# Patient Record
Sex: Male | Born: 1960 | Race: Black or African American | Hispanic: No
Health system: Midwestern US, Community
[De-identification: ages and names within clinical notes are randomized; demographics above are authoritative.]

## PROBLEM LIST (undated history)

## (undated) DIAGNOSIS — K52832 Lymphocytic colitis: Secondary | ICD-10-CM

## (undated) DIAGNOSIS — R197 Diarrhea, unspecified: Secondary | ICD-10-CM

## (undated) DIAGNOSIS — R7989 Other specified abnormal findings of blood chemistry: Secondary | ICD-10-CM

## (undated) DIAGNOSIS — F1010 Alcohol abuse, uncomplicated: Secondary | ICD-10-CM

## (undated) DIAGNOSIS — K389 Disease of appendix, unspecified: Secondary | ICD-10-CM

## (undated) DIAGNOSIS — M109 Gout, unspecified: Secondary | ICD-10-CM

## (undated) DIAGNOSIS — L730 Acne keloid: Secondary | ICD-10-CM

## (undated) DIAGNOSIS — R772 Abnormality of alphafetoprotein: Secondary | ICD-10-CM

## (undated) DIAGNOSIS — G4730 Sleep apnea, unspecified: Secondary | ICD-10-CM

## (undated) DIAGNOSIS — R739 Hyperglycemia, unspecified: Principal | ICD-10-CM

## (undated) DIAGNOSIS — C61 Malignant neoplasm of prostate: Principal | ICD-10-CM

## (undated) DIAGNOSIS — E559 Vitamin D deficiency, unspecified: Secondary | ICD-10-CM

## (undated) DIAGNOSIS — E782 Mixed hyperlipidemia: Secondary | ICD-10-CM

## (undated) DIAGNOSIS — K859 Acute pancreatitis without necrosis or infection, unspecified: Secondary | ICD-10-CM

## (undated) DIAGNOSIS — K449 Diaphragmatic hernia without obstruction or gangrene: Secondary | ICD-10-CM

## (undated) DIAGNOSIS — Z0000 Encounter for general adult medical examination without abnormal findings: Secondary | ICD-10-CM

## (undated) DIAGNOSIS — E785 Hyperlipidemia, unspecified: Secondary | ICD-10-CM

## (undated) DIAGNOSIS — K852 Alcohol induced acute pancreatitis without necrosis or infection: Secondary | ICD-10-CM

## (undated) DIAGNOSIS — I10 Essential (primary) hypertension: Secondary | ICD-10-CM

## (undated) DIAGNOSIS — R748 Abnormal levels of other serum enzymes: Secondary | ICD-10-CM

---

## 2008-12-29 LAB — CBC WITH MANUAL DIFFERENTIAL
Basophils %: 0.6 % (ref 0–3)
Basophils Absolute: 0 10*3/uL (ref 0.0–0.2)
Eosinophils %: 5.3 % (ref 0–7)
Eosinophils Absolute: 0.3 10*3/uL (ref 0.0–0.4)
Hematocrit: 42.1 % (ref 36–50)
Hemoglobin: 13.8 g/dL (ref 12.5–17.0)
Lymphocytes %: 38.1 % (ref 14–46)
Lymphocytes Absolute: 2.1 10*3/uL (ref 0.7–4.5)
MCH: 32 pg (ref 27–34)
MCHC: 33 g/dL (ref 32–36)
MCV: 98 fL (ref 80–98)
Monocytes %: 7.5 % (ref 4–13)
Monocytes Absolute: 0.4 10*3/uL (ref 0.1–1.0)
Neutrophils Absolute: 2.6 10*3/uL (ref 1.8–7.8)
Platelets: 250 10*3/uL (ref 140–415)
RBC: 4.32 x10E6/uL (ref 4.1–5.6)
RDW: 14.2 % (ref 11.7–15.0)
Segs Relative: 48.5 % (ref 40–74)
White Blood Cells: 5.4 10*3/uL (ref 4.0–10.5)

## 2008-12-29 LAB — COMPREHENSIVE METABOLIC PANEL
ALT: 46 U/L (ref 0–55)
AST: 52 U/L — ABNORMAL HIGH (ref 0–40)
Albumin/Globulin Ratio: 1.1 (ref 1.1–2.5)
Albumin: 4.3 g/dL (ref 3.5–5.5)
Alkaline Phosphatase: 59 U/L
BUN/Creatinine Ratio: 12 (ref 8–27)
BUN: 11 mg/dL (ref 5–26)
CO2: 19 mmol/L — ABNORMAL LOW (ref 20–32)
Calcium: 8.6 mg/dL
Chloride: 103 mmol/L (ref 97–108)
Creatinine: 0.91 mg/dL (ref 0.76–1.27)
GFR, Estimated: 108.55 mL/min (ref >59)
GFR, Estimated: 89.56 mL/min
Globulin: 4 g/dL (ref 1.5–4.5)
Glucose: 84 mg/dL (ref 65–99)
Potassium: 4.4 mmol/L (ref 3.5–5.2)
Sodium: 136 mmol/L (ref 135–145)
Total Bilirubin: 0.2 mg/dL (ref 0.1–1.2)
Total Protein: 8.3 g/dL (ref 6.0–8.5)

## 2008-12-29 LAB — LIPID PANEL
Cholesterol, Total: 149 mg/dL (ref 100–199)
HDL: 47 mg/dL — ABNORMAL LOW
LDL Calculated: 75 mg/dL (ref 0.0–99.0)
Triglycerides: 135 mg/dL (ref 0–149)
VLDL Cholesterol Calculated: 27 mg/dL (ref 5.0–40.0)

## 2008-12-29 LAB — URINALYSIS
Bilirubin, Urine: NEGATIVE
Blood, Urine: NEGATIVE
Glucose, UA: NEGATIVE mg/dL
Ketones, Urine: NEGATIVE mg/dL
Leukocyte Esterase, Urine: NEGATIVE
Nitrite, Urine: NEGATIVE
Protein, UA: NEGATIVE mg/dL
Specific Gravity, UA: 1.025 (ref 1.005–1.03)
Urobilinogen, Urine: 0.2 mg/dL (ref 0–1)
pH, UA: 5.5 (ref 5.0–7.5)

## 2008-12-29 LAB — TSH: Thyrotropin: 1.92 u[IU]/mL (ref 0.450–4.50)

## 2008-12-29 LAB — EPSTEIN BARR VIRUS PCR

## 2008-12-29 LAB — PSA: PSA: 3.1 ng/mL (ref 0.0–4.0)

## 2009-01-03 ENCOUNTER — Encounter

## 2009-01-05 NOTE — Telephone Encounter (Signed)
error 

## 2009-03-15 NOTE — Assessment & Plan Note (Signed)
Continue current meds and return in 3 mo.

## 2009-03-15 NOTE — Assessment & Plan Note (Signed)
Continue current meds no sx

## 2009-03-15 NOTE — Assessment & Plan Note (Addendum)
BP up a little;e as is pulse EKG OK add metoprolol

## 2009-03-15 NOTE — Progress Notes (Signed)
Subjective:      Patient ID: Phillip Harper is a 48 y.o. male.    HPI Comments: Here today for complete physical and review of listed chronic problem  No new c/o     Hypertension    Hyperlipidemia        Review of Systems   Constitutional: Negative.    HENT: Negative.    Eyes: Negative.         Glasses   Respiratory: Negative.    Cardiovascular: Negative.    Gastrointestinal: Negative.    Genitourinary: Positive for urgency and frequency.   Musculoskeletal: Negative.         Gout is stable since taking allopurinol    Skin: Negative.    Neurological: Negative.    Psychiatric/Behavioral: Negative.        Objective:   Physical Exam   Constitutional: He is oriented to person, place, and time. He appears well-developed and well-nourished.   HENT:   Head: Normocephalic and atraumatic.   Right Ear: External ear normal.   Left Ear: External ear normal.   Nose: Nose normal.   Mouth/Throat: Oropharynx is clear and moist.   Eyes: Conjunctivae and extraocular motions are normal. Pupils are equal, round, and reactive to light.   Neck: Normal range of motion. Neck supple. No tracheal deviation present. No thyromegaly present.   Cardiovascular: Normal rate, regular rhythm, normal heart sounds and intact distal pulses.    No murmur heard.  Pulmonary/Chest: Effort normal and breath sounds normal.   Abdominal: Soft. Bowel sounds are normal.        Obese    Genitourinary: Rectum normal, prostate normal and penis normal. Guaiac negative stool.   Musculoskeletal: Normal range of motion.   Neurological: He is alert and oriented to person, place, and time. He has normal reflexes.   Skin: Skin is warm and dry.   Psychiatric: He has a normal mood and affect. His behavior is normal.       Assessment:              Plan:        Gout - Deloria Lair, MD  03/15/09 01:55 PM  Signed  Continue current meds no sx     HTN (HYPERTENSION) - Deloria Lair, MD  03/15/09 02:05 PM  Addended  BP up a little;e as is pulse EKG OK add metoprolol          HYPERLIPIDEMIA - Deloria Lair, MD  03/15/09 01:55 PM  Signed  Continue current meds and return in 3 mo.        Well Adult Exam - Deloria Lair, MD  03/15/09 01:55 PM  Signed  within normal limits for age

## 2009-03-15 NOTE — Assessment & Plan Note (Signed)
within normal limits for age

## 2009-05-02 NOTE — Telephone Encounter (Signed)
Patient informed that we are working on the PA for the Benicar 40

## 2009-05-02 NOTE — Telephone Encounter (Signed)
Having problems getting his BP rx filled, inhsurance wants him to try  Another drug instead of benecar 40mg .[facility] [phone]

## 2009-06-16 NOTE — Assessment & Plan Note (Signed)
Continue current meds and return in 3 mo.

## 2009-06-16 NOTE — Assessment & Plan Note (Signed)
4587*no symptoms Continue current meds

## 2009-06-16 NOTE — Assessment & Plan Note (Signed)
Continue current meds and return in 3 mo.    has been off benicar due to ins will change as noted

## 2009-06-16 NOTE — Progress Notes (Signed)
Subjective:      Patient ID: Phillip Harper is a 48 y.o. male.    HPI Comments: Here today for follow up of chronic problems see problem list, no new c/o feels good     Hypertension    Hyperlipidemia        Review of Systems   Constitutional: Positive for unexpected weight change (up a few # ). Negative for fatigue.   HENT: Negative.    Eyes: Negative.         Glasses   Respiratory: Negative.    Cardiovascular: Negative.    Gastrointestinal: Negative.    Genitourinary: Positive for frequency.   Musculoskeletal: Negative.         Gout is stable since taking allopurinol    Skin: Negative.    Neurological: Negative.    Psychiatric/Behavioral: Negative.        Objective:   Physical Exam   Constitutional: He is oriented to person, place, and time. He appears well-developed and well-nourished.   HENT:   Head: Normocephalic and atraumatic.   Nose: Nose normal.   Mouth/Throat: Oropharynx is clear and moist.   Eyes: Conjunctivae and extraocular motions are normal. Pupils are equal, round, and reactive to light.   Neck: Normal range of motion. Neck supple. No tracheal deviation present. No thyromegaly present.   Cardiovascular: Normal rate, regular rhythm, normal heart sounds and intact distal pulses.    No murmur heard.  Pulmonary/Chest: Effort normal and breath sounds normal.   Abdominal: Soft. Bowel sounds are normal.        Obese    Genitourinary: Rectum normal, prostate normal and penis normal. Guaiac negative stool.   Musculoskeletal: Normal range of motion.   Neurological: He is alert and oriented to person, place, and time. He has normal reflexes.   Skin: Skin is warm and dry.   Psychiatric: He has a normal mood and affect. His behavior is normal.       Assessment:              Plan:        Gout - Deloria Lair, MD  06/16/09 01:16 PM  Signed  4587*no symptoms Continue current meds     HTN (HYPERTENSION) - Deloria Lair, MD  06/16/09 01:17 PM  Signed  Continue current meds and return in 3 mo.    has been off  benicar due to ins will change as noted     HYPERLIPIDEMIA Deloria Lair, MD  06/16/09 01:17 PM  Signed  Continue current meds and return in 3 mo.

## 2009-09-13 LAB — CBC WITH AUTO DIFFERENTIAL
Basophils %: 0.5 % (ref 0–3)
Basophils Absolute: 0 10*3/uL (ref 0.0–0.2)
Eosinophils %: 1.9 % (ref 0–7)
Eosinophils Absolute: 0.1 10*3/uL (ref 0.0–0.4)
Hematocrit: 45.3 % (ref 36–50)
Hemoglobin: 14.9 g/dL (ref 12.5–17.0)
Lymphocytes %: 23.9 % (ref 14–46)
Lymphocytes Absolute: 1.5 10*3/uL (ref 0.7–4.5)
MCH: 33.1 pg (ref 27–34)
MCHC: 32.9 g/dL (ref 32–36)
MCV: 101 fL — ABNORMAL HIGH (ref 80–98)
Monocytes %: 8.1 % (ref 4–13)
Monocytes Absolute: 0.5 10*3/uL (ref 0.1–1.0)
Neutrophils Absolute: 4.2 10*3/uL (ref 1.8–7.8)
Platelets: 265 10*3/uL (ref 140–415)
RBC: 4.5 x10E6/uL (ref 4.1–5.6)
RDW: 13.5 % (ref 11.7–15.0)
Segs Relative: 65.4 % (ref 40–74)
White Blood Cells: 6.4 10*3/uL (ref 4.0–10.5)

## 2009-09-13 LAB — COMPREHENSIVE METABOLIC PANEL
ALT: 48 U/L (ref 0–55)
AST: 44 U/L — ABNORMAL HIGH (ref 0–40)
Albumin/Globulin Ratio: 1.2 (ref 1.1–2.5)
Albumin: 4.7 g/dL (ref 3.5–5.5)
Alkaline Phosphatase: 63 U/L (ref 25–150)
BUN/Creatinine Ratio: 13 (ref 8–27)
BUN: 12 mg/dL (ref 5–26)
CO2: 25 mmol/L (ref 20–32)
Calcium: 8.9 mg/dL (ref 8.7–10.2)
Chloride: 105 mmol/L (ref 97–108)
Creatinine: 0.94 mg/dL (ref 0.76–1.27)
GFR, Estimated: 104.26 mL/min (ref >59)
GFR, Estimated: 86.02 mL/min (ref >59)
Globulin: 3.9 g/dL (ref 1.5–4.5)
Glucose: 89 mg/dL (ref 65–99)
Potassium: 4.6 mmol/L (ref 3.5–5.2)
Sodium: 140 mmol/L (ref 135–145)
Total Bilirubin: 0.2 mg/dL (ref 0.1–1.2)
Total Protein: 8.6 g/dL — ABNORMAL HIGH (ref 6.0–8.5)

## 2009-09-13 LAB — LIPID PANEL
Cholesterol, Total: 168 mg/dL (ref 100–199)
HDL: 44 mg/dL — ABNORMAL LOW
LDL Calculated: 91 mg/dL (ref 0.0–99.0)
Triglycerides: 166 mg/dL — ABNORMAL HIGH (ref 0–149)
VLDL Cholesterol Calculated: 33 mg/dL (ref 5.0–40.0)

## 2009-09-13 LAB — TSH: Thyrotropin: 2.41 u[IU]/mL (ref 0.450–4.50)

## 2009-09-14 LAB — VITAMIN D 25 HYDROXY: Vit D, 25-Hydroxy: 4 ng/mL — ABNORMAL LOW (ref 32.0–100.0)

## 2009-09-15 NOTE — Assessment & Plan Note (Signed)
Continue current meds and return in 3 mo.

## 2009-09-15 NOTE — Assessment & Plan Note (Signed)
No change no c/o

## 2009-09-15 NOTE — Progress Notes (Signed)
Subjective:      Patient ID: Phillip Harper is a 48 y.o. male.    HPI Comments: Here today for follow up of chronic problems see problem list, no new c/o feels good     Hypertension  Pertinent negatives include no fatigue.   Hyperlipidemia        Review of Systems   Constitutional: Positive for unexpected weight change (down 4#). Negative for fatigue.   HENT: Negative.    Eyes: Negative.         Glasses   Respiratory: Negative.    Cardiovascular: Negative.    Gastrointestinal: Negative.    Genitourinary: Positive for frequency.   Musculoskeletal: Negative.         Gout is stable since taking allopurinol    Skin: Negative.    Neurological: Negative.    Psychiatric/Behavioral: Negative.        Objective:   Physical Exam   Constitutional: He is oriented to person, place, and time. He appears well-developed and well-nourished.   HENT:   Head: Normocephalic and atraumatic.   Right Ear: External ear normal.   Left Ear: External ear normal.   Nose: Nose normal.   Mouth/Throat: Oropharynx is clear and moist.   Eyes: Conjunctivae and extraocular motions are normal. Pupils are equal, round, and reactive to light.   Neck: Normal range of motion. Neck supple. No tracheal deviation present. No thyromegaly present.   Cardiovascular: Normal rate, regular rhythm, normal heart sounds and intact distal pulses.    No murmur heard.  Pulmonary/Chest: Effort normal and breath sounds normal.   Abdominal: Soft. Bowel sounds are normal.        Obese    Musculoskeletal: Normal range of motion.   Neurological: He is alert and oriented to person, place, and time. He has normal reflexes.   Skin: Skin is warm and dry.       Assessment:              Plan:        Gout - Deloria Lair, MD  09/15/09 01:13 PM  Signed  No change no c/o     HTN (HYPERTENSION) - Deloria Lair, MD  09/15/09 01:13 PM  Signed  Continue current meds and return in 3 mo.        HYPERLIPIDEMIA Deloria Lair, MD  09/15/09 01:13 PM  Signed  Continue current meds and return  in 3 mo.        Vitamin D deficiency - Deloria Lair, MD  09/15/09 01:13 PM  Signed   vitamin D is low Tx with Vit. D 50,000  Units weekly x12 then repeat labs

## 2009-09-15 NOTE — Assessment & Plan Note (Signed)
vitamin D is low Tx with Vit. D 50,000  Units weekly x12 then repeat labs

## 2009-12-11 NOTE — Assessment & Plan Note (Signed)
Finished 50,00 check level Tx as needed

## 2009-12-11 NOTE — Assessment & Plan Note (Signed)
Continue current meds and return in 3 mo.

## 2009-12-11 NOTE — Progress Notes (Signed)
Subjective:      Patient ID: Phillip Harper is a 49 y.o. male.    HPI Comments: Here today for follow up of chronic problems see problem list, no new c/o feels good     Hypertension    Hyperlipidemia        Review of Systems   Constitutional: Negative for fatigue and unexpected weight change.   HENT: Negative.    Eyes: Negative.         [glasses  Respiratory: Negative.    Cardiovascular: Negative.    Gastrointestinal: Negative.    Genitourinary: Positive for frequency.   Musculoskeletal: Positive for back pain.        [Gout is stable since taking allopurinol occ symptoms   Skin: Negative.    Neurological: Negative.    Psychiatric/Behavioral: Negative.        Objective:   Physical Exam   Constitutional: He is oriented to person, place, and time. He appears well-developed and well-nourished.   HENT:   Head: Normocephalic and atraumatic.   Right Ear: External ear normal.   Left Ear: External ear normal.   Nose: Nose normal.   Mouth/Throat: Oropharynx is clear and moist.   Eyes: Conjunctivae and EOM are normal. Pupils are equal, round, and reactive to light.   Neck: Normal range of motion. Neck supple. No tracheal deviation present. No thyromegaly present.   Cardiovascular: Normal rate, regular rhythm, normal heart sounds and intact distal pulses.    No murmur heard.  Pulmonary/Chest: Effort normal and breath sounds normal.   Abdominal: Soft. Bowel sounds are normal.        Obese    Musculoskeletal: Normal range of motion.   Neurological: He is alert and oriented to person, place, and time. He has normal reflexes.   Skin: Skin is warm and dry.       Assessment:              Plan:        Gout  occ symptoms prn meds as noted     HYPERLIPIDEMIA  Continue current meds and return in 3 mo.        HTN (HYPERTENSION)  Continue current meds and return in 3 mo.        Vitamin D deficiency  Finished 50,00 check level Tx as needed

## 2009-12-11 NOTE — Assessment & Plan Note (Signed)
occ symptoms prn meds as noted

## 2009-12-12 LAB — CBC WITH DIFFERENTIAL
Basophils %: 0.8 %
Basophils Absolute: 38 cells/uL (ref 0–200)
Eosinophils %: 4.3 %
Eosinophils Absolute: 206 cells/uL (ref 15–500)
Hematocrit: 41.6 % (ref 38.5–50.0)
Hemoglobin: 14.4 g/dL (ref 13.2–17.1)
Lymphocytes %: 31 %
Lymphocytes Absolute: 1488 cells/uL (ref 850–3900)
MCH: 34.4 pg — ABNORMAL HIGH (ref 27.0–33.0)
MCHC: 34.6 g/dL (ref 32.0–36.0)
MCV: 99.6 fL (ref 80.0–100.0)
Monocytes %: 7.8 %
Monocytes Absolute: 374 cells/uL (ref 200–950)
Neutrophils Absolute: 2693 cells/uL (ref 1500–7800)
Platelets: 194 10*3/uL (ref 140–400)
RBC: 4.18 10*6/uL — ABNORMAL LOW (ref 4.20–5.80)
RDW: 14.1 % (ref 11.0–15.0)
Segs Relative: 56.1 %
White Blood Cells: 4.8 10*3/uL (ref 3.8–10.8)

## 2009-12-12 LAB — COMPREHENSIVE METABOLIC PANEL
ALT: 46 U/L (ref 9–60)
AST: 72 U/L — ABNORMAL HIGH (ref 10–40)
Albumin/Globulin Ratio: 1.2 (calc) (ref 1.0–2.1)
Albumin: 4.3 g/dL (ref 3.6–5.1)
Alkaline Phosphatase: 58 U/L (ref 40–115)
BUN: 10 mg/dL (ref 7–25)
CO2: 24 mmol/L (ref 21–33)
Calcium: 8.9 mg/dL (ref 8.6–10.2)
Chloride: 108 mmol/L (ref 98–110)
Creatinine: 0.95 mg/dL (ref 0.78–1.34)
Est, Glom Filt Rate: 95 mL/min/{1.73_m2} (ref >=60)
Globulin: 3.7 g/dL (calc) (ref 2.1–3.7)
Glucose: 89 mg/dL (ref 65–99)
Potassium: 4.4 mmol/L (ref 3.5–5.3)
Sodium: 140 mmol/L (ref 135–146)
Total Bilirubin: 0.5 mg/dL (ref 0.2–1.2)
Total Protein: 8 g/dL (ref 6.2–8.3)
eGFR African American: 110 mL/min/{1.73_m2} (ref >=60)

## 2009-12-12 LAB — VITAMIN D 25 HYDROXY
25-Hydroxy Vitamin D-2: 39 ng/mL
25-Hydroxy Vitamin D-3: <4 ng/mL
Vit D, 25-Hydroxy: 39 ng/mL (ref 30–100)

## 2009-12-12 LAB — LIPID PANEL
Chol/HDL Ratio: 2.8 (calc) (ref <=5.0)
Cholesterol, Total: 121 mg/dL — ABNORMAL LOW (ref 125–200)
HDL: 44 mg/dL (ref >=40)
LDL Calculated: 45 mg/dL (calc) (ref <130)
Triglycerides: 161 mg/dL — ABNORMAL HIGH (ref <150)

## 2010-03-12 NOTE — Assessment & Plan Note (Signed)
Will check with cpe

## 2010-03-12 NOTE — Assessment & Plan Note (Signed)
Continue current meds and return in 3 mo.    cpe

## 2010-03-12 NOTE — Progress Notes (Signed)
Subjective:      Patient ID: Phillip Harper is a 49 y.o. male.    HPI Comments: Here today for follow up of chronic problems see problem list, no new c/o feels good     Hypertension    Hyperlipidemia        Review of Systems   Constitutional: Negative for fatigue and unexpected weight change (up a few # ).   HENT: Negative.    Eyes: Negative.         [glasses  Respiratory: Negative.    Cardiovascular: Negative.    Gastrointestinal: Negative.    Genitourinary: Positive for frequency.   Musculoskeletal: Positive for back pain.        [Gout is stable since taking allopurinol occ symptoms   Skin: Negative.    Neurological: Negative.    Psychiatric/Behavioral: Negative.        Objective:   Physical Exam   Constitutional: He is oriented to person, place, and time. He appears well-developed and well-nourished.   HENT:   Head: Normocephalic and atraumatic.   Right Ear: External ear normal.   Left Ear: External ear normal.   Nose: Nose normal.   Mouth/Throat: Oropharynx is clear and moist.   Eyes: Conjunctivae and EOM are normal. Pupils are equal, round, and reactive to light.   Neck: Normal range of motion. Neck supple. No tracheal deviation present. No thyromegaly present.   Cardiovascular: Normal rate, regular rhythm, normal heart sounds and intact distal pulses.    No murmur heard.  Pulmonary/Chest: Effort normal and breath sounds normal.   Abdominal:             Musculoskeletal: Normal range of motion.   Neurological: He is alert and oriented to person, place, and time. He has normal reflexes.   Skin: Skin is warm and dry.       Assessment:        Gout  No recent attacks Continue current meds     HTN (HYPERTENSION)  Continue current meds and return in 3 mo.    cpe     HYPERLIPIDEMIA  Continue current meds and return in 3 mo.    cpe     Vitamin D deficiency  Will check with cpe              Plan:

## 2010-03-12 NOTE — Assessment & Plan Note (Signed)
No recent attacks Continue current meds

## 2010-06-12 LAB — COMPREHENSIVE METABOLIC PANEL
ALT: 62 U/L — ABNORMAL HIGH (ref 10–40)
AST: 79 U/L — ABNORMAL HIGH (ref 15–37)
Albumin/Globulin Ratio: 1.2 (ref 1.1–2.2)
Albumin: 4.5 gm/dl (ref 3.4–5.0)
Alkaline Phosphatase: 70 U/L (ref 45–129)
BUN: 11 mg/dl (ref 7–18)
CO2: 24 mEq/L (ref 21–32)
Calcium: 9.1 mg/dl (ref 8.3–10.6)
Chloride: 106 mEq/L (ref 99–110)
Creatinine: 0.9 mg/dl (ref 0.9–1.3)
GFR Est, African/Amer: >60
GFR, Estimated: >60 (ref >60)
Glucose: 89 mg/dl (ref 70–99)
Potassium: 4.3 mEq/L (ref 3.5–5.1)
Sodium: 140 mEq/L (ref 136–145)
Total Bilirubin: 0.3 mg/dl (ref 0.0–1.0)
Total Protein: 8.3 gm/dl — ABNORMAL HIGH (ref 6.4–8.2)

## 2010-06-12 LAB — URINALYSIS
Bilirubin, Urine: NEGATIVE
Blood, Urine: NEGATIVE
Glucose, UA: NEGATIVE mg/dl
Ketones, Urine: NEGATIVE mg/dl
Nitrite, Urine: NEGATIVE
Protein, UA: NEGATIVE mg/dl
Specific Gravity, UA: 1.025 (ref 1.005–1.030)
Urobilinogen, Urine: 0.2 EU/dl (ref <2.0)
pH, UA: 5.5 (ref 4.5–8.0)

## 2010-06-12 LAB — LIPID PANEL
Cholesterol, Total: 118 mg/dl (ref <200)
HDL: 43 mg/dl (ref 40–60)
LDL Calculated: 48 mg/dl (ref <100)
Triglycerides: 133 mg/dl (ref <150)
VLDL Cholesterol Calculated: 27 mg/dl

## 2010-06-12 LAB — CBC
Hematocrit: 38.8 % — ABNORMAL LOW (ref 40.5–52.5)
Hemoglobin: 13.4 gm/dl — ABNORMAL LOW (ref 13.5–17.5)
MCH: 34.8 pg — ABNORMAL HIGH (ref 26–34)
MCHC: 34.5 gm/dl (ref 31–36)
MCV: 100.8 fl — ABNORMAL HIGH (ref 80–100)
MPV: 8.2 fl (ref 5.0–10.5)
Platelets: 213 10*3 (ref 135–450)
RBC: 3.85 10*6 — ABNORMAL LOW (ref 4.2–5.9)
RDW: 13.8 % (ref 11.5–14.5)
WBC: 6.4 10*3 (ref 4.0–11.0)

## 2010-06-12 LAB — TSH: TSH: 2.77 u[IU]/mL (ref 0.35–5.5)

## 2010-06-12 LAB — PSA PROSTATIC SPECIFIC ANTIGEN: PSA: 2.84 ng/ml

## 2010-06-12 NOTE — Progress Notes (Signed)
Subjective:      Patient ID: Phillip Harper is a 49 y.o. male.    HPI Comments: Here today for complete physical and review of listed chronic problem       Review of Systems   Constitutional: Positive for unexpected weight change (up9#).   HENT: Negative.    Eyes: Negative.         Glasses   Respiratory: Positive for shortness of breath (mild DOE).    Cardiovascular: Negative.    Gastrointestinal: Negative.    Genitourinary: Positive for urgency and frequency.   Musculoskeletal: Negative.         Gout is stable since taking allopurinol    Skin: Negative.    Neurological: Negative.    Hematological: Negative.    Psychiatric/Behavioral: Negative.        Objective:   Physical Exam   Constitutional: He is oriented to person, place, and time. He appears well-developed and well-nourished.   HENT:   Head: Normocephalic and atraumatic.   Right Ear: External ear normal.   Left Ear: External ear normal.   Nose: Nose normal.   Mouth/Throat: Oropharynx is clear and moist.   Eyes: Conjunctivae and EOM are normal. Pupils are equal, round, and reactive to light.   Neck: Normal range of motion. Neck supple. No tracheal deviation present. No thyromegaly present.   Cardiovascular: Normal rate, regular rhythm, normal heart sounds and intact distal pulses.    No murmur heard.  Pulmonary/Chest: Effort normal and breath sounds normal.   Abdominal: Soft. Bowel sounds are normal.        Obese    Genitourinary: Rectum normal, prostate normal and penis normal. Guaiac negative stool.   Musculoskeletal: Normal range of motion.   Neurological: He is alert and oriented to person, place, and time. He has normal reflexes.   Skin: Skin is warm and dry.   Psychiatric: He has a normal mood and affect. His behavior is normal.       Assessment:              Plan:        Vitamin D deficiency  Stable otc med    HYPERLIPIDEMIA  Continue current meds and return in 3 mo.        HTN (HYPERTENSION)  Continue current meds and return in 3 mo.         Gout  Continue current meds no recent attacks

## 2010-06-12 NOTE — Assessment & Plan Note (Signed)
Continue current meds and return in 3 mo.

## 2010-06-12 NOTE — Assessment & Plan Note (Signed)
Stable otc med

## 2010-06-12 NOTE — Assessment & Plan Note (Signed)
Continue current meds no recent attacks

## 2010-06-13 LAB — VITAMIN D 25 HYDROXY: Vit D, 25-Hydroxy: 12 ng/ml — ABNORMAL LOW (ref 30–80)

## 2010-06-18 MED ORDER — VITAMIN D (ERGOCALCIFEROL) 1.25 MG (50000 UT) PO CAPS
1.25 MG (50000 UT) | ORAL_CAPSULE | ORAL | Status: DC
Start: 2010-06-18 — End: 2010-11-07

## 2010-07-20 MED ORDER — LISINOPRIL 20 MG PO TABS
20 MG | ORAL_TABLET | ORAL | Status: DC
Start: 2010-07-20 — End: 2010-08-02

## 2010-07-26 ENCOUNTER — Inpatient Hospital Stay
Admission: EM | Admit: 2010-07-26 | Disposition: A | Discharge status: Home / Self Care | Source: Home / Self Care | Attending: Emergency Medicine | Admitting: Emergency Medicine

## 2010-07-26 DIAGNOSIS — K852 Alcohol induced acute pancreatitis without necrosis or infection: Secondary | ICD-10-CM

## 2010-07-26 LAB — COMPREHENSIVE METABOLIC PANEL
1/Creatinine: 1.18 ratio
ALT: 232 U/L — ABNORMAL HIGH (ref 9–60)
AST: 309 U/L — ABNORMAL HIGH (ref 17–59)
Albumin: 4.9 g/dL (ref 3.6–5.1)
Alkaline Phosphatase: 143 U/L — ABNORMAL HIGH (ref 40–115)
Anion Gap: 19 mmol/L — ABNORMAL HIGH (ref 3–16)
BUN: 14 mg/dL (ref 7–25)
CO2: 22 mmol/L (ref 21–33)
Calcium: 9.6 mg/dL (ref 8.6–10.2)
Chloride: 104 mmol/L (ref 98–110)
Creatinine: 0.85 mg/dL (ref 0.50–1.30)
GFR Est, African/Amer: 116 See note.
GFR Est: 96 See note.
Glucose: 165 mg/dL — ABNORMAL HIGH (ref 65–99)
Potassium: 4.4 mmol/L (ref 3.5–5.3)
Sodium: 145 mmol/L (ref 135–146)
Total Bilirubin: 0.8 mg/dL (ref 0.2–1.2)
Total Protein: 9.1 g/dL — ABNORMAL HIGH (ref 6.2–8.3)

## 2010-07-26 LAB — CBC WITH AUTO DIFFERENTIAL
Basophils %: 0 % (ref 0.0–1.0)
Basophils Absolute: 5 /uL (ref 0–200)
Eosinophils %: 0 % (ref 0.0–8.0)
Eosinophils Absolute: 2 /uL — ABNORMAL LOW (ref 15–500)
Hematocrit: 44.1 % (ref 38.5–50.0)
Hemoglobin: 15.4 g/dL (ref 13.2–17.1)
Lymphocytes %: 3.8 % — ABNORMAL LOW (ref 15.0–45.0)
Lymphocytes Absolute: 483 /uL — ABNORMAL LOW (ref 850–3900)
MCH: 33.8 pg — ABNORMAL HIGH (ref 27.0–33.0)
MCHC: 35 g/dL (ref 32.0–36.0)
MCV: 96.3 fL (ref 80.0–100.0)
MPV: 8.5 fL (ref 7.5–11.5)
Monocytes %: 2.1 % (ref 0.0–12.0)
Monocytes Absolute: 271 /uL (ref 200–950)
Neutrophils Absolute: 11904 /uL — ABNORMAL HIGH (ref 1500–7800)
Platelets: 209 10*3/uL (ref 140–400)
RBC: 4.58 10*6/uL (ref 4.20–5.80)
RDW: 14.1 % (ref 11.0–15.0)
Segs Relative: 94.1 % — ABNORMAL HIGH (ref 40.0–80.0)
WBC: 12.6 10*3/uL — ABNORMAL HIGH (ref 3.8–10.8)

## 2010-07-26 LAB — POCT CALCIUM IONIZED: POC Ionized Calcium: 4.5 mg/dL (ref 4.5–5.3)

## 2010-07-26 LAB — HEPATIC FUNCTION PANEL
ALT: 230 U/L — ABNORMAL HIGH (ref 9–60)
AST: 309 U/L — ABNORMAL HIGH (ref 17–59)
Albumin: 5 g/dL (ref 3.6–5.1)
Alkaline Phosphatase: 148 U/L — ABNORMAL HIGH (ref 40–115)
Bilirubin, Direct: 0 mg/dL (ref 0.0–0.2)
Total Bilirubin: 0.8 mg/dL (ref 0.2–1.2)
Total Protein: 9.3 g/dL — ABNORMAL HIGH (ref 6.2–8.3)

## 2010-07-26 LAB — POCT SODIUM: POC Sodium: 140 mmol/L (ref 135–146)

## 2010-07-26 LAB — POC ANION GAP: POC Anion Gap: 16 (ref 10–20)

## 2010-07-26 LAB — LIPASE: Lipase: 5024 U/L — ABNORMAL HIGH (ref 23–300)

## 2010-07-26 LAB — POCT POTASSIUM: POC Potassium: 3.9 mmol/L (ref 3.5–5.3)

## 2010-07-26 LAB — POC TOTAL CO2 MEASURED: POC TCO2 Measured: 23 mmol/L (ref 21–32)

## 2010-07-26 LAB — POCT GLUCOSE: POC Glucose: 169 mg/dL — ABNORMAL HIGH (ref 65–99)

## 2010-07-26 LAB — POCT CREATININE: POC Creatinine: 0.8 mg/dL (ref 0.6–1.3)

## 2010-07-26 LAB — POCT UREA (BUN): POC BUN: 12 mg/dL (ref 7–25)

## 2010-07-26 LAB — POCT CHLORIDE: POC Chloride: 106 mmol/L (ref 98–110)

## 2010-07-26 LAB — TRIGLYCERIDES: Triglycerides: 102 mg/dL (ref 30–150)

## 2010-07-26 MED ORDER — HYDROMORPHONE HCL 2 MG/ML IJ SOLN
2 MG/ML | Freq: Once | INTRAMUSCULAR | Status: AC
Start: 2010-07-26 — End: 2010-07-26
  Administered 2010-07-26: 19:00:00 via INTRAVENOUS

## 2010-07-26 MED ORDER — SODIUM CHLORIDE 0.9 % IV SOLN
0.9 % | INTRAVENOUS | Status: DC
Start: 2010-07-26 — End: 2010-07-26
  Administered 2010-07-26: 17:00:00 via INTRAVENOUS

## 2010-07-26 MED ORDER — ONDANSETRON HCL 4 MG/2ML IJ SOLN
4 MG/2ML | Freq: Once | INTRAMUSCULAR | Status: AC
Start: 2010-07-26 — End: 2010-07-26
  Administered 2010-07-26: 17:00:00 via INTRAVENOUS

## 2010-07-26 MED ORDER — FENTANYL CITRATE 0.05 MG/ML IJ SOLN
0.05 MG/ML | Freq: Once | INTRAMUSCULAR | Status: AC
Start: 2010-07-26 — End: 2010-07-26
  Administered 2010-07-26: 17:00:00 via INTRAVENOUS

## 2010-07-26 MED ADMIN — banana bag IV soln: INTRAVENOUS | @ 22:00:00 | NDC 63323018410

## 2010-07-26 MED ADMIN — 0.9%  NaCl infusion: INTRAVENOUS | NDC 00409798348

## 2010-07-26 MED ADMIN — allopurinol (ZYLOPRIM) tablet 300 mg: ORAL | @ 22:00:00 | NDC 51079020601

## 2010-07-26 MED ADMIN — metoprolol (TOPROL-XL) XL tablet 50 mg: ORAL | @ 22:00:00 | NDC 68084030411

## 2010-07-26 MED ADMIN — amlodipine (NORVASC) tablet 5 mg: ORAL | @ 22:00:00 | NDC 51079045101

## 2010-07-26 MED ADMIN — hydrALAZINE (APRESOLINE) tablet 25 mg: ORAL | NDC 63739012610

## 2010-07-26 MED ADMIN — morphine injection 2 mg: 2 mg | INTRAVENOUS | @ 22:00:00 | NDC 00409176230

## 2010-07-26 MED FILL — ONDANSETRON HCL 4 MG/2ML IJ SOLN: 4 MG/2ML | INTRAMUSCULAR | Qty: 2

## 2010-07-26 MED FILL — INDOMETHACIN 25 MG PO CAPS: 25 MG | ORAL | Qty: 1

## 2010-07-26 MED FILL — HYDRALAZINE HCL 25 MG PO TABS: 25 MG | ORAL | Qty: 1

## 2010-07-26 MED FILL — ALLOPURINOL 300 MG PO TABS: 300 MG | ORAL | Qty: 1

## 2010-07-26 MED FILL — FOLIC ACID 5 MG/ML IJ SOLN: 5 MG/ML | INTRAMUSCULAR | Qty: 0.2

## 2010-07-26 MED FILL — FENTANYL CITRATE 0.05 MG/ML IJ SOLN: 0.05 MG/ML | INTRAMUSCULAR | Qty: 2

## 2010-07-26 MED FILL — HYDROMORPHONE HCL 1 MG/ML IJ SOLN: 1 MG/ML | INTRAMUSCULAR | Qty: 2

## 2010-07-26 MED FILL — COLCHICINE 0.6 MG PO TABS: 0.6 MG | ORAL | Qty: 1

## 2010-07-26 MED FILL — MORPHINE SULFATE 2 MG/ML IJ SOLN: 2 mg/mL | INTRAMUSCULAR | Qty: 1

## 2010-07-26 MED FILL — AMLODIPINE BESYLATE 5 MG PO TABS: 5 MG | ORAL | Qty: 1

## 2010-07-26 NOTE — H&P (Addendum)
Department of Internal Medicine  General Internal Medicine  Resident History and Physical      CHIEF COMPLAINT:  Abdominal pain    Reason for Admission:  Pancreatitis    History Obtained From:  patient    HISTORY OF PRESENT ILLNESS:      The patient is a 49 y.o. male with significant past medical history of HTN, HLD and gout who presents with upper abdominal pain. Patient states that about 3 months ago he started having sensations of fullness and bloating but no pain. But last night after dinner he developed sharp aching pain in his epigastrium and in left upper quadrant. Pain was constant, 10/10, radiating to back without any aggravating or relieving factors. He also felt nauseated and vomited 5 times overnight. Vomitus contained gastric contents and straw colored fluid without blood. Didn't take anything since last night. Never had pancreatitis before.    Also complains of chills and sweats since the pain started. Does not have palpitations, tremors, chest pain or diarrhea. Denies cough, dizziness, headache or swelling of his legs. CAGE questionnaire is positive for 2. Drinks 6 packs of beer every other day. Last drink was 2 days ago. Never had alcohol withdrawal.    Past Medical History:        Diagnosis Date   ??? Gout    ??? Hyperlipidemia    ??? HTN (hypertension)    ??? Keloids 12/2003     multiple keloids on head   ??? Vitamin D deficiency      Past Surgical History:    History reviewed. No pertinent past surgical history.      Medications Prior to Admission:    Prescriptions prior to admission:lisinopril (PRINIVIL;ZESTRIL) 20 MG tablet, TAKE 1 TABLET BY MOUTH EVERY DAY vitamin D (ERGOCALCIFEROL) 50000 UNIT CAPS capsule, Take 1 capsule by mouth once a week. allopurinol (ZYLOPRIM) 300 MG tablet, TAKE 1 TABLET BY MOUTH EVERY DAY metoprolol (TOPROL-XL) 50 MG XL tablet, TAKE 1 TABLET BY MOUTH NIGHTLY amlodipine (NORVASC) 5 MG tablet, TAKE 1 TABLET BY MOUTH EVERY DAY  simvastatin (ZOCOR) 40 MG tablet, TAKE 1 TABLET BY  MOUTH AT BEDTIME colchicine 0.6 MG tablet, Take 0.6 mg by mouth daily. indomethacin (INDOCIN) 25 MG capsule, Take 25 mg by mouth 2 times daily (with meals).    Allergies:  Review of patient's allergies indicates no known allergies.    Social History:   Separated. Lives alone. 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 Drinks 6 packs of beer every 2 days. Does not smoke.    Family History:   Mother died, had pancreatitis 2/2 alcohol. Father had heart disease.    REVIEW OF SYSTEMS:  CONSTITUTIONAL:  positive for  chills and sweats  EYES:  negative  HEENT:  negative  RESPIRATORY:  negative for  cough with sputum and dyspnea  CARDIOVASCULAR:  negative for  palpitations  GASTROINTESTINAL:  positive for nausea, vomiting, abdominal pain and abdominal distention  GENITOURINARY:  negative  INTEGUMENT/BREAST:  negative  HEMATOLOGIC/LYMPHATIC:  negative  ENDOCRINE:  negative  MUSCULOSKELETAL:  negative for  joint swelling  NEUROLOGICAL:  negative  BEHAVIOR/PSYCH:  negative  PHYSICAL EXAM:    Vitals:  BP 144/88   Pulse 99   Temp(Src) 98.2 ??F (36.8 ??C) (Oral)   Resp 16   Ht 5\' 9"  (1.753 m)   Wt 270 lb (122.471 kg)   BMI 39.87 kg/m2   SpO2 93%    Physical Examination:   General appearance: Obese man wearing cap, lying in bed comfortably with  wife sitting next to him. Appears comfortabl without distress. IV infusing in right arm. Appears stated age.   Skin: Normal skin turgor. No rashes or leisions.   HEENT: Head: Normocephalic. Has large keloid at occipital region.   Throat: Mucous membranes look moist. Tongue normal with no coating.  Eye: PERLA, EOM intact. No pallor or scleral icterus.  Neck: no JVD.   Lungs: CTA bilaterally, Air entry equal, no wheezes or crackles.  Heart: RRR, S1, S2 normal. No added sounds.   Abdomen: Soft but distended. Some tenderness in epigastric region an in left upper quadrant. No guarding. BS +ve. No cullen's or gary turners sign.  Extremities: extremities normal, atraumatic, no clubbing or edema. Peripheral  pulses normal.   Neurologic: Oriented x 3. Cooperative, alert. Cranial nerves II-XII intact. Gross motor and sensory system   intact.      DATA:  CBC with Differential:    Lab Results   Component Value Date    WBC 12.6 07/26/2010    RBC 4.58 07/26/2010    HGB 15.4 07/26/2010    HCT 44.1 07/26/2010    PLT 209 07/26/2010    MCV 96.3 07/26/2010    MCH 33.8 07/26/2010    MCHC 35.0 07/26/2010    RDW 14.1 07/26/2010    NRBC CANCELED 12/06/2009    NRBC CANCELED 12/06/2009    SEGSPCT 94.1 07/26/2010    BANDSPCT CANCELED 12/06/2009    BLASTSPCT CANCELED 12/06/2009    METASPCT CANCELED 12/06/2009    LYMPHOPCT 3.8 07/26/2010    PROMYELOPCT CANCELED 12/06/2009    MONOPCT 2.1 07/26/2010    MYELOPCT CANCELED 12/06/2009    EOSPCT 0.0 07/26/2010    BASOPCT 0.0 07/26/2010    MONOSABS 271 07/26/2010    LYMPHSABS 483 07/26/2010    EOSABS 2 07/26/2010    BASOSABS 5 07/26/2010     BMP:    Lab Results   Component Value Date    NA 145 07/26/2010    K 4.4 07/26/2010    CL 104 07/26/2010    CO2 22 07/26/2010    BUN 14 07/26/2010    LABALBU 5.0 07/26/2010    LABALBU 4.9 07/26/2010    CREATININE 0.8 07/26/2010    CREATININE 0.85 07/26/2010    CALCIUM 9.6 07/26/2010    GFRAA 116 07/26/2010    LABGLOM >60 06/11/2010    LABGLOM 95 12/06/2009    GLUCOSE 165 07/26/2010    GLUCOSE 89 12/06/2009     Hepatic Function Panel:    Lab Results   Component Value Date    ALKPHOS 148 07/26/2010    ALKPHOS 143 07/26/2010    ALT 230 07/26/2010    ALT 232 07/26/2010    AST 309 07/26/2010    AST 309 07/26/2010    PROT 9.3 07/26/2010    PROT 9.1 07/26/2010    BILITOT 0.8 07/26/2010    BILITOT 0.8 07/26/2010    BILIDIR 0.0 07/26/2010    LABALBU 5.0 07/26/2010    LABALBU 4.9 07/26/2010     ASSESSMENT AND PLAN:      Pancreatitis likely 2/2 alcohol use:  This is his first episode of pancreatitis. Lipase is >5000. LFT's are consistent with alcohol transaminases. USG is normal ruling out gall stones as culprits. Triglyceride in November was normal. Calcium is also normal. Offending drug  which can cause pancreatitis is lisinopril. Low ranson's score.   - Will start fluids at 359ml/hr   - Analgesics with IV morphine   - NPO   -  Hold lisinopril   - Follow lipase tomorrow.    HTN:  Continue home medicines. Hold lisinopril because of the risk of flaring up pancreatitis.   - Would add hydralazine.    Gout:  Has history of gout. Would continue with allopurinol.    Alcohol abuse:  Patient was positive to 2 questions out of 4 for CAGE. Last drink was 2 days ago. Denies any signs of withdrawal.   - Would initiate CIWA protocol.   - Banana bag.    FEN: NS @ 36ml/hr  Diet: NPO    Santiago Glad MD.    Addendum to Resident H&P:   Pt seen,examined and evaluated with the medical resident team. I have reviewed the current history, physical findings, labs and assessment and plan and agree with note as documented    In addition, pt with elevated total protein levels in setting of acute pancreatitis.  Given this, will check IgG4 level to r/o autoimmune pancreatitis.    Patient Active Problem List   Diagnoses   ??? Gout   ??? Hyperlipidemia   ??? HTN (hypertension)   ??? Vitamin D deficiency   ??? Acute alcoholic pancreatitis   ??? Alcoholic hepatitis   ??? Alcohol abuse       Dr Grandville Silos

## 2010-07-26 NOTE — ED Notes (Signed)
To U/S    Phoebe PerchStephanie Puja Caffey, RN  07/26/10 1345

## 2010-07-26 NOTE — ED Notes (Signed)
Report verified by floor RN. Transported to room 5321 via stretcher by transporter . Patient in stable condition at time of transfer.     Phoebe PerchStephanie Cyrah Mclamb, RN  07/26/10 [phone]

## 2010-07-26 NOTE — ED Notes (Signed)
AOD at bedside    Phoebe PerchStephanie Clemence Stillings, RN  07/26/10 1441

## 2010-07-26 NOTE — ED Provider Notes (Signed)
HPI  Chief Complaint:    Chief Complaint   Patient presents with   ??? Abdominal Pain   ??? Emesis       Nursing Notes, Past Medical Hx, Past Surgical Hx, Social Hx, Allergies, and Family Hx were all reviewed and agreed with or any disagreements were addressed in the HPI.    HPI:    Phillip Harper is a 49 y.o. male Complains of upper abdominal pain. Patient states approximately a month to 3 months ago patient had early sensation with eating but no actual abdominal pain. He's had progressive symptoms to now although he has much discomfort in his epigastric abdominal region. He feels generally bloated with associated nausea and vomiting starting yesterday evening this morning. Pain is constant with a crescendo decrescendo component to it. He has had chills and sweats but is unaware of he's had fever associated with this. He denies any bloody or black stools of late. He's had no prior surgical intervention of his abdomen. He denies any chest pain pressure with same.    Review of Systems   Constitutional: Positive for chills and diaphoresis.   HENT: Negative.    Eyes: Negative.    Respiratory: Negative.    Cardiovascular: Negative.    Gastrointestinal: Positive for nausea, vomiting, abdominal pain and abdominal distention. Negative for diarrhea, constipation and blood in stool.   Genitourinary: Negative.    Musculoskeletal: Negative.    Skin: Negative.    Neurological: Negative.        Physical Exam   Nursing note and vitals reviewed.  Constitutional: He is oriented to person, place, and time. He appears well-developed and well-nourished. No distress.   HENT:   Head: Normocephalic and atraumatic.   Eyes: Conjunctivae and EOM are normal. Pupils are equal, round, and reactive to light.   Neck: Normal range of motion. Neck supple.   Cardiovascular: Normal rate, regular rhythm, normal heart sounds and intact distal pulses.    No murmur heard.  Pulmonary/Chest: Effort normal and breath sounds normal. He has no wheezes.    Abdominal: Soft. He exhibits distension. Tenderness is present. He has no rebound and no guarding.        Patient has diffuse upper abdominal discomfort with palpation. It is not localized to the right upper quadrant but seems to be more maximal epigastric right upper quadrant as opposed to the left abdomen. Because of his obesity I cannot discern any organomegaly. He has no tympani or tingling. Bowel sounds are hypoactive.   Musculoskeletal: Normal range of motion.   Lymphadenopathy:     He has no cervical adenopathy.   Neurological: He is alert and oriented to person, place, and time. No cranial nerve deficit.   Skin: Skin is warm and dry. No rash noted. He is not diaphoretic. No pallor.   Psychiatric: He has a normal mood and affect. His behavior is normal.       Procedures    MDM    MEDICAL DECISION MAKING    Vitals:  BP 185/91   Pulse 96   Temp(Src) 98.2 ??F (36.8 ??C) (Oral)   Resp 18   Ht 5\' 9"  (1.753 m)   Wt 270 lb (122.471 kg)   BMI 39.87 kg/m2   SpO2 97%    Labs:  Labs Reviewed   CBC WITH AUTO DIFFERENTIAL - Abnormal; Notable for the following:     WBC 12.6 (*)     MCH 33.8 (*)     Segs Relative 94.1 (*)  Lymphocytes Relative 3.8 (*)     Neutrophils Absolute 16109 (*)     Lymphocytes Absolute 483 (*)     Eosinophils Absolute 2 (*)     All other components within normal limits   HEPATIC FUNCTION PANEL - Abnormal; Notable for the following:     AST 309 (*)     ALT 230 (*)     Alkaline Phosphatase 148 (*)     Total Protein 9.3 (*)     All other components within normal limits   LIPASE - Abnormal; Notable for the following:     Lipase 5024 (*)     All other components within normal limits   POCT GLUCOSE - Abnormal; Notable for the following:     POC Glucose 169 (*)     All other components within normal limits   POCT CHEM BASIC W ICA   POCT CALCIUM IONIZED   POCT POTASSIUM   POCT UREA (BUN)   POC ANION GAP   POCT CHLORIDE   POC TOTAL CO2 MEASURED   POCT CREATININE   POCT SODIUM       Remainder of labs  reviewed and were negative at this time.    EKG:       X-RAYS:   XR ABD AP OBL CONE AND DECUB ERECT SUP W PA CHEST(ACUTE ABD SERIES)    Final Result:        US GALLBLADDER RUQ    (Results Pending)   US ABDOMINAL LIMITED    (Results Pending)       PROCEDURES: N/A    CRITICAL CARE:  Not applicable    CONSULTATIONS: hospitalist    MEDICAL DECISION MAKING / ED COURSE:    Patient presents with epigastric abdominal pain with associated nausea vomiting. He shows evidence of pancreatitis and later admits that he does drink alcohol. The amount or quantity he drinks on a daily basis is in dispute but he does not drink 12 ounces at a time it is a much larger can. Wife is "staying out of it" as to quantity. While waiting for a formal ultrasound I did a bedside ultrasound as a quick look which did not show any obvious gallstones.    The patient tolerated their visit well.  The patient and / or the family were informed of the results of any tests, a time was given to answer questions, a plan was proposed and they agreed with plan.      DISCHARGE DIAGNOSIS:  1. Acute pancreatitis        (Please note the MDM and HPI sections of this note were completed with a voice recognition program.  Efforts were made to edit the dictations but occasionally words are mis-transcribed.)        Wyn Forster, DO  07/26/10 1332

## 2010-07-26 NOTE — ED Notes (Signed)
Pt back from U/S    Phoebe PerchStephanie Aliece Honold, RN  07/26/10 1416

## 2010-07-26 NOTE — Progress Notes (Signed)
Nursing admission note  Data:  Patient admitted from ER with pancreatitis, abd pain.  On admit rated pain 5/10.  Up walking in room gait steady. Wife at bedside with patient.  Action:  Dr Tania Ade to see patient shortly after arrival.  Continue iv fluids, pain meds, and npo.  Patient and wife aware of treatment plan. Continue to monitor [employer]

## 2010-07-26 NOTE — ED Notes (Addendum)
Rounded on patient for pain, repositioning, and toileting needs. Call light and personal items within reach. Side rails up times 2.    Phoebe PerchStephanie Rafal Archuleta, RN  07/26/10 1255    Phoebe PerchStephanie Azriel Dancy, RN  07/26/10 1344

## 2010-07-27 LAB — HEPATIC FUNCTION PANEL
ALT: 121 U/L — ABNORMAL HIGH (ref 9–60)
AST: 96 U/L — ABNORMAL HIGH (ref 17–59)
Albumin: 3.7 g/dL (ref 3.6–5.1)
Alkaline Phosphatase: 91 U/L (ref 40–115)
Bilirubin, Direct: 0 mg/dL (ref 0.0–0.2)
Total Bilirubin: 1 mg/dL (ref 0.2–1.2)
Total Protein: 7.3 g/dL (ref 6.2–8.3)

## 2010-07-27 LAB — COMPREHENSIVE METABOLIC PANEL
1/Creatinine: 1.3 ratio
ALT: 121 U/L — ABNORMAL HIGH (ref 9–60)
AST: 96 U/L — ABNORMAL HIGH (ref 17–59)
Albumin: 3.7 g/dL (ref 3.6–5.1)
Alkaline Phosphatase: 91 U/L (ref 40–115)
Anion Gap: 14 mmol/L (ref 3–16)
BUN: 9 mg/dL (ref 7–25)
CO2: 22 mmol/L (ref 21–33)
Calcium: 8 mg/dL — ABNORMAL LOW (ref 8.6–10.2)
Chloride: 108 mmol/L (ref 98–110)
Creatinine: 0.77 mg/dL (ref 0.50–1.30)
GFR Est, African/Amer: 130 See note.
GFR Est: 108 See note.
Glucose: 120 mg/dL — ABNORMAL HIGH (ref 65–99)
Potassium: 3.9 mmol/L (ref 3.5–5.3)
Sodium: 144 mmol/L (ref 135–146)
Total Bilirubin: 1 mg/dL (ref 0.2–1.2)
Total Protein: 7.3 g/dL (ref 6.2–8.3)

## 2010-07-27 LAB — CBC
Hematocrit: 42.3 % (ref 38.5–50.0)
Hemoglobin: 14.4 g/dL (ref 13.2–17.1)
MCH: 33.4 pg — ABNORMAL HIGH (ref 27.0–33.0)
MCHC: 34.1 g/dL (ref 32.0–36.0)
MCV: 98 fL (ref 80.0–100.0)
RBC: 4.32 10*6/uL (ref 4.20–5.80)
RDW: 13.8 % (ref 11.0–15.0)
WBC: 15.7 10*3/uL — ABNORMAL HIGH (ref 3.8–10.8)

## 2010-07-27 LAB — URINALYSIS
Bilirubin, Urine: NEGATIVE
Blood, Urine: NEGATIVE
Glucose, UA: NEGATIVE mg/dL
Ketones, Urine: 15 mg/dL — AB
Leukocyte Esterase, Urine: NEGATIVE
Nitrite, Urine: NEGATIVE
Specific Gravity, UA: >=1.03 (ref 1.005–1.035)
Urobilinogen, Urine: 0.2 EU/dL (ref 0.2–1.0)
pH, UA: 5.5 (ref 5.0–8.0)

## 2010-07-27 LAB — BLOOD GAS, ARTERIAL
%HBO2: 93.2 % — ABNORMAL LOW (ref 95.0–98.0)
Base Excess, Arterial: -1 mmol/L (ref <=3.0)
CO2 Content, Arterial: 23 mmol/L (ref 23–27)
Carboxyhgb, Arterial: 1 %
HCO3, Arterial: 22 mmol/L (ref 22–26)
Methemoglobin, Arterial: 0.6 % (ref 0.0–1.5)
Reduced Hemoglobin: 5.2 % — ABNORMAL HIGH (ref 0.0–5.0)
pCO2, Arterial: 33 mm Hg — ABNORMAL LOW (ref 35–45)
pH, Arterial: 7.44 (ref 7.35–7.45)
pO2, Arterial: 67 mm Hg — ABNORMAL LOW (ref 80–100)

## 2010-07-27 LAB — AMYLASE: Amylase: 244 U/L — ABNORMAL HIGH (ref 30–101)

## 2010-07-27 LAB — PROTIME-INR
INR: 1.1 (ref 0.9–1.1)
Protime: 14.2 seconds (ref 11.6–14.4)

## 2010-07-27 LAB — LIPASE: Lipase: 5308 U/L — ABNORMAL HIGH (ref 23–300)

## 2010-07-27 LAB — LACTATE DEHYDROGENASE: LD: 666 U/L — ABNORMAL HIGH (ref 313–618)

## 2010-07-27 LAB — IGG: IgG, Serum: 2174 mg/dL — ABNORMAL HIGH (ref 700–1600)

## 2010-07-27 LAB — LACTIC ACID: Lactate: 1 mmol/L (ref 0.5–2.2)

## 2010-07-27 MED ORDER — IOHEXOL 240 MG/ML IV SOLN
240 MG/ML | Freq: Once | INTRAVENOUS | Status: AC | PRN
Start: 2010-07-27 — End: 2010-07-27
  Administered 2010-07-27: 17:00:00 via ORAL

## 2010-07-27 MED ORDER — IOPAMIDOL 76 % IV SOLN
76 % | Freq: Once | INTRAVENOUS | Status: AC
Start: 2010-07-27 — End: 2010-07-27
  Administered 2010-07-27: 17:00:00 via INTRAVENOUS

## 2010-07-27 MED ORDER — PANTOPRAZOLE SODIUM 40 MG IV SOLR
40 MG | Freq: Every day | INTRAVENOUS | Status: DC
Start: 2010-07-27 — End: 2010-08-02
  Administered 2010-07-27 – 2010-08-02 (×7): via INTRAVENOUS

## 2010-07-27 MED ADMIN — hydrALAZINE (APRESOLINE) tablet 25 mg: ORAL | @ 04:00:00 | NDC 63739012610

## 2010-07-27 MED ADMIN — morphine injection 2 mg: INTRAVENOUS | @ 03:00:00 | NDC 00409176230

## 2010-07-27 MED ADMIN — 0.9%  NaCl infusion: INTRAVENOUS | @ 07:00:00 | NDC 00409798348

## 2010-07-27 MED ADMIN — banana bag IV soln: INTRAVENOUS | @ 15:00:00 | NDC 63323018410

## 2010-07-27 MED ADMIN — allopurinol (ZYLOPRIM) tablet 300 mg: ORAL | @ 14:00:00 | NDC 51079020601

## 2010-07-27 MED ADMIN — 0.9%  NaCl infusion: INTRAVENOUS | @ 11:00:00 | NDC 00409798348

## 2010-07-27 MED ADMIN — metoprolol (TOPROL-XL) XL tablet 50 mg: ORAL | @ 14:00:00 | NDC 68084030411

## 2010-07-27 MED ADMIN — 0.9%  NaCl infusion: INTRAVENOUS | @ 15:00:00 | NDC 00409798348

## 2010-07-27 MED ADMIN — bisacodyl (DULCOLAX) suppository 10 mg: RECTAL | @ 20:00:00 | NDC 00713010906

## 2010-07-27 MED ADMIN — hydrALAZINE (APRESOLINE) tablet 25 mg: ORAL | @ 12:00:00 | NDC 63739012610

## 2010-07-27 MED ADMIN — ondansetron (ZOFRAN) injection 4 mg: INTRAVENOUS | @ 15:00:00 | NDC 00409475503

## 2010-07-27 MED ADMIN — amlodipine (NORVASC) tablet 5 mg: ORAL | @ 14:00:00 | NDC 51079045101

## 2010-07-27 MED ADMIN — lactated ringers infusion: INTRAVENOUS | @ 23:00:00 | NDC 00409795348

## 2010-07-27 MED ADMIN — 0.9%  NaCl infusion: INTRAVENOUS | @ 04:00:00 | NDC 00409798348

## 2010-07-27 MED ADMIN — hydrALAZINE (APRESOLINE) tablet 25 mg: ORAL | @ 20:00:00 | NDC 63739012610

## 2010-07-27 MED ADMIN — morphine injection 2 mg: 2 mg | INTRAVENOUS | @ 15:00:00 | NDC 00409176230

## 2010-07-27 MED ADMIN — morphine injection 2 mg: 2 mg | INTRAVENOUS | @ 01:00:00 | NDC 00409176230

## 2010-07-27 MED ADMIN — morphine injection 2 mg: 2 mg | INTRAVENOUS | @ 05:00:00 | NDC 00409176230

## 2010-07-27 MED ADMIN — morphine injection 2 mg: 2 mg | INTRAVENOUS | @ 11:00:00 | NDC 00409176230

## 2010-07-27 MED ADMIN — acetaminophen (TYLENOL) tablet 650 mg: 650 mg | ORAL | @ 20:00:00 | NDC 50580050130

## 2010-07-27 MED FILL — ACETAMINOPHEN 325 MG PO TABS: 325 MG | ORAL | Qty: 2

## 2010-07-27 MED FILL — MORPHINE SULFATE 2 MG/ML IJ SOLN: 2 mg/mL | INTRAMUSCULAR | Qty: 1

## 2010-07-27 MED FILL — ONDANSETRON HCL 4 MG/2ML IJ SOLN: 4 MG/2ML | INTRAMUSCULAR | Qty: 2

## 2010-07-27 MED FILL — PROTONIX 40 MG IV SOLR: 40 MG | INTRAVENOUS | Qty: 40

## 2010-07-27 MED FILL — HYDRALAZINE HCL 25 MG PO TABS: 25 MG | ORAL | Qty: 1

## 2010-07-27 MED FILL — ALLOPURINOL 300 MG PO TABS: 300 MG | ORAL | Qty: 1

## 2010-07-27 MED FILL — TOPROL XL 50 MG PO TB24: 50 MG | ORAL | Qty: 1

## 2010-07-27 MED FILL — FOLIC ACID 5 MG/ML IJ SOLN: 5 MG/ML | INTRAMUSCULAR | Qty: 0.2

## 2010-07-27 MED FILL — AMLODIPINE BESYLATE 5 MG PO TABS: 5 MG | ORAL | Qty: 1

## 2010-07-27 MED FILL — BISAC-EVAC 10 MG RE SUPP: 10 MG | RECTAL | Qty: 1

## 2010-07-27 NOTE — Progress Notes (Signed)
Patient n/a for stat ABG in the bathroom

## 2010-07-27 NOTE — Consults (Signed)
General Surgery   Resident Consult Note      Chief Complaint: abdominal pain    History obtained from: patient    History of Present Illness :    Phillip Harper is a 49 y.o. male who presented to [facility] ER on 12/22 secondary to abdominal pain. Dull constant pain with exacerbations which are sharp and radiate to the back. Pain associated with N/V, pt had 5 episodes of NB emesis on Wednesday. Reports 67mo of early satiety and fullness, denies fever/chills, diarrhea/constipation. Surgery was consulted for pancreatitis     Past Medical History:        Diagnosis Date   ??? Gout    ??? Hyperlipidemia    ??? HTN (hypertension)    ??? Keloids 12/2003     multiple keloids on head   ??? Vitamin D deficiency        Past Surgical History:     History reviewed. No pertinent past surgical history.    Allergies:  Review of patient's allergies indicates no known allergies.    Medications:   Home Meds  No current facility-administered medications on file prior to encounter.     Current Outpatient Prescriptions on File Prior to Encounter   Medication Sig Dispense Refill   ??? lisinopril (PRINIVIL;ZESTRIL) 20 MG tablet TAKE 1 TABLET BY MOUTH EVERY DAY  90 tablet  2   ??? vitamin D (ERGOCALCIFEROL) 50000 UNIT CAPS capsule Take 1 capsule by mouth once a week.  12 capsule  0   ??? allopurinol (ZYLOPRIM) 300 MG tablet TAKE 1 TABLET BY MOUTH EVERY DAY  90 tablet  0   ??? metoprolol (TOPROL-XL) 50 MG XL tablet TAKE 1 TABLET BY MOUTH NIGHTLY  90 tablet  0   ??? amlodipine (NORVASC) 5 MG tablet TAKE 1 TABLET BY MOUTH EVERY DAY  90 tablet  1   ??? simvastatin (ZOCOR) 40 MG tablet TAKE 1 TABLET BY MOUTH AT BEDTIME  90 tablet  1   ??? colchicine 0.6 MG tablet Take 0.6 mg by mouth daily.       ??? indomethacin (INDOCIN) 25 MG capsule Take 25 mg by mouth 2 times daily (with meals).         Current Meds   HYDROmorphone (DILAUDID) injection 2 mg Q4H PRN   iohexol (OMNIPAQUE) injection 50 mL ONCE PRN   iopamidol (ISOVUE-370) 76 % injection 100 mL Once   bisacodyl (DULCOLAX)  suppository 10 mg PRN   pantoprazole (PROTONIX) injection 40 mg Daily   ondansetron (ZOFRAN) injection 4 mg Once   fentanyl (SUBLIMAZE) injection 50 mcg Once   HYDROmorphone (DILAUDID) injection 2 mg Once   0.9%  NaCl infusion Continuous   allopurinol (ZYLOPRIM) tablet 300 mg Daily   amlodipine (NORVASC) tablet 5 mg Daily   metoprolol (TOPROL-XL) XL tablet 50 mg Daily   vitamin D (ERGOCALCIFEROL) capsule 50,000 Units Weekly   sodium chloride (PF) 0.9 % injection 10 mL Q12H SCH   sodium chloride (PF) 0.9 % injection 10 mL PRN   acetaminophen (TYLENOL) tablet 650 mg Q4H PRN   ondansetron (ZOFRAN) injection 4 mg Q6H PRN   0.9%  NaCl infusion Continuous   banana bag IV soln Once   morphine injection 2 mg Q4H PRN   lorazepam (ATIVAN) tablet 1 mg Q1H PRN   lorazepam (ATIVAN) injection 1 mg Q1H PRN   lorazepam (ATIVAN) tablet 2 mg Q1H PRN   lorazepam (ATIVAN) injection 2 mg Q1H PRN   lorazepam (ATIVAN) tablet 3 mg Q1H  PRN   lorazepam (ATIVAN) injection 3 mg Q1H PRN   lorazepam (ATIVAN) injection 4 mg Q1H PRN   hydrALAZINE (APRESOLINE) tablet 25 mg Q6H SCH   banana bag IV soln Daily   morphine injection 2 mg Once       Family History:   Family History   Problem Relation Age of Onset   ??? Cancer Mother      lung CA   ??? Heart Disease Father      CAD   ??? Cancer Sister      lung CA       Social History:   TOBACCO:   reports that he has never smoked. He does not have any smokeless tobacco history on file.  ETOH:   reports that he drinks about 7.2 ounces of alcohol per week. 6pack of beer every 3days  DRUGS:   reports that he does not use illicit drugs.    Physical exam:       Filed Vitals:    07/27/10 1442   BP: 159/97   Pulse: 121   Temp: 101.1 ??F (38.4 ??C)   Resp: 20     CONSTITUTIONAL:  awake, alert, cooperative, no apparent distress  LUNGS:  CTAB, no R/R/W  CARDIOVASCULAR: RRR, no m/r/g  ABDOMEN:  Soft, obese, tenderness worse in epigastric/LUQ area, distended, +BS, -rebound, -guarding, - ecchymosis     Labs:    CBC: Recent  Labs   Basename 07/27/10 0558 07/26/10 1147    WBC 15.7* 12.6*    HGB 14.4 15.4    HCT 42.3 44.1    MCV 98.0 96.3    PLT COM 209     BMP: Recent Labs   Basename 07/27/10 0825 07/26/10 1200 07/26/10 1147    NA 144 -- 145    K 3.9 -- 4.4    CL 108 -- 104    CO2 22 -- 22    PHOS -- -- --    BUN 9 -- 14    CREATININE 0.77 0.8 0.85     PT/INR:   Recent Labs   Basename 07/27/10 0825    PROTIME 14.2    INR 1.1     APTT: No results found for this basename: APTT:3 in the last 72 hours  Liver Profile:  Lab Results   Component Value Date    AST 96 07/27/2010    AST 96 07/27/2010    ALT 121 07/27/2010    ALT 121 07/27/2010    BILIDIR 0.0 07/27/2010    BILITOT 1.0 07/27/2010    BILITOT 1.0 07/27/2010    ALKPHOS 91 07/27/2010    ALKPHOS 91 07/27/2010     Lab Results   Component Value Date    HDL 43 06/11/2010    HDL 44 12/06/2009    TRIG 102 07/26/2010     UA: Lab Results   Component Value Date    COLORU Yellow 07/27/2010    PHUR 5.5 07/27/2010    WBCUA 0-3 07/27/2010    MUCUS 2+ 06/11/2010    CLARITYU Clear 07/27/2010    SPECGRAV >=1.030 07/27/2010    BLOODU Negative 07/27/2010    GLUCOSEU Negative 07/27/2010       Imaging  U/S  IMPRESSION-   Compromised study as discussed above   Fatty infiltration of the liver^ incomplete visualization of the   Pancreas    CT abd/pelvis   IMPRESSION-   1. Acute focal pancreatitis predominately involving the tail.   2. Diffuse mesenteric abdominal ascites,  mild.   3. Small left pleural effusion and basilar subsegmental   atelectasis.  4. Pelvic ascites    Assessment/Plan:  49 y.o. male with alcoholic pancreatitis. Ranson's 1 on admission, with increasing WBC and lipase. CT without IV contrast does not indicate pancreatic necrosis at this time. Will recommend switching NS to LR, aggressive fluid resuscitation, close monitoring will add tele and continuous pulse ox. Will send ABG. Follow up Ranson's @48hrs , follow LFTs, CBC, amylase/lipase. If condition acutely worsens would consider attaining CT  with IV contrast and pancreatic protocol. Surgery team will follow along.     Thank you for the consult.    Rayburn Go, M.D.

## 2010-07-27 NOTE — Plan of Care (Signed)
Problem: Pain - Acute  Goal: Communication of presence of pain  Outcome: Ongoing  No C/O discomfort , abd round distended hypoactive bowel sounds, No BM after dulcolax suppository but passing flatus. Resting quietly in bed. Remains NPO, IVF as per order

## 2010-07-27 NOTE — Plan of Care (Signed)
Problem: Pain - Acute  Goal: Communication of presence of pain  Outcome: Ongoing  Up ad lib in room premed prior to going to CT with Morphine 2mg  IV . Abd round distended  Hypoactive bowel sounds, no c/o nausea, stated no bm for several days. NPO IVF as per order

## 2010-07-27 NOTE — Progress Notes (Signed)
Daily Progress Note  PGY-1    Admit Date: 07/26/2010  Hospital Day:  2                                                                                                         PCP: Deloria Lair, MD                                  SUBJECTIVE:   Interval Hx:    - States that he is considerably better then yesterday. Pain has reduced in intensity but it is more towards the left side.   - Had temperature last night but denies, cough, dysuria, sore throat or diarrhea.    - Wishes to have better pain medicine then morphine because it keeps wearing off in couple of hours.   - Lipase trended up. Abdomen is distended and tender.    MEDICATIONS:   Scheduled Meds:     ??? ondansetron  4 mg Intravenous Once   ??? fentanyl  50 mcg Intravenous Once   ??? HYDROmorphone  2 mg Intravenous Once   ??? allopurinol  300 mg Oral Daily   ??? amlodipine  5 mg Oral Daily   ??? metoprolol  50 mg Oral Daily   ??? vitamin D  50,000 Units Oral Weekly   ??? sodium chloride (PF)  10 mL Intravenous Q12H [facility]   ??? banana bag IV soln   Intravenous Once   ??? hydrALAZINE  25 mg Oral Q6H SCH   ??? banana bag IV soln   Intravenous Daily   ??? morphine  2 mg Intravenous Once      Continuous Infusions:     ??? sodium chloride 1,000 mL (07/27/10 0542)   ??? sodium chloride 300 mL/hr at 07/26/10 1544     PHYSICAL EXAM:       Vitals: BP 146/85   Pulse 105   Temp(Src) 101.1 ??F (38.4 ??C) (Oral)   Resp 20   Ht 5\' 9"  (1.753 m)   Wt 270 lb (122.471 kg)   BMI 39.87 kg/m2   SpO2 94%    I/O:    Intake/Output Summary (Last 24 hours) at 07/27/10 0935  Last data filed at 07/27/10 1610   Gross per 24 hour   Intake   3505 ml   Output    425 ml   Net   3080 ml       Physical Examination:   General appearance: In mild distress due to pain.   Skin: Mucous membranes still look dry.  HEENT: Head: Normocephalic, Atraumatic.  Eye: EOM intact. No pallor or scleral icterus.  Neck: no JVD.   Lungs: CTA bilaterally, Air entry equal, no wheezes or crackles.  Heart: RRR, S1, S2 normal. No added sounds.    Abdomen: Distended. Tender in epigastric and left upper quadrant. BS +ve  Extremities: extremities normal, no edema. Peripheral pulses normal.  Neurologic: Oriented x 3. Cooperative, alert. Cranial nerves II-XII intact. Gross motor and sensory system intact.  DATA:       Labs:  CBC:   Recent Labs   Basename 07/26/10 1147    WBC 12.6*    HGB 15.4    HCT 44.1    PLT 209       BMP: Recent Labs   Basename 07/26/10 1200 07/26/10 1147    NA -- 145    K -- 4.4    CL -- 104    CO2 -- 22    BUN -- 14    CREATININE 0.8 0.85    GLUCOSE -- 165*     LFT's: Recent Labs   Basename 07/26/10 1147    AST 309* 309*    ALT 230* 232*    ALB --    BILITOT 0.8 0.8    ALKPHOS 148* 143*     Troponin: No results found for this basename: TROPONINI:3 in the last 72 hours  BNP: No results found for this basename: BNP:3 in the last 72 hours  ABGs: No results found for this basename: PHART:2,PCO2ART:2,PO2ART:2 in the last 72 hours  INR:   Recent Labs   [facility] 07/27/10 0825    INR 1.1     Lipids: No results found for this basename: CHOL,HDL,LDLCALCU in the last 72 hours    U/A:No results found for this basename: NITRITE,COLORU,PHUR,LABCAST,WBCUA,RBCUA,MUCUS,TRICHOMONAS,YEAST,BACTERIA,CLARITYU,SPECGRAV,LEUKOCYTESUR,UROBILINOGEN,BILIRUBINUR,BLOODU,GLUCOSEU,KETONESU,AMORPHOUS in the last 72 hours     Rad:   EKG:   Echo:  Micro:     ASSESSMENT AND PLAN:   Phillip Harper is a 49 y.o. male, who was admitted with Acute alcoholic pancreatitis.     Pancreatitis likely 2/2 alcohol use:   Lipase trended upwards. Now 5308 from 5024. However, patient subjectively feels less pain then yesterday but clinically his belly is more distended and he had fever (101.0). Will get CT scan of the abdomen to rule out pancreatic necrosis. Did not drop 10% hematocrit from admission. BUN has improved.  - Continue fluids at 374ml/hr   - Add dialudid for pain  - Keep NPO   - CT scan of the abdomen.   - Add suppository for constipation.    Fever:  He had fever of 101.1.  Denies dysuria, diarrhea, cough, sore throat or rash. He could have pancreatic necrosis and maybe that's why lipase is increasing.   - CT of the abdomen.   - Check UA   - Blood cultures.    HTN:   Continue home medicines except for lisinopril because of the risk of flaring up of pancreatitis.   - Increase hydralazine if blood pressure increases.    Gout:   Has history of gout. Would continue with allopurinol.    - Hold indomethacin and colchicine.    Alcohol abuse:   Patient was positive to 2 questions out of 4 for CAGE. Last drink was 3 days ago. Denies any signs of withdrawal. Did not require any ativan.  - Would initiate CIWA protocol.   - Banana bag.    Code Status:Full Code  FEN: NPO  PPX: SCD  Disp: Inpatient  -----------------------------  Santiago Glad, M.D.   Pager: [phone]   07/27/2010 9:35 AM

## 2010-07-27 NOTE — Progress Notes (Signed)
Addendum to Resident Progress note:  Pt seen,examined and evaluated. I have reviewed the current history, physical findings, labs and assessment and plan and agree with note as documented by resident MD.    Acute/ severe ETOH induced pancreatitis w/ CT abd showing pancreatitis involving the tail w/ diffuse mesenteric ascites.  On IVF/ IV analgesics/ IV antiemetics and bowel rest.  Will consult surgery.    Phillip Harper

## 2010-07-27 NOTE — Plan of Care (Signed)
Problem: Pain - Acute  Goal: Communication of presence of pain  Outcome: Ongoing  Pt c/o abdominal pain 8/10.  Pt calls out for pain meds before they are due to be given.  MD made aware that pt states " pain medications aren't helping that much."  No changes made to prn pain meds.  Pt medicated with 2 mg morphine every 4 hours prn.  Pt observed resting quietly after pain meds given.  Will continue to monitor comfort.

## 2010-07-28 LAB — AMYLASE: Amylase: 121 U/L — ABNORMAL HIGH (ref 30–101)

## 2010-07-28 LAB — COMPREHENSIVE METABOLIC PANEL
1/Creatinine: 1.16 ratio
ALT: 71 U/L — ABNORMAL HIGH (ref 9–60)
AST: 53 U/L (ref 17–59)
Albumin: 2.9 g/dL — ABNORMAL LOW (ref 3.6–5.1)
Alkaline Phosphatase: 77 U/L (ref 40–115)
Anion Gap: 12 mmol/L (ref 3–16)
BUN: 9 mg/dL (ref 7–25)
CO2: 23 mmol/L (ref 21–33)
Calcium: 8.4 mg/dL — ABNORMAL LOW (ref 8.6–10.2)
Chloride: 106 mmol/L (ref 98–110)
Creatinine: 0.86 mg/dL (ref 0.50–1.30)
GFR Est, African/Amer: 115 See note.
GFR Est: 95 See note.
Glucose: 100 mg/dL — ABNORMAL HIGH (ref 65–99)
Potassium: 3.9 mmol/L (ref 3.5–5.3)
Sodium: 141 mmol/L (ref 135–146)
Total Bilirubin: 1.1 mg/dL (ref 0.2–1.2)
Total Protein: 6.3 g/dL (ref 6.2–8.3)

## 2010-07-28 LAB — CBC
Hematocrit: 36 % — ABNORMAL LOW (ref 38.5–50.0)
Hemoglobin: 12.4 g/dL — ABNORMAL LOW (ref 13.2–17.1)
MCH: 34 pg — ABNORMAL HIGH (ref 27.0–33.0)
MCHC: 34.6 g/dL (ref 32.0–36.0)
MCV: 98.2 fL (ref 80.0–100.0)
MPV: 8.4 fL (ref 7.5–11.5)
Platelets: 165 10*3/uL (ref 140–400)
RBC: 3.66 10*6/uL — ABNORMAL LOW (ref 4.20–5.80)
RDW: 14.3 % (ref 11.0–15.0)
WBC: 16.8 10*3/uL — ABNORMAL HIGH (ref 3.8–10.8)

## 2010-07-28 LAB — HEPATIC FUNCTION PANEL
ALT: 71 U/L — ABNORMAL HIGH (ref 9–60)
AST: 53 U/L (ref 17–59)
Albumin: 2.9 g/dL — ABNORMAL LOW (ref 3.6–5.1)
Alkaline Phosphatase: 77 U/L (ref 40–115)
Bilirubin, Direct: 0 mg/dL (ref 0.0–0.2)
Total Bilirubin: 1.1 mg/dL (ref 0.2–1.2)
Total Protein: 6.3 g/dL (ref 6.2–8.3)

## 2010-07-28 LAB — LIPASE: Lipase: 1736 U/L — ABNORMAL HIGH (ref 23–300)

## 2010-07-28 MED ADMIN — docusate sodium (COLACE) capsule 100 mg: ORAL | @ 16:00:00 | NDC 63739008910

## 2010-07-28 MED ADMIN — sodium chloride (PF) 0.9 % injection 10 mL: INTRAVENOUS | @ 14:00:00 | NDC 00409488810

## 2010-07-28 MED ADMIN — lactated ringers infusion: INTRAVENOUS | @ 19:00:00 | NDC 00409795348

## 2010-07-28 MED ADMIN — allopurinol (ZYLOPRIM) tablet 300 mg: ORAL | @ 14:00:00 | NDC 51079020601

## 2010-07-28 MED ADMIN — banana bag IV soln: INTRAVENOUS | @ 14:00:00 | NDC 63323018410

## 2010-07-28 MED ADMIN — hydrALAZINE (APRESOLINE) tablet 25 mg: ORAL | @ 12:00:00 | NDC 62584073311

## 2010-07-28 MED ADMIN — metoprolol (TOPROL-XL) XL tablet 50 mg: ORAL | @ 14:00:00 | NDC 68084030411

## 2010-07-28 MED ADMIN — hydrALAZINE (APRESOLINE) tablet 25 mg: ORAL | @ 17:00:00 | NDC 63739012610

## 2010-07-28 MED ADMIN — lactated ringers infusion: INTRAVENOUS | @ 09:00:00 | NDC 00409795348

## 2010-07-28 MED ADMIN — hydrALAZINE (APRESOLINE) tablet 25 mg: ORAL | @ 02:00:00 | NDC 63739012610

## 2010-07-28 MED ADMIN — imipenem-cilastatin (PRIMAXIN) 500 mg in sodium chloride 0.9 % 100 mL IVPB: INTRAVENOUS | @ 19:00:00 | NDC 00409798437

## 2010-07-28 MED ADMIN — hydrALAZINE (APRESOLINE) tablet 25 mg: ORAL | @ 23:00:00 | NDC 63739012610

## 2010-07-28 MED ADMIN — bisacodyl (DULCOLAX) suppository 10 mg: RECTAL | @ 14:00:00 | NDC 00713010906

## 2010-07-28 MED ADMIN — amlodipine (NORVASC) tablet 5 mg: ORAL | @ 14:00:00 | NDC 51079045101

## 2010-07-28 MED ADMIN — lactated ringers infusion: INTRAVENOUS | @ 16:00:00 | NDC 00409795348

## 2010-07-28 MED ADMIN — imipenem-cilastatin (PRIMAXIN) 500 mg in sodium chloride 0.9 % 100 mL IVPB: INTRAVENOUS | @ 23:00:00 | NDC 00409798437

## 2010-07-28 MED ADMIN — HYDROmorphone (DILAUDID) injection 2 mg: 2 mg | INTRAVENOUS | @ 16:00:00 | NDC 00409131230

## 2010-07-28 MED ADMIN — HYDROmorphone (DILAUDID) injection 2 mg: 2 mg | INTRAVENOUS | @ 22:00:00 | NDC 00409131230

## 2010-07-28 MED ADMIN — HYDROmorphone (DILAUDID) injection 2 mg: 2 mg | INTRAVENOUS | @ 09:00:00 | NDC 00409131230

## 2010-07-28 MED ADMIN — morphine injection 2 mg: 2 mg | INTRAVENOUS | @ 05:00:00 | NDC 00409176230

## 2010-07-28 MED ADMIN — acetaminophen (TYLENOL) tablet 650 mg: 650 mg | ORAL | @ 16:00:00 | NDC 50580050130

## 2010-07-28 MED FILL — TOPROL XL 50 MG PO TB24: 50 MG | ORAL | Qty: 1

## 2010-07-28 MED FILL — MORPHINE SULFATE 2 MG/ML IJ SOLN: 2 mg/mL | INTRAMUSCULAR | Qty: 1

## 2010-07-28 MED FILL — HYDROMORPHONE HCL 2 MG/ML IJ SOLN: 2 MG/ML | INTRAMUSCULAR | Qty: 1

## 2010-07-28 MED FILL — PRIMAXIN IV 500-500 MG IV SOLR: 500-500 MG | INTRAVENOUS | Qty: 500

## 2010-07-28 MED FILL — PROTONIX 40 MG IV SOLR: 40 MG | INTRAVENOUS | Qty: 40

## 2010-07-28 MED FILL — FOLIC ACID 5 MG/ML IJ SOLN: 5 MG/ML | INTRAMUSCULAR | Qty: 0.2

## 2010-07-28 MED FILL — BISAC-EVAC 10 MG RE SUPP: 10 MG | RECTAL | Qty: 1

## 2010-07-28 MED FILL — HYDRALAZINE HCL 25 MG PO TABS: 25 MG | ORAL | Qty: 1

## 2010-07-28 MED FILL — ACETAMINOPHEN 325 MG PO TABS: 325 MG | ORAL | Qty: 2

## 2010-07-28 MED FILL — AMLODIPINE BESYLATE 5 MG PO TABS: 5 MG | ORAL | Qty: 1

## 2010-07-28 MED FILL — ALLOPURINOL 300 MG PO TABS: 300 MG | ORAL | Qty: 1

## 2010-07-28 MED FILL — DOCUSATE SODIUM 100 MG PO CAPS: 100 MG | ORAL | Qty: 1

## 2010-07-28 NOTE — Plan of Care (Signed)
Problem: Pain - Acute  Goal: Communication of presence of pain  Outcome: Ongoing  Patient able to verbalize level of pain and discomfort, patient notifying nursing staff of increased levels of pain and discomfort, patient medicated with pain medication as ordered, see e-Mar. Patient with relief from pain medication, patient encouraged to call with any changes in levels of comfort, patient verbalized understanding of instructions.

## 2010-07-28 NOTE — Progress Notes (Signed)
EtOH pancreatitis.  One Ranson's.  Better now.    Wait till lipase resolves before starting diet.

## 2010-07-28 NOTE — Progress Notes (Signed)
Pt A&OX3, VSS, afebrile. Up ad lib, IVF cont as ordered. Dilaudid for pain, working effectively. Call light in reach, will cont to monitor for safety and pain per POC.

## 2010-07-28 NOTE — Plan of Care (Signed)
Problem: Pain - Acute  Goal: Communication of presence of pain  Outcome: Ongoing  C/o back pain abd round distended no c/o  Nausea, Md aware of elevated WBC   IVF / Abx as per order, Dilaudid 2mg  IV with good relief

## 2010-07-28 NOTE — Progress Notes (Signed)
Surgery Progress Note            SUBJECTIVE:    Pt  Is resting in bed, no apparent distress, answers are appropriate.      OBJECTIVE:   VITALS:  BP 141/50   Pulse 109   Temp(Src) 99.9 ??F (37.7 ??C) (Oral)   Resp 20   Ht 5\' 9"  (1.753 m)   Wt 270 lb (122.471 kg)   BMI 39.87 kg/m2   SpO2 94%    INTAKE/OUTPUT:  In: 1370 [I.V.:1370]  Out: 1175 [Urine:1175]       ??? lactated ringers 300 mL/hr at 07/28/10 0346                                    CONSTITUTIONAL:  awake and alert  LUNGS:  clear to auscultation, no crackles or wheezing  HEART: Regular rate and rhythm or S1S2 present  ABDOMEN:   normal bowel sounds, soft, non-distended,  Mildly tender in epigastric    Data:  CBC:   Recent Labs   [facility] 07/28/10 0557    WBC 16.8*    HGB 12.4*    HCT 36.0*    PLT 165     BMP:    Recent Labs   [facility] 07/28/10 0557    NA 141    K 3.9    CL 106    CO2 23    BUN 9    CREATININE 0.86    GLUCOSE 100*     Hepatic:   Recent Labs   [facility] 07/28/10 0557    AST 53 53    ALT 71* 71*    ALB --    BILITOT 1.1 1.1    ALKPHOS 77 77     Mag:    No results found for this basename: MG in the last 72 hours   Phos:   No results found for this basename: PHOS in the last 72 hours   INR:   Recent Labs   [facility] 07/27/10 0825    INR 1.1       Radiology Review:  Ct reviewed    ASSESSMENT AND PLAN:  49 y.o. male s/p ETOH pancreatitis  1. NPO  2. IVF 150 ml / hr  3. Follow labs     Colburn Asper  07/28/2010  7:07 AM

## 2010-07-28 NOTE — Progress Notes (Signed)
Daily Progress Note  PGY-1    Admit Date: 07/26/2010  Hospital Day:  3                                                                                                         PCP: Deloria Lair, MD                                  SUBJECTIVE:   Interval Hx:    - Doing much better only required pain medicine for his back pain not abdominal pain.   - Understands the importance of abstaining alcohol from now on.   - Complains of not having a bowel movement yet.   - Still had low grade fever. White blood cell count increasing.     - No signs of alcohol withdrawal. Did not require any ativan.    MEDICATIONS:   Scheduled Meds:       ??? iopamidol  100 mL Intravenous Once   ??? pantoprazole  40 mg Intravenous Daily   ??? ondansetron  4 mg Intravenous Once   ??? fentanyl  50 mcg Intravenous Once   ??? HYDROmorphone  2 mg Intravenous Once   ??? allopurinol  300 mg Oral Daily   ??? amlodipine  5 mg Oral Daily   ??? metoprolol  50 mg Oral Daily   ??? vitamin D  50,000 Units Oral Weekly   ??? sodium chloride (PF)  10 mL Intravenous Q12H [facility]   ??? banana bag IV soln   Intravenous Once   ??? hydrALAZINE  25 mg Oral Q6H SCH   ??? banana bag IV soln   Intravenous Daily   ??? morphine  2 mg Intravenous Once      Continuous Infusions:       ??? lactated ringers 300 mL/hr at 07/28/10 0346     PHYSICAL EXAM:       Vitals: BP 141/50   Pulse 109   Temp(Src) 99.9 ??F (37.7 ??C) (Oral)   Resp 20   Ht 5\' 9"  (1.753 m)   Wt 270 lb (122.471 kg)   BMI 39.87 kg/m2   SpO2 94%    I/O:      Intake/Output Summary (Last 24 hours) at 07/28/10 0953  Last data filed at 07/28/10 0417   Gross per 24 hour   Intake   1370 ml   Output   1575 ml   Net   -205 ml       Physical Examination:   General appearance: Comfortable in no distress.   Skin: Mucous membranes still look dry.  HEENT: Head: Normocephalic, Atraumatic.  Eye: EOM intact. No pallor or scleral icterus.  Neck: no JVD.   Lungs: CTA bilaterally, Air entry equal, no wheezes or crackles.  Heart: RRR, S1, S2 normal. No added  sounds.   Abdomen: Distended but not tender now. BS sluggish.  Extremities: extremities normal, no edema. Peripheral pulses normal.  Neurologic: Oriented x 3. Cooperative, alert. Cranial nerves II-XII intact. Gross motor  and sensory system intact.     DATA:       Labs:  CBC:   Recent Labs   Basename 07/28/10 0557 07/27/10 0558 07/26/10 1147    WBC 16.8* 15.7* 12.6*    HGB 12.4* 14.4 15.4    HCT 36.0* 42.3 44.1    PLT 165 COM 209       BMP: Recent Labs   Basename 07/28/10 0557 07/27/10 0825 07/26/10 1200 07/26/10 1147    NA 141 144 -- 145    K 3.9 3.9 -- 4.4    CL 106 108 -- 104    CO2 23 22 -- 22    BUN 9 9 -- 14    CREATININE 0.86 0.77 0.8 --    GLUCOSE 100* 120* -- 165*     LFT's: Recent Labs   Basename 07/28/10 0557 07/27/10 0825 07/26/10 1147    AST 53 53 96* 96* 309* 309*    ALT 71* 71* 121* 121* 230* 232*    ALB -- -- --    BILITOT 1.1 1.1 1.0 1.0 0.8 0.8    ALKPHOS 77 77 91 91 148* 143*     Troponin: No results found for this basename: TROPONINI:3 in the last 72 hours  BNP: No results found for this basename: BNP:3 in the last 72 hours  ABGs:   Recent Labs   Basename 07/27/10 1620    PHART 7.44    PCO2ART 33*    PO2ART 67*     INR:   Recent Labs   Basename 07/27/10 0825    INR 1.1     Lipids: No results found for this basename: CHOL,HDL,LDLCALCU in the last 72 hours    U/A:  Recent Labs   Basename 07/27/10 1220    NITRITE --    COLORU Yellow    PHUR 5.5    LABCAST --    WBCUA 0-3    RBCUA --    MUCUS --    TRICHOMONAS --    YEAST --    BACTERIA --    CLARITYU Clear    SPECGRAV >=1.030    LEUKOCYTESUR --    UROBILINOGEN --    BILIRUBINUR --    BLOODU Negative    GLUCOSEU Negative    KETONESU --    AMORPHOUS --        Rad:   EKG:   Echo:  Micro:     ASSESSMENT AND PLAN:   Phillip Harper is a 49 y.o. male, who was admitted with Acute alcoholic pancreatitis.     Pancreatitis likely 2/2 alcohol use: - improving.  CT scan did not show necrosis of the pancrease. Lipase has come down from 5300 to 1700 today with  significant improvement in abdominal pain. Patient's requirement for analgesics also decreased.    - Continue with fluids.    - Will feed once lipase normalizes.   - Surgery following.   - Keep NPO.    Fever: - improving.  Fever 99.9 today. Denies dysuria, diarrhea, cough, sore throat or rash. No pancreatic necrosis of CT scan but has moderate ascites. ?infected. Urinalysis is clear for infection. Blood cultures did not show any growth.     - Blood cultures.     - Follow.     Leukocytosis:  Not sure about the cause of leukocytosis. No signs of infection. UA and CXR are normal. No necrosis of pancrease on CT scan. Could be due to stress response. Another possibility could be  SBP ?   - Continue to monitor.   - Get CBC with diff    HTN: - stable.  Continue home medicines except for lisinopril because of the risk of flaring up of pancreatitis.   - Increase hydralazine if blood pressure increases.    Gout: - stable.  Has history of gout. Would continue with allopurinol.    - Keep indomethacin and colchicine held    Alcohol abuse:   Did not require any ativan. Denies palpitations, tremors, agitation, headache, sweating or jitteriness. Patient was positive to 2 questions out of 4 for CAGE. Last drink was 4 days ago.  - Would initiate CIWA protocol.   - Banana bag.  - counseling.    Constipation:  This could be due to use of narcotics. Also patient is NPO. On physical exam he has very sluggish bowel sounds. CT of the abdomen did not show any signs of obstruction.   - Bisacodyl suppository   - Colace daily.    Code Status:Full Code  FEN: NPO until lipase becomes normal.  PPX: SCD  Disp: Inpatient  -----------------------------  Santiago Glad, M.D.   Pager: [phone]   07/28/2010 9:53 AM

## 2010-07-28 NOTE — Progress Notes (Signed)
Addendum to Resident Progress note:  Pt seen,examined and evaluated. I have reviewed the current history, physical findings, labs and assessment and plan and agree with note as documented by resident MD.    Fever overnight/ tachycardic and acute leukocytosis sirs- ? Secondary to severe pancreatitis vs other.  Check pa/ lat cxr.  Initiate empiric abx.  For acute pancreatitis- cont aggressive hydration/ bowel rest and analgesics.  Surgery following.    Blondell Reveal Delbert Phenix

## 2010-07-29 LAB — HEPATIC FUNCTION PANEL
ALT: 61 U/L — ABNORMAL HIGH (ref 9–60)
AST: 46 U/L (ref 17–59)
Albumin: 3.4 g/dL — ABNORMAL LOW (ref 3.6–5.1)
Alkaline Phosphatase: 77 U/L (ref 40–115)
Bilirubin, Direct: 0.1 mg/dL (ref 0.0–0.2)
Total Bilirubin: 1.1 mg/dL (ref 0.2–1.2)
Total Protein: 7.1 g/dL (ref 6.2–8.3)

## 2010-07-29 LAB — COMPREHENSIVE METABOLIC PANEL
1/Creatinine: 1.25 ratio
ALT: 61 U/L — ABNORMAL HIGH (ref 9–60)
AST: 46 U/L (ref 17–59)
Albumin: 3.4 g/dL — ABNORMAL LOW (ref 3.6–5.1)
Alkaline Phosphatase: 77 U/L (ref 40–115)
Anion Gap: 17 mmol/L — ABNORMAL HIGH (ref 3–16)
BUN: 11 mg/dL (ref 7–25)
CO2: 20 mmol/L — ABNORMAL LOW (ref 21–33)
Calcium: 8.4 mg/dL — ABNORMAL LOW (ref 8.6–10.2)
Chloride: 106 mmol/L (ref 98–110)
Creatinine: 0.8 mg/dL (ref 0.50–1.30)
GFR Est, African/Amer: 125 See note.
GFR Est: 103 See note.
Glucose: 78 mg/dL (ref 65–99)
Potassium: 3.9 mmol/L (ref 3.5–5.3)
Sodium: 143 mmol/L (ref 135–146)
Total Bilirubin: 1.1 mg/dL (ref 0.2–1.2)
Total Protein: 7.1 g/dL (ref 6.2–8.3)

## 2010-07-29 LAB — CBC
Hematocrit: 36.2 % — ABNORMAL LOW (ref 38.5–50.0)
Hemoglobin: 12.6 g/dL — ABNORMAL LOW (ref 13.2–17.1)
MCH: 34.4 pg — ABNORMAL HIGH (ref 27.0–33.0)
MCHC: 34.8 g/dL (ref 32.0–36.0)
MCV: 98.8 fL (ref 80.0–100.0)
MPV: 8.7 fL (ref 7.5–11.5)
Platelets: 159 10*3/uL (ref 140–400)
RBC: 3.66 10*6/uL — ABNORMAL LOW (ref 4.20–5.80)
RDW: 13.7 % (ref 11.0–15.0)
WBC: 14.6 10*3/uL — ABNORMAL HIGH (ref 3.8–10.8)

## 2010-07-29 LAB — AMYLASE: Amylase: 47 U/L (ref 30–101)

## 2010-07-29 LAB — LIPASE: Lipase: 493 U/L — ABNORMAL HIGH (ref 23–300)

## 2010-07-29 MED ADMIN — imipenem-cilastatin (PRIMAXIN) 500 mg in sodium chloride 0.9 % 100 mL IVPB: INTRAVENOUS | @ 11:00:00 | NDC 00409798437

## 2010-07-29 MED ADMIN — imipenem-cilastatin (PRIMAXIN) 500 mg in sodium chloride 0.9 % 100 mL IVPB: INTRAVENOUS | @ 23:00:00 | NDC 00409798437

## 2010-07-29 MED ADMIN — amlodipine (NORVASC) tablet 5 mg: ORAL | @ 14:00:00 | NDC 51079045101

## 2010-07-29 MED ADMIN — lactated ringers infusion: INTRAVENOUS | @ 04:00:00 | NDC 00409795348

## 2010-07-29 MED ADMIN — docusate sodium (COLACE) capsule 100 mg: ORAL | @ 14:00:00 | NDC 63739008910

## 2010-07-29 MED ADMIN — hydrALAZINE (APRESOLINE) tablet 25 mg: ORAL | @ 23:00:00 | NDC 63739012610

## 2010-07-29 MED ADMIN — sodium chloride (PF) 0.9 % injection 10 mL: INTRAVENOUS | @ 14:00:00 | NDC 00409488810

## 2010-07-29 MED ADMIN — lactated ringers infusion: INTRAVENOUS | @ 11:00:00 | NDC 00409795348

## 2010-07-29 MED ADMIN — allopurinol (ZYLOPRIM) tablet 300 mg: ORAL | @ 14:00:00 | NDC 51079020601

## 2010-07-29 MED ADMIN — hydrALAZINE (APRESOLINE) tablet 25 mg: ORAL | @ 12:00:00 | NDC 63739012610

## 2010-07-29 MED ADMIN — imipenem-cilastatin (PRIMAXIN) 500 mg in sodium chloride 0.9 % 100 mL IVPB: INTRAVENOUS | @ 18:00:00 | NDC 00409798437

## 2010-07-29 MED ADMIN — banana bag IV soln: INTRAVENOUS | @ 15:00:00 | NDC 63323018410

## 2010-07-29 MED ADMIN — imipenem-cilastatin (PRIMAXIN) 500 mg in sodium chloride 0.9 % 100 mL IVPB: INTRAVENOUS | @ 05:00:00 | NDC 00409798437

## 2010-07-29 MED ADMIN — hydrALAZINE (APRESOLINE) tablet 25 mg: ORAL | @ 18:00:00 | NDC 63739012610

## 2010-07-29 MED ADMIN — metoprolol (TOPROL-XL) XL tablet 50 mg: ORAL | @ 14:00:00 | NDC 68084030411

## 2010-07-29 MED ADMIN — hydrALAZINE (APRESOLINE) tablet 25 mg: ORAL | @ 05:00:00 | NDC 63739012610

## 2010-07-29 MED ADMIN — HYDROmorphone (DILAUDID) injection 2 mg: 2 mg | INTRAVENOUS | @ 04:00:00 | NDC 00409131230

## 2010-07-29 MED ADMIN — HYDROmorphone (DILAUDID) injection 2 mg: 2 mg | INTRAVENOUS | @ 11:00:00 | NDC 00409131230

## 2010-07-29 MED ADMIN — HYDROmorphone (DILAUDID) injection 2 mg: 2 mg | INTRAVENOUS | @ 19:00:00 | NDC 00409131230

## 2010-07-29 MED ADMIN — HYDROmorphone (DILAUDID) injection 2 mg: 2 mg | INTRAVENOUS | @ 08:00:00 | NDC 00409131230

## 2010-07-29 MED ADMIN — acetaminophen (TYLENOL) tablet 650 mg: 650 mg | ORAL | @ 05:00:00 | NDC 50580050130

## 2010-07-29 MED ADMIN — acetaminophen (TYLENOL) tablet 650 mg: 650 mg | ORAL | @ 22:00:00 | NDC 50580050130

## 2010-07-29 MED FILL — DOCUSATE SODIUM 100 MG PO CAPS: 100 MG | ORAL | Qty: 1

## 2010-07-29 MED FILL — ACETAMINOPHEN 325 MG PO TABS: 325 MG | ORAL | Qty: 2

## 2010-07-29 MED FILL — HYDRALAZINE HCL 25 MG PO TABS: 25 MG | ORAL | Qty: 1

## 2010-07-29 MED FILL — METOPROLOL TARTRATE 1 MG/ML IV SOLN: 1 MG/ML | INTRAVENOUS | Qty: 5

## 2010-07-29 MED FILL — HYDROMORPHONE HCL 2 MG/ML IJ SOLN: 2 MG/ML | INTRAMUSCULAR | Qty: 1

## 2010-07-29 MED FILL — ALLOPURINOL 300 MG PO TABS: 300 MG | ORAL | Qty: 1

## 2010-07-29 MED FILL — FOLIC ACID 5 MG/ML IJ SOLN: 5 MG/ML | INTRAMUSCULAR | Qty: 0.2

## 2010-07-29 MED FILL — PRIMAXIN IV 500-500 MG IV SOLR: 500-500 MG | INTRAVENOUS | Qty: 500

## 2010-07-29 MED FILL — PROTONIX 40 MG IV SOLR: 40 MG | INTRAVENOUS | Qty: 40

## 2010-07-29 MED FILL — AMLODIPINE BESYLATE 5 MG PO TABS: 5 MG | ORAL | Qty: 1

## 2010-07-29 MED FILL — TOPROL XL 50 MG PO TB24: 50 MG | ORAL | Qty: 1

## 2010-07-29 NOTE — Progress Notes (Signed)
PGY- 1 Medicine Note    Hospital Day: 4                                                                     Code:Full Code  Admit Date: 07/26/2010                                             PCP: Deloria Lair, MD                                Interval Hx: Pt seen and examined. Phillip Harper reports feeling better this morning. He denies CP, SOB, and his abdominal pain is much better. He also denies N/V and is drinking fluids without associated abdominal pain. Nursing with no acute issues overnight.      Diet: Clear Liquid                                                 Antibiotics:       Primaxin 500 IV QID D2                                               Recent Labs   Basename 07/27/10 1620    PHART 7.44    PCO2ART 33*    PO2ART 67*       Vitals: BP 127/70   Pulse 100   Temp(Src) 100.1 ??F (37.8 ??C) (Oral)   Resp 20   Ht 5\' 9"  (1.753 m)   Wt 270 lb (122.471 kg)   BMI 39.87 kg/m2   SpO2 92%   I/O:    Intake/Output Summary (Last 24 hours) at 07/29/10 0849  Last data filed at 07/29/10 0301   Gross per 24 hour   Intake      0 ml   Output   1300 ml   Net  -1300 ml        I/O last 3 completed shifts:  In: -   Out: 1500 [Urine:1500]    Data:   Scheduled Meds:     ??? docusate sodium  100 mg Oral Daily   ??? imipenem-cilastatin (PRIMAXIN) IVPB  500 mg Intravenous Q6H SCH   ??? iopamidol  100 mL Intravenous Once   ??? pantoprazole  40 mg Intravenous Daily   ??? ondansetron  4 mg Intravenous Once   ??? fentanyl  50 mcg Intravenous Once   ??? HYDROmorphone  2 mg Intravenous Once   ??? allopurinol  300 mg Oral Daily   ??? amlodipine  5 mg Oral Daily   ??? metoprolol  50 mg Oral Daily   ??? vitamin D  50,000 Units Oral Weekly   ??? sodium chloride (PF)  10 mL Intravenous Q12H [facility]   ??? banana bag IV soln   Intravenous Once   ??? hydrALAZINE  25 mg Oral Q6H SCH   ??? banana bag IV soln   Intravenous Daily   ??? morphine  2 mg Intravenous Once        Continuous Infusions:     ??? lactated ringers 150 mL/hr at 07/29/10 0535       PRN Meds:HYDROmorphone,  iohexol, bisacodyl, sodium chloride (PF), acetaminophen, ondansetron, morphine, lorazepam, lorazepam, lorazepam, lorazepam, lorazepam, lorazepam, lorazepam    LABS:    CBC:   Recent Labs   [facility] 07/29/10 0545 07/28/10 0557 07/27/10 0558    WBC 14.6* 16.8* 15.7*    HGB 12.6* 12.4* 14.4    HCT 36.2* 36.0* 42.3    PLT 159 165 COM                                                                  BMP:  Recent Labs   Basename 07/29/10 0725 07/28/10 0557 07/27/10 0825    NA 143 141 144    K 3.9 3.9 3.9    CL 106 106 108    CO2 20* 23 22    BUN 11 9 9     CREATININE 0.80 0.86 0.77    GLUCOSE 78 100* 120*       LFT's: Recent Labs   Basename 07/29/10 0725 07/28/10 0557 07/27/10 0825    AST 46 46 53 53 96* 96*    ALT 61* 61* 71* 71* 121* 121*    ALB -- -- --    BILITOT 1.1 1.1 1.1 1.1 1.0 1.0    ALKPHOS 77 77 77 77 91 91         ABGs: Lab Results   Component Value Date    PHART 7.44 07/27/2010    PCO2ART 33 07/27/2010    PO2ART 67 07/27/2010       INR:   Recent Labs   Basename 07/27/10 0825    INR 1.1       U/A:  Recent Labs   Basename 07/27/10 1220    NITRITE --    COLORU Yellow    PHUR 5.5    LABCAST --    WBCUA 0-3    RBCUA --    MUCUS --    TRICHOMONAS --    YEAST --    BACTERIA --    CLARITYU Clear    SPECGRAV &gt;=1.030    LEUKOCYTESUR --    UROBILINOGEN --    BILIRUBINUR --    BLOODU Negative    GLUCOSEU Negative    KETONESU --    AMORPHOUS --      CT abdomen: 12/23  IMPRESSION-   1. Acute focal pancreatitis predominately involving the tail.   2. Diffuse mesenteric abdominal ascites, mild.   3. Small left pleural effusion and basilar subsegmental atelectasis.   4. Pelvic ascites.       Objective:   Vitals: BP 127/70   Pulse 100   Temp(Src) 100.1 ??F (37.8 ??C) (Oral)   Resp 20   Ht 5\' 9"  (1.753 m)   Wt 270 lb (122.471 kg)   BMI 39.87 kg/m2   SpO2 92%    ?? General appearance: AAO x 3, appears stated age and cooperative  ?? Skin: Skin color, texture, turgor normal.   ?? HEENT: NCAT, no lesions,  without obvious abnormality,  MMM, PERRL, no LAD, neck supple and NT, no carotid bruit, no JVD, trachea midline and thyroid not enlarged  ?? Lungs: LCTAB, no w/r/r  ?? Heart: tachycardic, sinus rhythm, S1, S2 normal, no m/r/g  ?? Abdomen: obese, NT, distended and tight, + BS; no masses,  no organomegaly  ?? Extremities: extremities normal, atraumatic, no cyanosis or edema  ?? Lymphatic: No significant lymph node enlargement papable  ?? Neurologic: CN II-XII grossly intact.  Mental status: Alert, oriented, thought content appropriate              ASSESSMENT AND PLAN:    Pancreatitis likely 2/2 alcohol use: - improving.   CT scan did not show necrosis of the pancrease. Lipase has come down from 5300 to 493 today with significant improvement in abdominal pain. Pt remains tachycardic and febrile, Tmax 101.1. Cultures NGTD.  - will increase LR to 200 mls/hr, given pt meets SIRS criteria.  - Surgery following  - Advance diet as tolerated, pt taking clears with no problems    Fever: pt remains febrile overnight  Fever 101.1 today. Denies dysuria, diarrhea, cough, sore throat or rash. No pancreatic necrosis of CT scan but has moderate ascites. ?infected. Urinalysis is clear for infection. Blood cultures did not show any growth.   - follow blood cultures  -paracentesis?, abdomen tight, but pt reports it as better than previously  -continue primaxin  - Follow.     Leukocytosis:   Not sure about the cause of leukocytosis. No signs of infection. UA and CXR are normal. No necrosis of pancrease on CT scan. Could be due to stress response. Another possibility could be SBP ?  WBC 14.6 today, down from 16.8 yesterday. Could also be CT's, pt getting Ativan on CIWA  - Continue to monitor.   - continue primaxin   -continue CIWA    HTN: - stable.   Continue home medicines except for lisinopril because of the risk of flaring up of pancreatitis.   - Increase hydralazine if blood pressure increases.     Gout: - stable.   Has history of gout. Would continue with allopurinol.    - Keep indomethacin and colchicine held     Alcohol abuse:   Did not require any ativan. Denies palpitations, tremors, agitation, headache, sweating or jitteriness. Patient was positive to 2 questions out of 4 for CAGE. Last drink was 4 days ago.   - Would initiate CIWA protocol.   - Banana bag.   - counseling.     Constipation:   This could be due to use of narcotics. Also patient is NPO. On physical exam he has very sluggish bowel sounds. CT of the abdomen did not show any signs of obstruction.   - Bisacodyl suppository   - Colace daily.     Code Status:Full Code   FEN: NPO until lipase becomes normal.   PPX: SCD   Disp: Inpatient      Will discuss plan with Dr. Delbert Phenix.    Smith Mince MD  Pgr: [phone]  07/29/2010,   8:49 AM

## 2010-07-29 NOTE — Progress Notes (Signed)
Addendum to Resident Progress note:  Pt seen,examined and evaluated. I have reviewed the current history, physical findings, labs and assessment and plan and agree with note as documented by resident MD.    Cont to have fevers and now tach in 120-140s.  Likely secondary to severe inflammation from pancreatitis- increase ivf rate/ cont empiric iv abx/ surgery following.    Blondell Reveal Delbert Phenix

## 2010-07-29 NOTE — Progress Notes (Addendum)
Surgery Daily Progress Note  AKRAM KISSICK  Subjective doing better, less pain         Objective    TEMPERATURE RANGE OVER 24HRS:   Temp  Avg: 99.9 ??F (37.7 ??C)  Min: 98.8 ??F (37.1 ??C)  Max: 101.1 ??F (38.4 ??C)  24HR RESPIRATORY RATE RANGE:  Resp  Avg: 19.3   Min: 18   Max: 20   24HR PULSE RANGE: Pulse  Avg: 106.3   Min: 82   Max: 119   24HR BLOOD PRESSURE RANGE:  Systolic (24hrs), Avg:139 mmHg, Min:127 mmHg, Max:147 mmHg  ; Diastolic (24hrs), Avg:78 mmHg, Min:70 mmHg, Max:87 mmHg    CURRENT PULSE OXIMETRY:  SpO2: 92 %     Infusions:     ??? lactated ringers 150 mL/hr at 07/29/10 0535        I/O:  Intake/Output Summary (Last 24 hours) at 07/29/10 0758  Last data filed at 07/29/10 0301   Gross per 24 hour   Intake      0 ml   Output   1500 ml   Net  -1500 ml              Wt Readings from Last 3 Encounters:   07/26/10 270 lb (122.471 kg)   06/12/10 282 lb (127.914 kg)   03/12/10 273 lb 12.8 oz (124.195 kg)       LABS:  Recent Labs   [facility] 07/29/10 0545 07/28/10 0557    WBC 14.6* 16.8*    HGB 12.6* 12.4*    HCT 36.2* 36.0*    MCV 98.8 98.2    PLT 159 165      Recent Labs   Basename 07/29/10 0725 07/28/10 0557    NA 143 141    K 3.9 3.9    CL 106 106    CO2 20* 23    PHOS -- --    BUN 11 9    CREATININE 0.80 0.86      Recent Labs   [facility] 07/29/10 0725 07/28/10 0557    AST 46 46 53 53    ALT 61* 61* 71* 71*    ALB -- --    BILIDIR 0.1 0.0    BILITOT 1.1 1.1 1.1 1.1    ALKPHOS 77 77 77 77        Recent Labs   Basename 07/29/10 0725 07/28/10 0557    LIPASE 493* 1736*    AMYLASE 47 121*        Recent Labs   Basename 07/29/10 0725 07/28/10 0557 07/27/10 0825    PROT 7.1 7.1 6.3 6.3 --    INR -- -- 1.1    APTT -- -- --      No results found for this basename: CKTOTAL:2,CKMB:2,CKMBINDEX:2,TROPONINI:2 in the last 72 hours            Exam:General appearance: alert, appears stated age and cooperative  Head: Normocephalic, without obvious abnormality  Eyes: negative findings: lids and lashes normal and conjunctivae and  sclerae normal  Lungs: crackles at right base otherwise has good air movement  Heart: regular rate and rhythm  Abdomen: soft, less tender in epigastrium, obese    ASSESSMENT/PLAN: Pt. is a 49 y.o. male with ETOH pancreatitis  ?? Decrease IVF  ?? Check Labs today  ?? Continue NPO for now, if Lipase level normalizing then possible advancement of diet    Hiral Concha Se 07/29/2010 7:58 AM        General Surgery Staff  for Dr. Sheria Lang    Patient seen and examined with resident staff.   Decrease in pain  ABD - soft, NT, mild distension  Lipase improved  OK to start on clear liquid diet    Nancy Nordmann, MD

## 2010-07-29 NOTE — Plan of Care (Signed)
Problem: Pain - Acute  Goal: Communication of presence of pain  Outcome: Ongoing  No c/o abd pain or nausea after taking po clear liquid diet, c/o some back pain, up to bathroom for small loose yellow brown stool voiding QS clear orange amber colored urine.

## 2010-07-29 NOTE — Plan of Care (Signed)
Problem: Pain - Acute  Goal: Communication of presence of pain  Outcome: Ongoing  Pt had pain in back, 7/10.  Pt received Dilaudid, 2 mg iv for pain.  Pt resting, eyes closed, call light in reach.

## 2010-07-30 LAB — HEPATIC FUNCTION PANEL
ALT: 52 U/L (ref 9–60)
AST: 44 U/L (ref 17–59)
Albumin: 2.9 g/dL — ABNORMAL LOW (ref 3.6–5.1)
Alkaline Phosphatase: 90 U/L (ref 40–115)
Bilirubin, Direct: 0 mg/dL (ref 0.0–0.2)
Total Bilirubin: 0.7 mg/dL (ref 0.2–1.2)
Total Protein: 6.4 g/dL (ref 6.2–8.3)

## 2010-07-30 LAB — CBC
Hematocrit: 32.5 % — ABNORMAL LOW (ref 38.5–50.0)
Hemoglobin: 11.3 g/dL — ABNORMAL LOW (ref 13.2–17.1)
MCH: 34 pg — ABNORMAL HIGH (ref 27.0–33.0)
MCHC: 34.8 g/dL (ref 32.0–36.0)
MCV: 97.6 fL (ref 80.0–100.0)
MPV: 8.3 fL (ref 7.5–11.5)
Platelets: 201 10*3/uL (ref 140–400)
RBC: 3.33 10*6/uL — ABNORMAL LOW (ref 4.20–5.80)
RDW: 14.1 % (ref 11.0–15.0)
WBC: 12.9 10*3/uL — ABNORMAL HIGH (ref 3.8–10.8)

## 2010-07-30 LAB — COMPREHENSIVE METABOLIC PANEL
1/Creatinine: 1.2 ratio
ALT: 52 U/L (ref 9–60)
AST: 44 U/L (ref 17–59)
Albumin: 2.9 g/dL — ABNORMAL LOW (ref 3.6–5.1)
Alkaline Phosphatase: 90 U/L (ref 40–115)
Anion Gap: 10 mmol/L (ref 3–16)
BUN: 8 mg/dL (ref 7–25)
CO2: 26 mmol/L (ref 21–33)
Calcium: 8.4 mg/dL — ABNORMAL LOW (ref 8.6–10.2)
Chloride: 104 mmol/L (ref 98–110)
Creatinine: 0.83 mg/dL (ref 0.50–1.30)
GFR Est, African/Amer: 120 See note.
GFR Est: 99 See note.
Glucose: 110 mg/dL — ABNORMAL HIGH (ref 65–99)
Potassium: 3.4 mmol/L — ABNORMAL LOW (ref 3.5–5.3)
Sodium: 140 mmol/L (ref 135–146)
Total Bilirubin: 0.7 mg/dL (ref 0.2–1.2)
Total Protein: 6.4 g/dL (ref 6.2–8.3)

## 2010-07-30 LAB — LIPASE: Lipase: 342 U/L — ABNORMAL HIGH (ref 23–300)

## 2010-07-30 MED ORDER — IOPAMIDOL 76 % IV SOLN
76 % | Freq: Once | INTRAVENOUS | Status: AC
Start: 2010-07-30 — End: 2010-07-30
  Administered 2010-07-30: 21:00:00 via INTRAVENOUS

## 2010-07-30 MED ADMIN — hydrALAZINE (APRESOLINE) tablet 25 mg: ORAL | @ 19:00:00 | NDC 63739012610

## 2010-07-30 MED ADMIN — hydrALAZINE (APRESOLINE) tablet 25 mg: ORAL | @ 07:00:00 | NDC 63739012610

## 2010-07-30 MED ADMIN — imipenem-cilastatin (PRIMAXIN) 500 mg in sodium chloride 0.9 % 100 mL IVPB: INTRAVENOUS | @ 19:00:00 | NDC 00409798437

## 2010-07-30 MED ADMIN — amlodipine (NORVASC) tablet 5 mg: ORAL | @ 13:00:00 | NDC 51079045101

## 2010-07-30 MED ADMIN — imipenem-cilastatin (PRIMAXIN) 500 mg in sodium chloride 0.9 % 100 mL IVPB: INTRAVENOUS | @ 07:00:00 | NDC 00409798437

## 2010-07-30 MED ADMIN — potassium chloride 10 mEq/100 mL IVPB peripheral line: INTRAVENOUS | @ 14:00:00 | NDC 00409707426

## 2010-07-30 MED ADMIN — metoprolol (TOPROL-XL) XL tablet 50 mg: ORAL | @ 13:00:00 | NDC 68084030411

## 2010-07-30 MED ADMIN — hydrALAZINE (APRESOLINE) tablet 25 mg: ORAL | NDC 63739012610

## 2010-07-30 MED ADMIN — imipenem-cilastatin (PRIMAXIN) 500 mg in sodium chloride 0.9 % 100 mL IVPB: INTRAVENOUS | NDC 00409798437

## 2010-07-30 MED ADMIN — banana bag IV soln: INTRAVENOUS | @ 16:00:00 | NDC 63323018410

## 2010-07-30 MED ADMIN — allopurinol (ZYLOPRIM) tablet 300 mg: ORAL | @ 13:00:00 | NDC 51079020601

## 2010-07-30 MED ADMIN — metoprolol (LOPRESSOR) injection 5 mg: INTRAVENOUS | @ 19:00:00 | NDC 63323066005

## 2010-07-30 MED ADMIN — hydrALAZINE (APRESOLINE) tablet 25 mg: ORAL | @ 12:00:00 | NDC 63739012610

## 2010-07-30 MED ADMIN — lactated ringers infusion: INTRAVENOUS | @ 05:00:00 | NDC 00409795348

## 2010-07-30 MED ADMIN — imipenem-cilastatin (PRIMAXIN) 500 mg in sodium chloride 0.9 % 100 mL IVPB: INTRAVENOUS | @ 12:00:00 | NDC 00409798437

## 2010-07-30 MED ADMIN — potassium chloride 10 mEq/100 mL IVPB peripheral line: INTRAVENOUS | @ 15:00:00 | NDC 00409707426

## 2010-07-30 MED ADMIN — docusate sodium (COLACE) capsule 100 mg: ORAL | @ 13:00:00 | NDC 00904224461

## 2010-07-30 MED ADMIN — HYDROmorphone (DILAUDID) injection 2 mg: 2 mg | INTRAVENOUS | @ 18:00:00 | NDC 00409131230

## 2010-07-30 MED ADMIN — HYDROmorphone (DILAUDID) injection 2 mg: 2 mg | INTRAVENOUS | NDC 00409131230

## 2010-07-30 MED ADMIN — HYDROmorphone (DILAUDID) injection 2 mg: 2 mg | INTRAVENOUS | @ 01:00:00 | NDC 00409131230

## 2010-07-30 MED ADMIN — HYDROmorphone (DILAUDID) injection 2 mg: 2 mg | INTRAVENOUS | @ 07:00:00 | NDC 00409131230

## 2010-07-30 MED ADMIN — HYDROmorphone (DILAUDID) injection 2 mg: 2 mg | INTRAVENOUS | @ 12:00:00 | NDC 00409131230

## 2010-07-30 MED ADMIN — acetaminophen (TYLENOL) tablet 650 mg: 650 mg | ORAL | @ 12:00:00 | NDC 50580050130

## 2010-07-30 MED FILL — FOLIC ACID 5 MG/ML IJ SOLN: 5 MG/ML | INTRAMUSCULAR | Qty: 0.2

## 2010-07-30 MED FILL — PRIMAXIN IV 500-500 MG IV SOLR: 500-500 MG | INTRAVENOUS | Qty: 500

## 2010-07-30 MED FILL — DOCUSATE SODIUM 100 MG PO CAPS: 100 MG | ORAL | Qty: 1

## 2010-07-30 MED FILL — PROTONIX 40 MG IV SOLR: 40 MG | INTRAVENOUS | Qty: 40

## 2010-07-30 MED FILL — HYDRALAZINE HCL 25 MG PO TABS: 25 MG | ORAL | Qty: 1

## 2010-07-30 MED FILL — HYDROMORPHONE HCL 2 MG/ML IJ SOLN: 2 MG/ML | INTRAMUSCULAR | Qty: 1

## 2010-07-30 MED FILL — TOPROL XL 50 MG PO TB24: 50 MG | ORAL | Qty: 1

## 2010-07-30 MED FILL — POTASSIUM CHLORIDE 10 MEQ/100ML IV SOLN: 10 MEQ/0ML | INTRAVENOUS | Qty: 100

## 2010-07-30 MED FILL — ACETAMINOPHEN 325 MG PO TABS: 325 MG | ORAL | Qty: 2

## 2010-07-30 MED FILL — AMLODIPINE BESYLATE 5 MG PO TABS: 5 MG | ORAL | Qty: 1

## 2010-07-30 MED FILL — ALLOPURINOL 300 MG PO TABS: 300 MG | ORAL | Qty: 1

## 2010-07-30 NOTE — Progress Notes (Signed)
Surgery Daily Progress Note  Phillip Harper  Subjective Fever of 102.7 yesterday, 100.6 this am         Objective    TEMPERATURE RANGE OVER 24HRS:   Temp  Avg: 101.1 ??F (38.4 ??C)  Min: 100.1 ??F (37.8 ??C)  Max: 102.7 ??F (39.3 ??C)  24HR RESPIRATORY RATE RANGE:  Resp  Avg: 19.7   Min: 18   Max: 22   24HR PULSE RANGE: Pulse  Avg: 110.5   Min: 100   Max: 121   24HR BLOOD PRESSURE RANGE:  Systolic (24hrs), Avg:141 mmHg, Min:127 mmHg, Max:151 mmHg  ; Diastolic (24hrs), Avg:78 mmHg, Min:70 mmHg, Max:87 mmHg    CURRENT PULSE OXIMETRY:  SpO2: 94 %     Infusions:       ??? lactated ringers 200 mL/hr at 07/29/10 2340        I/O:    Intake/Output Summary (Last 24 hours) at 07/30/10 0634  Last data filed at 07/29/10 2225   Gross per 24 hour   Intake   2340 ml   Output   1050 ml   Net   1290 ml              Wt Readings from Last 3 Encounters:   07/26/10 270 lb (122.471 kg)   06/12/10 282 lb (127.914 kg)   03/12/10 273 lb 12.8 oz (124.195 kg)       LABS:  Recent Labs   [facility] 07/30/10 0525 07/29/10 0545    WBC 12.9* 14.6*    HGB 11.3* 12.6*    HCT 32.5* 36.2*    MCV 97.6 98.8    PLT 201 159      Recent Labs   Basename 07/30/10 0525 07/29/10 0725    NA 140 143    K 3.4* 3.9    CL 104 106    CO2 26 20*    PHOS -- --    BUN 8 11    CREATININE 0.83 0.80      Recent Labs   [facility] 07/30/10 0525 07/29/10 0725    AST 44 44 46 46    ALT 52 52 61* 61*    ALB -- --    BILIDIR 0.0 0.1    BILITOT 0.7 0.7 1.1 1.1    ALKPHOS 90 90 77 77        Recent Labs   Basename 07/30/10 0525 07/29/10 0725 07/28/10 0557    LIPASE 342* 493* --    AMYLASE -- 47 121*        Recent Labs   Basename 07/30/10 0525 07/29/10 0725 07/27/10 0825    PROT 6.4 6.4 7.1 7.1 --    INR -- -- 1.1    APTT -- -- --      No results found for this basename: CKTOTAL:2,CKMB:2,CKMBINDEX:2,TROPONINI:2 in the last 72 hours            Exam:General appearance: alert, appears stated age and cooperative  Head: Normocephalic, without obvious abnormality  Eyes: negative findings: lids and  lashes normal and conjunctivae and sclerae normal  Lungs: CTAB  Heart: Tachycardic  Abdomen: soft, less tender in epigastrium, obese    ASSESSMENT/PLAN: Pt. is a 49 y.o. male with ETOH pancreatitis  ?? Decrease IVF to 125  ?? Tolerating clears -- WBC and lipase trending down  ?? Pain improved -- will cont to monitor    Rayburn Go 07/30/2010 6:34 AM

## 2010-07-30 NOTE — Plan of Care (Signed)
Problem: Pain - Acute  Goal: Communication of presence of pain  Outcome: Ongoing  Pt complained of pain in back, 7/10.  Pt was given Dilaudid, 2 mg iv for pain.  Pain was relieved somewhat.  Pt resting in bed, light off, call light in reach.

## 2010-07-30 NOTE — Progress Notes (Addendum)
Progress Note  PGY-1    Hospital Day: 5                                                         Code:Full Code  Admit Date: 07/26/2010                                             PCP: Deloria Lair, MD                                SUBJECTIVE:   Interval Hx:   Patient says he is doing better overall, but also reports a dull sensation, "not pain", but sensation in the middle of the back which feels like strange discomfort, also feels bloated, has been tolerating food without pain, and denies abd. Pain overall. Denies nausea or vomit. Denies diarrhea.   Has been ambulating and tolerating clears.        MEDICATIONS:   Scheduled Meds:     ??? potassium chloride  10 mEq Intravenous Q1H   ??? docusate sodium  100 mg Oral Daily   ??? imipenem-cilastatin (PRIMAXIN) IVPB  500 mg Intravenous Q6H SCH   ??? iopamidol  100 mL Intravenous Once   ??? pantoprazole  40 mg Intravenous Daily   ??? ondansetron  4 mg Intravenous Once   ??? fentanyl  50 mcg Intravenous Once   ??? HYDROmorphone  2 mg Intravenous Once   ??? allopurinol  300 mg Oral Daily   ??? amlodipine  5 mg Oral Daily   ??? metoprolol  50 mg Oral Daily   ??? vitamin D  50,000 Units Oral Weekly   ??? sodium chloride (PF)  10 mL Intravenous Q12H [facility]   ??? banana bag IV soln   Intravenous Once   ??? hydrALAZINE  25 mg Oral Q6H SCH   ??? banana bag IV soln   Intravenous Daily   ??? morphine  2 mg Intravenous Once      Continuous Infusions:     ??? lactated ringers 125 mL/hr at 07/30/10 0707     PRN Meds:metoprolol, HYDROmorphone, iohexol, bisacodyl, sodium chloride (PF), acetaminophen, ondansetron, morphine, lorazepam, lorazepam, lorazepam, lorazepam, lorazepam, lorazepam, lorazepam    Allergies: No Known Allergies    PHYSICAL EXAM:       Vitals: BP 148/77   Pulse 117   Temp(Src) 101.9 ??F (38.8 ??C) (Oral)   Resp 18   Ht 5\' 9"  (1.753 m)   Wt 270 lb (122.471 kg)   BMI 39.87 kg/m2   SpO2 94%    I/O:    Intake/Output Summary (Last 24 hours) at 07/30/10 0955  Last data filed at 07/29/10 2225   Gross per 24 hour    Intake   1980 ml   Output    750 ml   Net   1230 ml        I/O last 3 completed shifts:  In: 2340 [P.O.:1140; I.V.:1000; IV Piggyback:200]  Out: 1050 [Urine:1050]    General appearance: AAO x 3, appears stated age and cooperative, obese, in bed NAD  Skin: Skin color, texture, turgor normal.   HEENT: no lesions, without  obvious abnormality, MMM, PERRL, no LAD, neck supple and NT, no carotid bruit, no JVD, trachea midline and thyroid not enlarged   Lungs: LCTAB, no wheezing.   Heart: tachycardic, sinus rhythm, S1, S2 normal, no m/r/g   Abdomen: obese, NT, distended and tight, + BS; no masses, no organomegaly   Extremities: extremities normal, atraumatic, no cyanosis or edema   Lymphatic: No significant lymph node enlargement papable   Neurologic: CN II-XII grossly intact. Mental status: Alert, oriented, thought content appropriate                                                Recent Labs   [facility] 07/27/10 1620    PHART 7.44    PCO2ART 33*    PO2ART 67*           DATA:       Labs:  CBC:   Recent Labs   Basename 07/30/10 0525 07/29/10 0545 07/28/10 0557    WBC 12.9* 14.6* 16.8*    HGB 11.3* 12.6* 12.4*    HCT 32.5* 36.2* 36.0*    PLT 201 159 165       BMP: Recent Labs   Basename 07/30/10 0525 07/29/10 0725 07/28/10 0557    NA 140 143 141    K 3.4* 3.9 3.9    CL 104 106 106    CO2 26 20* 23    BUN 8 11 9     CREATININE 0.83 0.80 0.86    GLUCOSE 110* 78 100*     LFT's: Recent Labs   Basename 07/30/10 0525 07/29/10 0725 07/28/10 0557    AST 44 44 46 46 53 53    ALT 52 52 61* 61* 71* 71*    ALB -- -- --    BILITOT 0.7 0.7 1.1 1.1 1.1 1.1    ALKPHOS 90 90 77 77 77 77     Troponin: No results found for this basename: TROPONINI:3 in the last 72 hours  BNP: No results found for this basename: BNP:3 in the last 72 hours  ABGs: Recent Labs   Basename 07/27/10 1620    PHART 7.44    PCO2ART 33*    PO2ART 67*     INR: No results found for this basename: INR:3 in the last 72 hours  Lipids: No results found for this basename:  CHOL,HDL,LDLCALCU in the last 72 hours    U/A:  Recent Labs   Basename 07/27/10 1220    NITRITE --    COLORU Yellow    PHUR 5.5    LABCAST --    WBCUA 0-3    RBCUA --    MUCUS --    TRICHOMONAS --    YEAST --    BACTERIA --    CLARITYU Clear    SPECGRAV &gt;=1.030    LEUKOCYTESUR --    UROBILINOGEN --    BILIRUBINUR --    BLOODU Negative    GLUCOSEU Negative    KETONESU --    AMORPHOUS --          ASSESSMENT AND PLAN:   49 yo AAM admitted with pancreatitis.      Pancreatitis  CT scan did not show necrosis of the pancreas. Lipase has come down from 5300 to 300s today with significant improvement in abdominal pain. Pt remains tachycardic and febrile, Tmax 101.9. Cultures NGTD.   -  will re-scan abdomen to see interval changes.  - Surgery following   - Advance diet as tolerated, pt taking clears with no problems     Fever: pt remains febrile overnight   Fever 101.9 today. Denies dysuria, diarrhea, cough, sore throat or rash. No pancreatic necrosis of CT scan but has moderate ascites. infected. Urinalysis is clear for infection. Blood cultures did not show any growth.   - follow blood cultures   -CT to reasses  -continue primaxin   - Follow.     Leukocytosis:   Improving , likely secondary to pancreatitis.  No other sites of infection.  No necrosis of pancreas on CT scan. Could be due to stress response. Another possibility could be SBP ? WBC 12.    - Continue to monitor.   - continue primaxin   -continue CIWA       HTN: - stable.   Continue home medicines except for lisinopril because of the risk of flaring up of pancreatitis.   - Increase hydralazine if blood pressure increases.     Gout: - stable.   Has history of gout. Would continue with allopurinol.   - Keep indomethacin and colchicine held       Alcohol abuse:   No apparent withdrawal   Did not require any ativan. Denies palpitations, tremors, agitation, headache, sweating or jitteriness. Patient was positive to 2 questions out of 4 for CAGE. Last drink was 5 days  ago.   -  CIWA protocol.   - Banana bag.   - counseling.       Code Status:Full Code   FEN: clear liquids,    PPX: SCD , protonix   Disp: Inpatient    WDWA    -----------------------------  Wilfred Curtis., M.D.   Pager: [phone]   07/30/2010 9:55 AM     Addendum to Resident Progress note:   Pt seen,examined and evaluated with the medical resident team. I have reviewed the current history, physical findings, labs and assessment and plan and agree with note as documented    In addition, pt feels ok today.  Tolerating clears but notes "tight" feeling of abd after meals.  Still febrile.  Sepsis cont in setting of known alcoholic pancreatitis.  Will repeat CT scan with IV contrast to r/o panc necrosis, abscess, etc.  Cont Primaxin although no cx data to suggest this is needed at present.    Patient Active Problem List   Diagnoses   ??? Gout   ??? Hyperlipidemia   ??? HTN (hypertension)   ??? Vitamin D deficiency   ??? Acute alcoholic pancreatitis   ??? Alcoholic hepatitis   ??? Alcohol abuse   ??? Sepsis       Dr Grandville Silos

## 2010-07-30 NOTE — Plan of Care (Signed)
Problem: Pain - Acute  Goal: Communication of presence of pain  Outcome: Ongoing  Data:  Pt complained of pain to mid back 8/10.  Action:  Pain medication given for relief.Response:  Pt at rest in bed.  Call light in reach.

## 2010-07-30 NOTE — Progress Notes (Signed)
Progress Note    Data:  Vitals stable.  Lungs clear. + hypo bowel sounds.  Pt complains of pain to mid back.    Action:  Pain medication given for relief.  Encouraged pt to call with any needs.  Response:  Pt at rest in bed currently.  Call light in reach.

## 2010-07-31 LAB — COMPREHENSIVE METABOLIC PANEL
1/Creatinine: 1.37 ratio
ALT: 50 U/L (ref 9–60)
AST: 48 U/L (ref 17–59)
Albumin: 3 g/dL — ABNORMAL LOW (ref 3.6–5.1)
Alkaline Phosphatase: 88 U/L (ref 40–115)
Anion Gap: 12 mmol/L (ref 3–16)
BUN: 7 mg/dL (ref 7–25)
CO2: 23 mmol/L (ref 21–33)
Calcium: 8.5 mg/dL — ABNORMAL LOW (ref 8.6–10.2)
Chloride: 103 mmol/L (ref 98–110)
Creatinine: 0.73 mg/dL (ref 0.50–1.30)
GFR Est, African/Amer: 139 See note.
GFR Est: 115 See note.
Glucose: 91 mg/dL (ref 65–99)
Potassium: 3.4 mmol/L — ABNORMAL LOW (ref 3.5–5.3)
Sodium: 138 mmol/L (ref 135–146)
Total Bilirubin: 0.8 mg/dL (ref 0.2–1.2)
Total Protein: 6.5 g/dL (ref 6.2–8.3)

## 2010-07-31 LAB — HEPATIC FUNCTION PANEL
ALT: 50 U/L (ref 9–60)
AST: 48 U/L (ref 17–59)
Albumin: 3 g/dL — ABNORMAL LOW (ref 3.6–5.1)
Alkaline Phosphatase: 88 U/L (ref 40–115)
Bilirubin, Direct: 0 mg/dL (ref 0.0–0.2)
Total Bilirubin: 0.8 mg/dL (ref 0.2–1.2)
Total Protein: 6.5 g/dL (ref 6.2–8.3)

## 2010-07-31 LAB — LIPASE: Lipase: 319 U/L — ABNORMAL HIGH (ref 23–300)

## 2010-07-31 LAB — CBC
Hematocrit: 33.8 % — ABNORMAL LOW (ref 38.5–50.0)
Hemoglobin: 11.9 g/dL — ABNORMAL LOW (ref 13.2–17.1)
MCH: 34 pg — ABNORMAL HIGH (ref 27.0–33.0)
MCHC: 35 g/dL (ref 32.0–36.0)
MCV: 97.2 fL (ref 80.0–100.0)
MPV: 9 fL (ref 7.5–11.5)
Platelets: 201 10*3/uL (ref 140–400)
RBC: 3.48 10*6/uL — ABNORMAL LOW (ref 4.20–5.80)
RDW: 14 % (ref 11.0–15.0)
WBC: 13.2 10*3/uL — ABNORMAL HIGH (ref 3.8–10.8)

## 2010-07-31 MED ADMIN — hydrALAZINE (APRESOLINE) tablet 25 mg: ORAL | @ 11:00:00 | NDC 63739012610

## 2010-07-31 MED ADMIN — lactated ringers infusion: INTRAVENOUS | @ 05:00:00 | NDC 00409795348

## 2010-07-31 MED ADMIN — allopurinol (ZYLOPRIM) tablet 300 mg: ORAL | @ 13:00:00 | NDC 51079020601

## 2010-07-31 MED ADMIN — imipenem-cilastatin (PRIMAXIN) 500 mg in sodium chloride 0.9 % 100 mL IVPB: INTRAVENOUS | @ 05:00:00 | NDC 00409798437

## 2010-07-31 MED ADMIN — hydrALAZINE (APRESOLINE) tablet 25 mg: ORAL | @ 05:00:00 | NDC 63739012610

## 2010-07-31 MED ADMIN — metoprolol (TOPROL-XL) XL tablet 50 mg: ORAL | @ 13:00:00 | NDC 68084030411

## 2010-07-31 MED ADMIN — banana bag IV soln: INTRAVENOUS | @ 15:00:00 | NDC 63323018410

## 2010-07-31 MED ADMIN — metoprolol (TOPROL-XL) XL tablet 50 mg: ORAL | @ 23:00:00 | NDC 00186109039

## 2010-07-31 MED ADMIN — potassium chloride SA (K-DUR;KLOR-CON) tablet 40 mEq: ORAL | @ 15:00:00 | NDC 68084036011

## 2010-07-31 MED ADMIN — docusate sodium (COLACE) capsule 100 mg: ORAL | @ 13:00:00 | NDC 00904224461

## 2010-07-31 MED ADMIN — imipenem-cilastatin (PRIMAXIN) 500 mg in sodium chloride 0.9 % 100 mL IVPB: INTRAVENOUS | @ 11:00:00 | NDC 00409798437

## 2010-07-31 MED ADMIN — amlodipine (NORVASC) tablet 5 mg: ORAL | @ 13:00:00 | NDC 51079045101

## 2010-07-31 MED ADMIN — hydrALAZINE (APRESOLINE) tablet 25 mg: ORAL | @ 17:00:00 | NDC 63739012610

## 2010-07-31 MED ADMIN — lactated ringers infusion: INTRAVENOUS | @ 23:00:00 | NDC 00409795348

## 2010-07-31 MED ADMIN — HYDROmorphone (DILAUDID) injection 2 mg: 2 mg | INTRAVENOUS | @ 10:00:00 | NDC 00409131230

## 2010-07-31 MED ADMIN — HYDROmorphone (DILAUDID) injection 2 mg: 2 mg | INTRAVENOUS | @ 05:00:00 | NDC 00409131230

## 2010-07-31 MED ADMIN — oxycodone-acetaminophen (PERCOCET) 5-325 MG per tablet 1 tablet: 1 | ORAL | @ 19:00:00 | NDC 63481062301

## 2010-07-31 MED ADMIN — morphine injection 2 mg: 2 mg | INTRAVENOUS | @ 13:00:00 | NDC 00409176230

## 2010-07-31 MED FILL — PRIMAXIN IV 500-500 MG IV SOLR: 500-500 MG | INTRAVENOUS | Qty: 500

## 2010-07-31 MED FILL — HYDRALAZINE HCL 25 MG PO TABS: 25 MG | ORAL | Qty: 1

## 2010-07-31 MED FILL — VITAMIN D (ERGOCALCIFEROL) 1.25 MG (50000 UT) PO CAPS: 1.25 MG (50000 UT) | ORAL | Qty: 1

## 2010-07-31 MED FILL — PERCOCET 5-325 MG PO TABS: 5-325 MG | ORAL | Qty: 1

## 2010-07-31 MED FILL — MORPHINE SULFATE 2 MG/ML IJ SOLN: 2 mg/mL | INTRAMUSCULAR | Qty: 1

## 2010-07-31 MED FILL — HYDROMORPHONE HCL 2 MG/ML IJ SOLN: 2 MG/ML | INTRAMUSCULAR | Qty: 1

## 2010-07-31 MED FILL — FOLIC ACID 5 MG/ML IJ SOLN: 5 MG/ML | INTRAMUSCULAR | Qty: 0.2

## 2010-07-31 MED FILL — TOPROL XL 50 MG PO TB24: 50 MG | ORAL | Qty: 1

## 2010-07-31 MED FILL — POTASSIUM CHLORIDE CRYS ER 20 MEQ PO TBCR: 20 MEQ | ORAL | Qty: 2

## 2010-07-31 MED FILL — PROTONIX 40 MG IV SOLR: 40 MG | INTRAVENOUS | Qty: 40

## 2010-07-31 MED FILL — DOCUSATE SODIUM 100 MG PO CAPS: 100 MG | ORAL | Qty: 1

## 2010-07-31 MED FILL — ALLOPURINOL 300 MG PO TABS: 300 MG | ORAL | Qty: 1

## 2010-07-31 MED FILL — AMLODIPINE BESYLATE 5 MG PO TABS: 5 MG | ORAL | Qty: 1

## 2010-07-31 NOTE — Progress Notes (Addendum)
Surgery Daily Progress Note  Phillip Harper  Subjective resting comfortably in bed, clears causing some epigastric discomfort         Objective    TEMPERATURE RANGE OVER 24HRS:   Temp  Avg: 99.6 ??F (37.6 ??C)  Min: 98.4 ??F (36.9 ??C)  Max: 101.9 ??F (38.8 ??C)  24HR RESPIRATORY RATE RANGE:  Resp  Avg: 18   Min: 18   Max: 18   24HR PULSE RANGE: Pulse  Avg: 108.5   Min: 102   Max: 117   24HR BLOOD PRESSURE RANGE:  Systolic (24hrs), Avg:150 mmHg, Min:138 mmHg, Max:158 mmHg  ; Diastolic (24hrs), Avg:87 mmHg, Min:77 mmHg, Max:95 mmHg    CURRENT PULSE OXIMETRY:  SpO2: 94 %     Infusions:       ??? lactated ringers 125 mL/hr at 07/31/10 0004        I/O:    Intake/Output Summary (Last 24 hours) at 07/31/10 0635  Last data filed at 07/30/10 2222   Gross per 24 hour   Intake   1580 ml   Output   2376 ml   Net   -796 ml              Wt Readings from Last 3 Encounters:   07/26/10 270 lb (122.471 kg)   06/12/10 282 lb (127.914 kg)   03/12/10 273 lb 12.8 oz (124.195 kg)       LABS:  Recent Labs   Basename 07/31/10 0425 07/30/10 0525    WBC 13.2* 12.9*    HGB 11.9* 11.3*    HCT 33.8* 32.5*    MCV 97.2 97.6    PLT 201 201      Recent Labs   Basename 07/31/10 0425 07/30/10 0525    NA 138 140    K 3.4* 3.4*    CL 103 104    CO2 23 26    PHOS -- --    BUN 7 8    CREATININE 0.73 0.83      Recent Labs   [facility] 07/31/10 0425 07/30/10 0525    AST 48 48 44 44    ALT 50 50 52 52    ALB -- --    BILIDIR 0.0 0.0    BILITOT 0.8 0.8 0.7 0.7    ALKPHOS 88 88 90 90        Recent Labs   Basename 07/31/10 0425 07/30/10 0525 07/29/10 0725    LIPASE 319* 342* --    AMYLASE -- -- 47        Recent Labs   Basename 07/31/10 0425 07/30/10 0525    PROT 6.5 6.5 6.4 6.4    INR -- --    APTT -- --          Exam:General appearance: alert, appears stated age and cooperative  Head: Normocephalic, without obvious abnormality  Eyes: negative findings: lids and lashes normal and conjunctivae and sclerae normal  Lungs: CTAB  Heart: Tachycardic  Abdomen: soft, less  tender in epigastrium, obese    ASSESSMENT/PLAN: Pt. is a 49 y.o. male with ETOH pancreatitis  ?? IVF at 100cc  ?? Tolerating clears -- lipase cont to trend down, WBC stable  ?? Pain improved -- but still present with oral intake, will leave on clears    Cassie Freer 07/31/2010 6:35 AM    Please note that Primaxin is not indicated for pancreatitis in this patient.     Josmar Messimer Earlene Plater'

## 2010-07-31 NOTE — Plan of Care (Signed)
Problem:  Pain control  Goal:  Free from pain  Slept at intervals.  Complaints of back pain.  Medicated x 2 with Dilaudid for pain.  Stated some relief obtained from medicine.  Will continue to monitor and follow plan of care.

## 2010-07-31 NOTE — Progress Notes (Signed)
Called by nursing re: febrile.  Given previous infiltrate on CXR on 12/24 repeat CXR, urine culture and blood culture ordered.  Patient seen and examined.  Pt tachycardic, and notes back pain but otherwise has no complaints.  Will administer ibproufen for fever.      Sneha Willig Earlene Plater

## 2010-07-31 NOTE — Progress Notes (Addendum)
Progress Note  PGY-1    Hospital Day: 6                                                         Code:Full Code  Admit Date: 07/26/2010                                             PCP: Deloria Lair, MD                                SUBJECTIVE:   Interval Hx:   Patient feels better overall, abd. Tension has not resolved but stable, continues with back discomfort but better.  Tolerating clears, no n/v/d.  No fevers for the last day.        MEDICATIONS:   Scheduled Meds:     ??? potassium chloride SA  40 mEq Oral Once   ??? amlodipine  10 mg Oral Daily   ??? potassium chloride  10 mEq Intravenous Q1H   ??? iopamidol  100 mL Intravenous Once   ??? docusate sodium  100 mg Oral Daily   ??? imipenem-cilastatin (PRIMAXIN) IVPB  500 mg Intravenous Q6H SCH   ??? iopamidol  100 mL Intravenous Once   ??? pantoprazole  40 mg Intravenous Daily   ??? ondansetron  4 mg Intravenous Once   ??? fentanyl  50 mcg Intravenous Once   ??? HYDROmorphone  2 mg Intravenous Once   ??? allopurinol  300 mg Oral Daily   ??? metoprolol  50 mg Oral Daily   ??? vitamin D  50,000 Units Oral Weekly   ??? sodium chloride (PF)  10 mL Intravenous Q12H [facility]   ??? banana bag IV soln   Intravenous Once   ??? hydrALAZINE  25 mg Oral Q6H SCH   ??? banana bag IV soln   Intravenous Daily   ??? morphine  2 mg Intravenous Once      Continuous Infusions:     ??? lactated ringers 125 mL/hr at 07/31/10 0004     PRN Meds:metoprolol, HYDROmorphone, iohexol, bisacodyl, sodium chloride (PF), acetaminophen, ondansetron, morphine, lorazepam, lorazepam, lorazepam, lorazepam, lorazepam, lorazepam, lorazepam    Allergies: No Known Allergies    PHYSICAL EXAM:       Vitals: BP 166/82   Pulse 111   Temp(Src) 99.1 ??F (37.3 ??C) (Oral)   Resp 18   Ht 5\' 9"  (1.753 m)   Wt 270 lb (122.471 kg)   BMI 39.87 kg/m2   SpO2 98%    I/O:    Intake/Output Summary (Last 24 hours) at 07/31/10 1028  Last data filed at 07/31/10 0600   Gross per 24 hour   Intake   2955 ml   Output   3351 ml   Net   -396 ml        I/O last 3 completed  shifts:  In: 2955 [P.O.:780; I.V.:1875; IV Piggyback:300]  Out: 3351 [Urine:3350; Stool:1]    General appearance: AAO x 3, appears stated age and cooperative, obese, in bed NAD   Skin: Skin color, texture, turgor normal.   HEENT: no lesions, without obvious abnormality, MMM, PERRL, no LAD,  neck supple and NT, no carotid bruit, no JVD, trachea midline and thyroid not enlarged   Lungs: LCTAB, no wheezing.   Heart: tachycardic, sinus rhythm, S1, S2 normal, no m/r/g   Abdomen: obese, NT, distended and tight, + BS; no masses, no organomegaly   Extremities: extremities normal, atraumatic, no cyanosis or edema   Lymphatic: No significant lymph node enlargement papable   Neurologic: CN II-XII grossly intact. Mental status: Alert, oriented, thought content appropriate      No results found for this basename: PHART:2,PCO2ART:2,PO2ART:2 in the last 72 hours        DATA:       Labs:  CBC:   Recent Labs   Basename 07/31/10 0425 07/30/10 0525 07/29/10 0545    WBC 13.2* 12.9* 14.6*    HGB 11.9* 11.3* 12.6*    HCT 33.8* 32.5* 36.2*    PLT 201 201 159       BMP: Recent Labs   Basename 07/31/10 0425 07/30/10 0525 07/29/10 0725    NA 138 140 143    K 3.4* 3.4* 3.9    CL 103 104 106    CO2 23 26 20*    BUN 7 8 11     CREATININE 0.73 0.83 0.80    GLUCOSE 91 110* 78     LFT's: Recent Labs   Basename 07/31/10 0425 07/30/10 0525 07/29/10 0725    AST 48 48 44 44 46 46    ALT 50 50 52 52 61* 61*    ALB -- -- --    BILITOT 0.8 0.8 0.7 0.7 1.1 1.1    ALKPHOS 88 88 90 90 77 77         ASSESSMENT AND PLAN:   49 yo AAM admitted with pancreatitis.     Pancreatitis   CT scan did not show necrosis of the pancreas. Lipase has come down from 5300 to 300s today with significant improvement in abdominal pain. Pt remains tachycardic and now afebrile, Cultures NGTD.   - Surgery following   - Advance diet as tolerated, pt taking clears with no problems     Fever: no more fever overnight  Denies dysuria, diarrhea, cough, sore throat or rash. No pancreatic  necrosis of CT scan but has moderate ascites improving. Urinalysis is clear for infection. Blood cultures did not show any growth.   - follow blood cultures   -CT to reasses was improving  -dc primaxin?   - Follow.     Leukocytosis:   Improving , likely secondary to pancreatitis.   No other sites of infection. No necrosis of pancreas on repear CT scan. Could be due to stress response. Another possibility could be SBP ? WBC stable 13..   - Continue to monitor.   - consider dc primaxin  -continue CIWA     HTN: - stable.   Continue home medicines except for lisinopril because of the risk of flaring up of pancreatitis.   - Increase amlodipine to 10 given constant elevated BP.      Gout: - stable.   Has history of gout. Would continue with allopurinol.   - Keep indomethacin and colchicine held       Alcohol abuse:   No apparent withdrawal   Did not require any ativan. Denies palpitations, tremors, agitation, headache, sweating or jitteriness. Patient was positive to 2 questions out of 4 for CAGE. Last drink was 5 days ago.   - CIWA protocol. Has not needed it.  - Banana bag.   -  counseling.     Code Status:Full Code   FEN: clear liquids,   PPX: SCD , protonix   Disp: Inpatient     WDWA        -----------------------------  Wilfred Curtis., M.D.   Pager: [phone]   07/31/2010 10:28 AM     Addendum to Resident H& P/Progres note:  Pt seen,examined and evaluated. I have reviewed the current history, physical findings, labs and assessment and plan and agree with note as documented by resident MD ( Dr.Valdes). K-3.4 ( will replace).  Epigastric abdominal pain is better; the patient tolerates clear liquids well. Lipase is down to 319.  Will d/c Primaxin IV.  Hassie Bruce

## 2010-07-31 NOTE — Plan of Care (Signed)
Problem: Pain - Acute  Goal: Communication of presence of pain  Outcome: Ongoing  Patient c/o back pain 5/10 this morning. Patient given morphine for pain, which was effective. Will continue to monitor comfort.

## 2010-08-01 LAB — CBC
Hematocrit: 33.8 % — ABNORMAL LOW (ref 38.5–50.0)
Hemoglobin: 11.8 g/dL — ABNORMAL LOW (ref 13.2–17.1)
MCH: 33.7 pg — ABNORMAL HIGH (ref 27.0–33.0)
MCHC: 34.9 g/dL (ref 32.0–36.0)
MCV: 96.7 fL (ref 80.0–100.0)
MPV: 8 fL (ref 7.5–11.5)
Platelets: 309 10*3/uL (ref 140–400)
RBC: 3.5 10*6/uL — ABNORMAL LOW (ref 4.20–5.80)
RDW: 14.3 % (ref 11.0–15.0)
WBC: 17.9 10*3/uL — ABNORMAL HIGH (ref 3.8–10.8)

## 2010-08-01 LAB — COMPREHENSIVE METABOLIC PANEL
1/Creatinine: 1.43 ratio
ALT: 62 U/L — ABNORMAL HIGH (ref 9–60)
AST: 66 U/L — ABNORMAL HIGH (ref 17–59)
Albumin: 3.2 g/dL — ABNORMAL LOW (ref 3.6–5.1)
Alkaline Phosphatase: 113 U/L (ref 40–115)
Anion Gap: 14 mmol/L (ref 3–16)
BUN: 6 mg/dL — ABNORMAL LOW (ref 7–25)
CO2: 23 mmol/L (ref 21–33)
Calcium: 9 mg/dL (ref 8.6–10.2)
Chloride: 103 mmol/L (ref 98–110)
Creatinine: 0.7 mg/dL (ref 0.50–1.30)
GFR Est, African/Amer: 146 See note.
GFR Est: 120 See note.
Glucose: 118 mg/dL — ABNORMAL HIGH (ref 65–99)
Potassium: 3.2 mmol/L — ABNORMAL LOW (ref 3.5–5.3)
Sodium: 140 mmol/L (ref 135–146)
Total Bilirubin: 0.7 mg/dL (ref 0.2–1.2)
Total Protein: 6.9 g/dL (ref 6.2–8.3)

## 2010-08-01 LAB — URINALYSIS
Bilirubin, Urine: NEGATIVE
Blood, Urine: NEGATIVE
Glucose, UA: NEGATIVE mg/dL
Ketones, Urine: NEGATIVE mg/dL
Leukocyte Esterase, Urine: NEGATIVE
Nitrite, Urine: NEGATIVE
Protein, UA: NEGATIVE mg/dL
Specific Gravity, UA: 1.015 (ref 1.005–1.035)
Urobilinogen, Urine: 0.2 EU/dL (ref 0.2–1.0)
pH, UA: 8 (ref 5.0–8.0)

## 2010-08-01 LAB — MAGNESIUM: Magnesium: 2.1 mg/dL (ref 1.5–2.5)

## 2010-08-01 LAB — LIPASE: Lipase: 291 U/L (ref 23–300)

## 2010-08-01 MED ADMIN — metoprolol (TOPROL-XL) XL tablet 50 mg: ORAL | @ 22:00:00 | NDC 68084030411

## 2010-08-01 MED ADMIN — lactated ringers infusion: INTRAVENOUS | @ 06:00:00 | NDC 00409795348

## 2010-08-01 MED ADMIN — hydrALAZINE (APRESOLINE) tablet 25 mg: ORAL | @ 03:00:00 | NDC 63739012610

## 2010-08-01 MED ADMIN — metoprolol (TOPROL-XL) XL tablet 50 mg: ORAL | @ 14:00:00 | NDC 68084030411

## 2010-08-01 MED ADMIN — banana bag IV soln: INTRAVENOUS | @ 14:00:00 | NDC 63323018410

## 2010-08-01 MED ADMIN — ibuprofen (ADVIL;MOTRIN) tablet 400 mg: ORAL | @ 05:00:00 | NDC 00904585361

## 2010-08-01 MED ADMIN — allopurinol (ZYLOPRIM) tablet 300 mg: ORAL | @ 14:00:00 | NDC 51079020601

## 2010-08-01 MED ADMIN — hydrALAZINE (APRESOLINE) tablet 25 mg: ORAL | @ 19:00:00 | NDC 63739012610

## 2010-08-01 MED ADMIN — hydrALAZINE (APRESOLINE) tablet 25 mg: ORAL | @ 11:00:00 | NDC 63739012610

## 2010-08-01 MED ADMIN — metronidazole (FLAGYL) tablet 500 mg: ORAL | @ 19:00:00 | NDC 63739017610

## 2010-08-01 MED ADMIN — amlodipine (NORVASC) tablet 5 mg: ORAL | @ 14:00:00 | NDC 51079045101

## 2010-08-01 MED ADMIN — potassium chloride SA (K-DUR;KLOR-CON) tablet 40 mEq: ORAL | @ 22:00:00 | NDC 68084036011

## 2010-08-01 MED ADMIN — vitamin D (ERGOCALCIFEROL) capsule 50,000 Units: ORAL | @ 14:00:00 | NDC 68084046311

## 2010-08-01 MED ADMIN — oxycodone-acetaminophen (PERCOCET) 5-325 MG per tablet 1 tablet: 1 | ORAL | @ 17:00:00 | NDC 63481062301

## 2010-08-01 MED ADMIN — oxycodone-acetaminophen (PERCOCET) 5-325 MG per tablet 1 tablet: 1 | ORAL | @ 02:00:00 | NDC 63481062301

## 2010-08-01 MED ADMIN — oxycodone-acetaminophen (PERCOCET) 5-325 MG per tablet 1 tablet: 1 | ORAL | @ 06:00:00 | NDC 63481062301

## 2010-08-01 MED ADMIN — oxycodone-acetaminophen (PERCOCET) 5-325 MG per tablet 1 tablet: 1 | ORAL | @ 23:00:00 | NDC 63481062301

## 2010-08-01 MED FILL — POTASSIUM CHLORIDE CRYS ER 20 MEQ PO TBCR: 20 MEQ | ORAL | Qty: 2

## 2010-08-01 MED FILL — PERCOCET 5-325 MG PO TABS: 5-325 MG | ORAL | Qty: 1

## 2010-08-01 MED FILL — HYDRALAZINE HCL 25 MG PO TABS: 25 MG | ORAL | Qty: 1

## 2010-08-01 MED FILL — METRONIDAZOLE 500 MG PO TABS: 500 MG | ORAL | Qty: 1

## 2010-08-01 MED FILL — TOPROL XL 50 MG PO TB24: 50 MG | ORAL | Qty: 1

## 2010-08-01 MED FILL — DOCUSATE SODIUM 100 MG PO CAPS: 100 MG | ORAL | Qty: 1

## 2010-08-01 MED FILL — IBUPROFEN 400 MG PO TABS: 400 MG | ORAL | Qty: 1

## 2010-08-01 MED FILL — PROTONIX 40 MG IV SOLR: 40 MG | INTRAVENOUS | Qty: 40

## 2010-08-01 MED FILL — AMLODIPINE BESYLATE 5 MG PO TABS: 5 MG | ORAL | Qty: 1

## 2010-08-01 MED FILL — FOLIC ACID 5 MG/ML IJ SOLN: 5 MG/ML | INTRAMUSCULAR | Qty: 0.2

## 2010-08-01 MED FILL — ALLOPURINOL 300 MG PO TABS: 300 MG | ORAL | Qty: 1

## 2010-08-01 NOTE — Progress Notes (Signed)
Surgery Daily Progress Note  Phillip Harper  Subjective: Febrile overnight, atelectasis LLL stable       Objective   Infusions:     ??? lactated ringers 100 mL/hr at 08/01/10 0106        I/O:I/O last 3 completed shifts:  In: 3434.2 [P.O.:520; I.V.:2714.2; IV Piggyback:200]  Out: 3075 [Urine:3075]           Wt Readings from Last 1 Encounters:   07/26/10 270 lb (122.471 kg)                 LABS:    Recent Labs   Basename 07/31/10 0425 07/30/10 0525    WBC 13.2* 12.9*    HGB 11.9* 11.3*    HCT 33.8* 32.5*    MCV 97.2 97.6    PLT 201 201        Recent Labs   [facility] 07/31/10 0425 07/30/10 0525    NA 138 140    K 3.4* 3.4*    CL 103 104    CO2 23 26    PHOS -- --    BUN 7 8    CREATININE 0.73 0.83        Recent Labs   [facility] 07/31/10 0425 07/30/10 0525    AST 48 48 44 44    ALT 50 50 52 52    ALB -- --    BILIDIR 0.0 0.0    BILITOT 0.8 0.8 0.7 0.7    ALKPHOS 88 88 90 90        Recent Labs   Basename 07/31/10 0425 07/30/10 0525 07/29/10 0725    LIPASE 319* 342* --    AMYLASE -- -- 47        Recent Labs   Basename 07/31/10 0425 07/30/10 0525    PROT 6.5 6.5 6.4 6.4    INR -- --    APTT -- --        Exam:BP 150/76   Pulse 103   Temp(Src) 98.1 ??F (36.7 ??C) (Oral)   Resp 18   Ht 5\' 9"  (1.753 m)   Wt 270 lb (122.471 kg)   BMI 39.87 kg/m2   SpO2 100%  General appearance: alert, appears stated age and cooperative  Lungs: clear to auscultation bilaterally  Heart: tachycardic rate and regular rhythm, S1, S2 normal, no murmur, click, rub or gallop  Abdomen: soft, non-tender; bowel sounds normal; no masses,  no organomegaly      ASSESSMENT/PLAN: Pt. is a 49 y.o. male s/p ETOH pancreatitis    Clinically improved if lipase trending down will advance to low fat diet    Theodore Demark 08/01/2010 6:29 AM  [phone]

## 2010-08-01 NOTE — Progress Notes (Signed)
Pt was found to be febrile @ 102.3 oral. HR tachy in low 100s. Pt denies feeling "feverish." Only with c/o back pain. Dr. Earlene Plater, surgical resident, made aware of temp. UA and urine culture sent. UA negative. Chest xray done. Blood cultures done. Pt given Mortin as ordered. Pt working on IS well. Pt temp rechecked and was 100.3 oral. Will cont to monitor.    LR

## 2010-08-01 NOTE — Plan of Care (Signed)
Problem: Pain - Acute  Goal: Communication of presence of pain  Outcome: Ongoing  Pt with c/o back pain. Pt given prn Percocet as ordered. Pt currently asleep in bed with call light in reach. Will cont POC

## 2010-08-01 NOTE — Progress Notes (Signed)
Patient having frequent episodes of diarrhea. MD aware. Stool for cdiff ordered and sent. Patient given education on cdiff and isolation precautions. Patient placed in isolation per protocol. Awaiting results.

## 2010-08-01 NOTE — Progress Notes (Addendum)
Department of Internal Medicine  Progress Note  8:09 AM12/28/2011  Admit Date: 07/26/2010   Hospital Day: 7                                                   PCP   Deloria Lair, MD  DOB: 1960/09/02                                                       CodeStatus:Full Code  MRN: [id number]                                                        Diet: Clear Liquid                                                     24 hr interval events:  Fevers to 102.3, Diarrhea since yesterday, appetite improving, pain well controlled,   OBJECTIVE :  Medications        Allergies:    Review of patient's allergies indicates no known allergies.  Current Medications:  Current Facility-Administered Medications   Medication Dose Route Frequency Provider Last Rate Last Dose   ??? potassium chloride SA (K-DUR;KLOR-CON) tablet 40 mEq  40 mEq Oral Once Wilfred Curtis., MD   40 mEq at 07/31/10 1023   ??? amlodipine (NORVASC) tablet 5 mg  5 mg Oral Daily Wilfred Curtis., MD       ??? metoprolol (TOPROL-XL) XL tablet 50 mg  50 mg Oral BID Wilfred Curtis., MD   50 mg at 07/31/10 1815   ??? oxycodone-acetaminophen (PERCOCET) 5-325 MG per tablet 1 tablet  1 tablet Oral Q4H PRN Wilfred Curtis., MD   1 tablet at 08/01/10 0106   ??? hydrALAZINE (APRESOLINE) tablet 25 mg  25 mg Oral [facility] Wilfred Curtis., MD   25 mg at 08/01/10 0547   ??? ibuprofen (ADVIL;MOTRIN) tablet 400 mg  400 mg Oral Q6H PRN Tylene Fantasia, MD   400 mg at 08/01/10 0008   ??? potassium chloride 10 mEq/100 mL IVPB peripheral line  10 mEq Intravenous Q1H Wilfred Curtis., MD   10 mEq at 07/30/10 1008   ??? iopamidol (ISOVUE-370) 76 % injection 100 mL  100 mL Intravenous Once Dema Severin, MD   100 mL at 07/30/10 1543   ??? metoprolol (LOPRESSOR) injection 5 mg  5 mg Intravenous Q6H PRN Velora Heckler, MD   5 mg at 07/30/10 1421   ??? docusate sodium (COLACE) capsule 100 mg  100 mg Oral Daily Santiago Glad, MD   100 mg at 07/31/10 6578   ??? iohexol (OMNIPAQUE)  injection 50 mL  50 mL Oral ONCE PRN Renford Dills, MD   50 mL at 07/27/10 1223   ??? iopamidol (ISOVUE-370) 76 % injection 100 mL  100 mL Intravenous Once Renford Dills,  MD       ??? bisacodyl (DULCOLAX) suppository 10 mg  10 mg Rectal PRN Santiago Glad, MD   10 mg at 07/28/10 0900   ??? pantoprazole (PROTONIX) injection 40 mg  40 mg Intravenous Daily Santiago Glad, MD   40 mg at 07/31/10 4540   ??? lactated ringers infusion   Intravenous Continuous Para Skeans, MD 100 mL/hr at 08/01/10 0106     ??? ondansetron (ZOFRAN) injection 4 mg  4 mg Intravenous Once Wyn Forster, DO   4 mg at 07/26/10 1156   ??? fentanyl (SUBLIMAZE) injection 50 mcg  50 mcg Intravenous Once Wyn Forster, DO   50 mcg at 07/26/10 1156   ??? HYDROmorphone (DILAUDID) injection 2 mg  2 mg Intravenous Once Wyn Forster, DO   2 mg at 07/26/10 1337   ??? allopurinol (ZYLOPRIM) tablet 300 mg  300 mg Oral Daily Myer Haff, MD   300 mg at 07/31/10 9811   ??? vitamin D (ERGOCALCIFEROL) capsule 50,000 Units  50,000 Units Oral Weekly Myer Haff, MD       ??? sodium chloride (PF) 0.9 % injection 10 mL  10 mL Intravenous Q12H [facility] Myer Haff, MD   10 mL at 07/29/10 0900   ??? sodium chloride (PF) 0.9 % injection 10 mL  10 mL Intravenous PRN Myer Haff, MD       ??? ondansetron (ZOFRAN) injection 4 mg  4 mg Intravenous Q6H PRN Myer Haff, MD   4 mg at 07/27/10 0948   ??? banana bag IV soln   Intravenous Once Myer Haff, MD       ??? morphine injection 2 mg  2 mg Intravenous Q4H PRN Myer Haff, MD   2 mg at 07/31/10 0827   ??? banana bag IV soln   Intravenous Daily Dema Severin, MD       ??? morphine injection 2 mg  2 mg Intravenous Once Sanjuana Mae, MD   2 mg at 07/26/10 2145         Physical    I/O last 3 completed shifts:  In: 3039.2 [P.O.:460; I.V.:2579.2]  Out: 3075 [Urine:3075]  Patient Vitals for the past 24 hrs:   BP Temp Temp src Pulse Resp SpO2   08/01/10 0632 150/81 mmHg 99.8 ??F (37.7 ??C) Oral 101   18  97 %   08/01/10 0300 150/76 mmHg 98.1 ??F (36.7 ??C) Oral 103  18  100 %   08/01/10 0101 - 100.3 ??F (37.9 ??C) Oral - - -   07/31/10 2236 148/72 mmHg 102.3 ??F (39.1 ??C) Oral 106  20  98 %   07/31/10 2147 150/73 mmHg 102.1 ??F (38.9 ??C) Oral 109  20  94 %   07/31/10 1425 159/100 mmHg 100.9 ??F (38.3 ??C) Oral 111  20  94 %   07/31/10 1100 173/96 mmHg 99.4 ??F (37.4 ??C) Oral 112  20  97 %     General appearance: AAO x 3, appears stated age and cooperative, obese, in bed NAD  Skin: Skin color, texture, turgor normal.   HEENT: no lesions, without obvious abnormality, MMM, PERRL, no LAD, neck supple and NT, no carotid bruit, no JVD, trachea midline and thyroid not enlarged   Lungs: LCTAB, no wheezing.   Heart: tachycardic, sinus rhythm, S1, S2 normal, no m/r/g   Abdomen: obese, NT, distended and tight, + BS; no masses, no organomegaly   Extremities: extremities normal, atraumatic, no cyanosis or edema  Lymphatic: No significant lymph node enlargement papable   Neurologic: CN II-XII grossly intact. Mental status: Alert, oriented, thought content appropriate      Data    LABS:  CBC:   Recent Labs   [facility] 07/31/10 0425    WBC 13.2*    HGB 11.9*    HCT 33.8*    PLT 201    MCV 97.2     Renal:  Recent Labs   [facility] 07/31/10 0425    NA 138    K 3.4*    CL 103    CO2 23    BUN 7    CREATININE 0.73    GLUCOSE 91    CALCIUM 8.5*    MG --    PHOS --    ANIONGAP 12     Hepatic: Recent Labs   Basename 07/31/10 0425    AST 48 48    ALT 50 50    BILITOT 0.8 0.8    BILIDIR 0.0    PROT 6.5 6.5    LABALBU 3.0* 3.0*    ALKPHOS 88 88         ASSESSMENT AND PLAN    49 yo AAM admitted with pancreatitis.   Pancreatitis-improving   CT scan did not show necrosis of the pancreas. Lipase has come down from 5300 to 300s today with significant improvement in abdominal pain. Pt remains tachycardic and now afebrile, Cultures NGTD.   - Surgery following   -Primaxin d/c'd  -lipase trending down and clinically looks improved with improving  appetite  fevers possibly 2/2 drugs vs atelectasis  -willl continue to monitor for now, encouraged IS    HTN: - stable.   Continue home medicines except for lisinopril because of the risk of flaring up of pancreatitis.   - will continue to monitor for now w/PRN metoprolol.     Gout: - stable.   Has history of gout. Would continue with allopurinol.   - Keep indomethacin and colchicine held     Alcohol abuse:   No apparent withdrawal   Did not require any ativan. Denies palpitations, tremors, agitation, headache, sweating or jitteriness. Patient was positive to 2 questions out of 4 for CAGE. Last drink was 5 days ago.   - CIWA protocol. Has not needed it.   - Banana bag.   - counseling.     Diarrhea    C. Diff tox positive, started flagyl     CodeStatus:Full Code  FEN: Diet: Clear Liquid  PPX:     I have discussed the care of this patient with the attending physician who agrees with plan as detailed above.  Madelyn Flavors, MD PGY1  [phone]

## 2010-08-01 NOTE — Plan of Care (Signed)
Problem: Pain - Acute  Goal: Communication of presence of pain  Outcome: Met This Shift  Pt with co back pain.  Pt given 1 percocet and repositioned self in bed.  Will cont to monitor pt pain    Problem: Infection - Risk of, Clostridium Difficile Infection  Goal: Absence of infection signs and symptoms  Outcome: Met This Shift  Pt in isolation for C-Diff.   Pt instructed on hand washing and isolation precautions observed. Pt afebrile reports decrease in BMs.  Will cont to monitor.

## 2010-08-01 NOTE — Plan of Care (Signed)
Problem: Pain - Acute  Goal: Communication of presence of pain  Outcome: Ongoing  Patient states pain is tolerable. Will continue to monitor comfort.

## 2010-08-01 NOTE — Plan of Care (Signed)
Problem: Infection - Risk of, Clostridium Difficile Infection  Goal: Absence of infection signs and symptoms  Outcome: Ongoing  Patient positive for cdiff. Patient educated on cdiff education and started on flagyl. Patient placed in isolation.

## 2010-08-01 NOTE — Progress Notes (Signed)
Addendum to Resident H& P/Progres note:  Pt seen,examined and evaluated. I have reviewed the current history, physical findings, labs and assessment and plan and agree with note as documented by resident MD ( Dr.Conrad). Spiked fever up to 102.3 F last night @ 1 am; BP range 148/72-158/80 mm Hg. Lipase is down to 291; Hb-11.8; K-3.2.  The patient reported 5 loose BMs in the last 24h.  Chest X ray ( I reviewed it personally)-1. Persistent left lower lobe atelectasis and/or infiltrate   demonstrating little change from the prior exam.   2. Mild stable cardiomegaly.   No abdominal pain.  Plan: advance diet to full liquids; replace K; check stool for C diff; encourage aggressive incentive spirometry for LLL atelectasis; cont current anti-HTN meds.  Hassie Bruce

## 2010-08-01 NOTE — Plan of Care (Signed)
Problem: Pain - Acute  Goal: Communication of presence of pain  Outcome: Ongoing  Pt complained of pain in back, 6/10.  Pt received one percocet for pain.    Problem: Infection - Risk of, Clostridium Difficile Infection  Goal: Absence of infection signs and symptoms  Outcome: Ongoing  Pt remains in isolation for C diff, precautions have been observed.  Pt has had no loose stools during this shift.

## 2010-08-02 LAB — CBC
Hematocrit: 32.3 % — ABNORMAL LOW (ref 38.5–50.0)
Hemoglobin: 11.2 g/dL — ABNORMAL LOW (ref 13.2–17.1)
MCH: 34 pg — ABNORMAL HIGH (ref 27.0–33.0)
MCHC: 34.7 g/dL (ref 32.0–36.0)
MCV: 97.8 fL (ref 80.0–100.0)
MPV: 8.3 fL (ref 7.5–11.5)
Platelets: 315 10*3/uL (ref 140–400)
RBC: 3.31 10*6/uL — ABNORMAL LOW (ref 4.20–5.80)
RDW: 14 % (ref 11.0–15.0)
WBC: 13.3 10*3/uL — ABNORMAL HIGH (ref 3.8–10.8)

## 2010-08-02 LAB — COMPREHENSIVE METABOLIC PANEL
1/Creatinine: 1.37 ratio
ALT: 61 U/L — ABNORMAL HIGH (ref 9–60)
AST: 50 U/L (ref 17–59)
Albumin: 2.9 g/dL — ABNORMAL LOW (ref 3.6–5.1)
Alkaline Phosphatase: 108 U/L (ref 40–115)
Anion Gap: 16 mmol/L (ref 3–16)
BUN: 6 mg/dL — ABNORMAL LOW (ref 7–25)
CO2: 23 mmol/L (ref 21–33)
Calcium: 8.3 mg/dL — ABNORMAL LOW (ref 8.6–10.2)
Chloride: 103 mmol/L (ref 98–110)
Creatinine: 0.73 mg/dL (ref 0.50–1.30)
GFR Est, African/Amer: 139 See note.
GFR Est: 115 See note.
Glucose: 94 mg/dL (ref 65–99)
Potassium: 3.3 mmol/L — ABNORMAL LOW (ref 3.5–5.3)
Sodium: 142 mmol/L (ref 135–146)
Total Bilirubin: 0.4 mg/dL (ref 0.2–1.2)
Total Protein: 6.4 g/dL (ref 6.2–8.3)

## 2010-08-02 LAB — LIPASE: Lipase: 360 U/L — ABNORMAL HIGH (ref 23–300)

## 2010-08-02 MED ORDER — HYDRALAZINE HCL 25 MG PO TABS
25 MG | ORAL_TABLET | Freq: Three times a day (TID) | ORAL | Status: DC
Start: 2010-08-02 — End: 2010-11-07

## 2010-08-02 MED ORDER — OXYCODONE-ACETAMINOPHEN 5-325 MG PO TABS
5-325 MG | ORAL_TABLET | ORAL | Status: AC | PRN
Start: 2010-08-02 — End: 2010-08-09

## 2010-08-02 MED ORDER — METRONIDAZOLE 500 MG PO TABS
500 MG | ORAL_TABLET | Freq: Three times a day (TID) | ORAL | Status: AC
Start: 2010-08-02 — End: 2010-08-16

## 2010-08-02 MED ADMIN — metronidazole (FLAGYL) tablet 500 mg: ORAL | @ 03:00:00 | NDC 51079012601

## 2010-08-02 MED ADMIN — ibuprofen (ADVIL;MOTRIN) tablet 400 mg: ORAL | @ 07:00:00 | NDC 00904585361

## 2010-08-02 MED ADMIN — lactated ringers infusion: INTRAVENOUS | @ 07:00:00 | NDC 00409795348

## 2010-08-02 MED ADMIN — metronidazole (FLAGYL) tablet 500 mg: ORAL | @ 11:00:00 | NDC 51079012601

## 2010-08-02 MED ADMIN — magnesium sulfate in dextrose 5% 100 mL infusion 1 g: INTRAVENOUS | @ 14:00:00 | NDC 00409672723

## 2010-08-02 MED ADMIN — allopurinol (ZYLOPRIM) tablet 300 mg: ORAL | @ 14:00:00 | NDC 51079020601

## 2010-08-02 MED ADMIN — hydrALAZINE (APRESOLINE) tablet 25 mg: ORAL | @ 03:00:00 | NDC 62584073311

## 2010-08-02 MED ADMIN — amlodipine (NORVASC) tablet 5 mg: ORAL | @ 14:00:00 | NDC 51079045101

## 2010-08-02 MED ADMIN — hydrALAZINE (APRESOLINE) tablet 25 mg: ORAL | @ 11:00:00 | NDC 63739012610

## 2010-08-02 MED ADMIN — potassium chloride SA (K-DUR;KLOR-CON) tablet 40 mEq: ORAL | @ 14:00:00 | NDC 68084036011

## 2010-08-02 MED ADMIN — metoprolol (TOPROL-XL) XL tablet 50 mg: ORAL | @ 15:00:00 | NDC 00186109039

## 2010-08-02 MED ADMIN — oxycodone-acetaminophen (PERCOCET) 5-325 MG per tablet 1 tablet: 1 | ORAL | @ 03:00:00 | NDC 63481062301

## 2010-08-02 MED FILL — POTASSIUM CHLORIDE CRYS ER 20 MEQ PO TBCR: 20 MEQ | ORAL | Qty: 2

## 2010-08-02 MED FILL — FOLIC ACID 5 MG/ML IJ SOLN: 5 MG/ML | INTRAMUSCULAR | Qty: 0.2

## 2010-08-02 MED FILL — METRONIDAZOLE 500 MG PO TABS: 500 MG | ORAL | Qty: 1

## 2010-08-02 MED FILL — TOPROL XL 50 MG PO TB24: 50 MG | ORAL | Qty: 1

## 2010-08-02 MED FILL — KCL IN DEXTROSE-NACL 20-5-0.45 MEQ/L-%-% IV SOLN: INTRAVENOUS | Qty: 1000

## 2010-08-02 MED FILL — IBUPROFEN 400 MG PO TABS: 400 MG | ORAL | Qty: 1

## 2010-08-02 MED FILL — MAGNESIUM SULFATE IN D5W 10-5 MG/ML-% IV SOLN: 10-5 MG/ML-% | INTRAVENOUS | Qty: 100

## 2010-08-02 MED FILL — PERCOCET 5-325 MG PO TABS: 5-325 MG | ORAL | Qty: 1

## 2010-08-02 MED FILL — PROTONIX 40 MG IV SOLR: 40 MG | INTRAVENOUS | Qty: 40

## 2010-08-02 NOTE — Plan of Care (Signed)
Problem: Pain - Acute  Goal: Communication of presence of pain  Outcome: Ongoing  Pain is tolerable, denies pain medication. Will continue to monitor comfort.

## 2010-08-02 NOTE — Progress Notes (Signed)
Orders received for discharge. IV DC'd. Patient given discharge instructions. All personal belongings sent with patient.

## 2010-08-02 NOTE — Discharge Summary (Signed)
PATIENT NAME:                   PA #:             MR #Phillip Harper, MCCANLESS                  [id number]        [id number]         ATTENDING PHYSICIAN:                    ADM DATE:      DIS DATE:       Dema Severin, MD                 07/26/2010     08/02/2010      PRIMARY CARE PHYSICIAN:                 REFERRING PHYSICIAN:           Simonne Maffucci, MD                                               DATE OF BIRTH:     AGE:            PATIENT TYPE:      RM #:           1960/11/16         49              IPJ                [id number]               DICTATED BY:  Maryelizabeth Rowan, MD dictating for Hassie Bruce, MD      ADMISSION DIAGNOSIS:  Acute alcoholic pancreatitis.     DISCHARGE DIAGNOSIS(ES):  Acute alcoholic pancreatitis.     ADMISSION CONDITION:  Fair.     DISCHARGE  CONDITION:  Good.     INDICATION FOR ADMISSION:  Acute pancreatitis.      HOSPITAL COURSE:  The patient is a 50 year old male with a significant past  medical history of hypertension, hyperlipidemia, and gout, who presented to  the emergency department on the day of admission with acute onset of upper  abdomen pain.  He states that he had been having a sensation of fullness and  bloating for approximately the prior 3 months; however, not accompanied by  pain.  However, last night following dinner he developed sharp, aching pain  in his epigastrium as well as to his left upper quadrant described as  constant, 10 out of 10, radiating to his back, unrelieved or aggravated by  other factors.  This was also associated with nausea and emesis approximately  5 times of gastric contents over that evening.  He complained of chills and  sweats associated with it; however, no other associated review of systems.   His CAGE questionnaire was positive for 2, notably with two 6-packs of beer  every other day.  He stated that his last drink was approximately 2 days  prior and he denied any previous incidences of alcohol withdrawal.  The  following labs  were obtained upon his arrival:  Chemistries:  Sodium 140,  potassium 3.4, chloride 104, CO2 of  26, BUN of 8, creatinine of 0.83.  CBC:   White blood cell count 12.9, hemoglobin 11.3, platelets 201.  LFTs showed  albumin of 2.9, alkaline phosphatase of 90, ALT 52, AST 44, lipase elevated  at 342, total bilirubin 0.7, direct bilirubin 0.0.  UA was negative.  CT  abdomen and pelvis revealed findings consistent with acute pancreatitis with  peripancreatic fat stranding which was notably negative for pancreatic  necrosis or drainable fluid collections.  He was thereafter admitted for IV  hydration, pain control, and treatment.  He was treated for approximately 4  days with a course of imipenem and cilastatin for a concern of necrotizing  pancreatitis given his white count elevation from 13.2 to about 18.  However,  this was discontinued.  Surgery was consulted for possible concern as  mentioned before for a necrotizing pancreatitis.  However, they recommended  conservative management, which the patient responded very well to.   Approximately on the morning of the 28th, after completing his 4-day course  of Primaxin, he developed watery diarrhea, the C. difficile toxin assay of  which was positive.  He was thereafter begun on a course of oral  metronidazole with subsequent resolution of the diarrhea within about 12  hours.  Throughout his course, he had generalized clinical improvement.  His  diet was advanced.  He was well pain controlled with Percocet.  He was  thereafter deemed suitable for discharge with strict instructions regarding  alcohol intake and dietary restrictions given his post pancreatitis state,  with instructions for followup with his PCP.  He was also given medical work  leave paperwork which the resident physician filled out.     CONDITION ON DISCHARGE:  Good from [facility] on December 29.  The  patient is advised to follow up with his primary care physician within the  next 10-14 days and was  also given a work excuse until January 9.                                            Rochel Brome, MD     [id number]  DD: 08/02/2010 10:49  DT: 08/02/2010 11:22  Job #: [id number]  CC: Simonne Maffucci, MD  CC: Rochel Brome, MD  CC: Dema Severin, MD

## 2010-08-02 NOTE — Plan of Care (Signed)
Problem: Pain - Acute  Goal: Communication of presence of pain  Outcome: Ongoing  PT with c/o back pain. Pt given Percocet per previous RN with adequate pain control. Pt resting at this time with call light in reach. Will cont POC    Problem: Infection - Risk of, Clostridium Difficile Infection  Goal: Absence of infection signs and symptoms  Outcome: Ongoing  Pt remains in contact + isolation for + Cidff culture. Pt on scheduled PO flagyl per orders. Stools less frequent per pt. Will cont POC

## 2010-08-02 NOTE — Progress Notes (Signed)
Surgery Daily Progress Note  Phillip Harper  Subjective:"I had some diarrhea last night"  Pain is Mild, nausea is None       Objective    VITALS:  BP 149/79   Pulse 97   Temp(Src) 97.1 ??F (36.2 ??C) (Oral)   Resp 20   Ht 5\' 9"  (1.753 m)   Wt 270 lb (122.471 kg)   BMI 39.87 kg/m2   SpO2 96%     Infusions:     ??? lactated ringers 100 mL/hr at 08/02/10 0205        I/O:  Intake/Output Summary (Last 24 hours) at 08/02/10 1610  Last data filed at 08/02/10 0600   Gross per 24 hour   Intake 2925.83 ml   Output   2300 ml   Net 625.83 ml              Wt Readings from Last 3 Encounters:   07/26/10 270 lb (122.471 kg)   06/12/10 282 lb (127.914 kg)   03/12/10 273 lb 12.8 oz (124.195 kg)       LABS:  Recent Labs   Basename 08/02/10 0535 08/01/10 1105    WBC 13.3* 17.9*    HGB 11.2* 11.8*    HCT 32.3* 33.8*    MCV 97.8 96.7    PLT 315 309      Recent Labs   Basename 08/02/10 0535 08/01/10 1105    NA 142 140    K 3.3* 3.2*    CL 103 103    CO2 23 23    PHOS -- --    BUN 6* 6*    CREATININE 0.73 0.70      Recent Labs   [facility] 08/01/10 1105 07/31/10 0425    AST 66* 48 48    ALT 62* 50 50    ALB -- --    BILIDIR -- 0.0    BILITOT 0.7 0.8 0.8    ALKPHOS 113 88 88        Recent Labs   Basename 08/01/10 1105 07/31/10 0425    LIPASE 291 319*    AMYLASE -- --        Recent Labs   Basename 08/01/10 1105 07/31/10 0425    PROT 6.9 6.5 6.5    INR -- --    APTT -- --        Exam:  Obese male, NAD   Chest:no respiratory difficulty  Abdomen: mild distension, obese, non tender to palpation.     ASSESSMENT/PLAN: Pt. is a 49 y.o. male with pancreatitis and now suspected C.Diff. Pt with persistent hypokalemia.  ?? Lipase up slightly, but patient not having any pain.  At this time we will continue the patients diet and if he should start having pain we will hold his diet.    ?? Flagyl per primary team  ?? Electrolyte replacement    Tylene Fantasia 08/02/2010 6:27 AM

## 2010-08-02 NOTE — Progress Notes (Signed)
Department of Internal Medicine  Progress Note  10:36 AM12/29/2011  Admit Date: 07/26/2010   Hospital Day: 8                                                   PCP   Deloria Lair, MD  DOB: [birth year]                                                       CodeStatus:Full Code  MRN: [id number]                                                        Diet: Fat Restricted                                                     24 hr interval events:  Fevers and diarrhea resolved, tolerating good PO intake  OBJECTIVE :  Medications        Allergies:    Review of patient's allergies indicates no known allergies.  Current Medications:  Current Facility-Administered Medications   Medication Dose Route Frequency Provider Last Rate Last Dose   ??? magnesium sulfate in dextrose 5% 100 mL infusion 1 g  1 g Intravenous Once Tylene Fantasia, MD   1 g at 08/02/10 0900   ??? potassium chloride SA (K-DUR;KLOR-CON) tablet 40 mEq  40 mEq Oral Once Tylene Fantasia, MD   40 mEq at 08/02/10 0851   ??? dextrose 5 % and 0.45 % NaCl with KCl 20 mEq infusion   Intravenous Continuous Veatrice Bourbon, NP       ??? potassium chloride SA (K-DUR;KLOR-CON) tablet 40 mEq  40 mEq Oral BID [facility] Hassie Bruce, MD   40 mEq at 08/02/10 0851   ??? metronidazole (FLAGYL) tablet 500 mg  500 mg Oral [facility] Nicolette Bang, MD   500 mg at 08/02/10 0544   ??? potassium chloride SA (K-DUR;KLOR-CON) tablet 40 mEq  40 mEq Oral Once Wilfred Curtis., MD   40 mEq at 07/31/10 1023   ??? amlodipine (NORVASC) tablet 5 mg  5 mg Oral Daily Wilfred Curtis., MD   5 mg at 08/02/10 0851   ??? metoprolol (TOPROL-XL) XL tablet 50 mg  50 mg Oral BID Wilfred Curtis., MD   50 mg at 08/02/10 0932   ??? oxycodone-acetaminophen (PERCOCET) 5-325 MG per tablet 1 tablet  1 tablet Oral Q4H PRN Wilfred Curtis., MD   1 tablet at 08/01/10 2227   ??? hydrALAZINE (APRESOLINE) tablet 25 mg  25 mg Oral [facility] Wilfred Curtis., MD   25 mg at 08/02/10 0544   ??? ibuprofen (ADVIL;MOTRIN) tablet 400 mg   400 mg Oral Q6H PRN Tylene Fantasia, MD   400 mg at 08/02/10 0205   ??? potassium chloride 10 mEq/100 mL IVPB peripheral line  10 mEq Intravenous  Q1H Wilfred Curtis., MD   10 mEq at 07/30/10 1008   ??? iopamidol (ISOVUE-370) 76 % injection 100 mL  100 mL Intravenous Once Dema Severin, MD   100 mL at 07/30/10 1543   ??? metoprolol (LOPRESSOR) injection 5 mg  5 mg Intravenous Q6H PRN Velora Heckler, MD   5 mg at 07/30/10 1421   ??? docusate sodium (COLACE) capsule 100 mg  100 mg Oral Daily Santiago Glad, MD   100 mg at 07/31/10 9562   ??? iohexol (OMNIPAQUE) injection 50 mL  50 mL Oral ONCE PRN Renford Dills, MD   50 mL at 07/27/10 1223   ??? iopamidol (ISOVUE-370) 76 % injection 100 mL  100 mL Intravenous Once Renford Dills, MD       ??? bisacodyl (DULCOLAX) suppository 10 mg  10 mg Rectal PRN Santiago Glad, MD   10 mg at 07/28/10 0900   ??? pantoprazole (PROTONIX) injection 40 mg  40 mg Intravenous Daily Santiago Glad, MD   40 mg at 08/02/10 0853   ??? ondansetron [facility]) injection 4 mg  4 mg Intravenous Once Wyn Forster, DO   4 mg at 07/26/10 1156   ??? fentanyl (SUBLIMAZE) injection 50 mcg  50 mcg Intravenous Once Wyn Forster, DO   50 mcg at 07/26/10 1156   ??? HYDROmorphone (DILAUDID) injection 2 mg  2 mg Intravenous Once Wyn Forster, DO   2 mg at 07/26/10 1337   ??? allopurinol (ZYLOPRIM) tablet 300 mg  300 mg Oral Daily Myer Haff, MD   300 mg at 08/02/10 0851   ??? vitamin D (ERGOCALCIFEROL) capsule 50,000 Units  50,000 Units Oral Weekly Myer Haff, MD   50,000 Units at 08/01/10 0836   ??? sodium chloride (PF) 0.9 % injection 10 mL  10 mL Intravenous Q12H [facility] Myer Haff, MD   10 mL at 07/29/10 0900   ??? sodium chloride (PF) 0.9 % injection 10 mL  10 mL Intravenous PRN Myer Haff, MD       ??? ondansetron [facility]) injection 4 mg  4 mg Intravenous Q6H PRN Myer Haff, MD   4 mg at 07/27/10 1308   ??? banana bag IV soln   Intravenous Once Myer Haff, MD       ??? banana bag IV soln    Intravenous Daily Dema Severin, MD       ??? morphine injection 2 mg  2 mg Intravenous Once Sanjuana Mae, MD   2 mg at 07/26/10 2145         Physical    I/O last 3 completed shifts:  In: 2925.8 [P.O.:1080; I.V.:1845.8]  Out: 2300 [Urine:2300]  Patient Vitals for the past 24 hrs:   BP Temp Temp src Pulse Resp SpO2   08/02/10 0645 143/83 mmHg 98.4 ??F (36.9 ??C) Oral 90  20  99 %   08/02/10 0206 149/79 mmHg 97.1 ??F (36.2 ??C) Oral 97  20  96 %   08/01/10 2241 142/90 mmHg 98.5 ??F (36.9 ??C) Oral 93  20  96 %   08/01/10 1610 146/87 mmHg 98.6 ??F (37 ??C) Oral 96  20  96 %   08/01/10 1159 158/80 mmHg 98.8 ??F (37.1 ??C) Oral 107  20  95 %     General appearance: AAO x 3, appears stated age and cooperative, obese, in bed NAD  Skin: Skin color, texture, turgor normal.   HEENT: no lesions, without obvious abnormality, MMM, PERRL, no  LAD, neck supple and NT, no carotid bruit, no JVD, trachea midline and thyroid not enlarged   Lungs: LCTAB, no wheezing.   Heart: tachycardic, sinus rhythm, S1, S2 normal, no m/r/g   Abdomen: obese, NT, distended and tight, + BS; no masses, no organomegaly   Extremities: extremities normal, atraumatic, no cyanosis or edema   Lymphatic: No significant lymph node enlargement papable   Neurologic: CN II-XII grossly intact. Mental status: Alert, oriented, thought content appropriate      Data    LABS:  CBC:   Recent Labs   [facility] 08/02/10 0535    WBC 13.3*    HGB 11.2*    HCT 32.3*    PLT 315    MCV 97.8     Renal:  Recent Labs   Basename 08/02/10 0535 08/01/10 1105    NA 142 --    K 3.3* --    CL 103 --    CO2 23 --    BUN 6* --    CREATININE 0.73 --    GLUCOSE 94 --    CALCIUM 8.3* --    MG -- 2.1    PHOS -- --    ANIONGAP 16 --     Hepatic: Recent Labs   Basename 08/02/10 0535 07/31/10 0425    AST 50 --    ALT 61* --    BILITOT 0.4 --    BILIDIR -- 0.0    PROT 6.4 --    LABALBU 2.9* --    ALKPHOS 108 --         ASSESSMENT AND PLAN    49 yo AAM admitted with pancreatitis.    Pancreatitis-improving   Fevers resolved, wbc# stable  CT scan did not show necrosis of the pancreas. Lipase has come down from 5300 to 300s today with significant improvement in abdominal pain. Pt remains tachycardic and now afebrile, Cultures NGTD.   - Surgery following   -Primaxin d/c'd  -lipase trending down and clinically looks improved with improving appetite  fevers possibly 2/2 drugs vs atelectasis  -willl continue to monitor for now, encouraged IS    HTN: - stable.   Continue home medicines except for lisinopril because of the risk of flaring up of pancreatitis.   - will continue to monitor for now w/PRN metoprolol.     Gout: - stable.   Has history of gout. Would continue with allopurinol.   - Keep indomethacin and colchicine held     Alcohol abuse:   No apparent withdrawal   Did not require any ativan. Denies palpitations, tremors, agitation, headache, sweating or jitteriness. Patient was positive to 2 questions out of 4 for CAGE. Last drink was 5 days ago.   - CIWA protocol. Has not needed it.   - Banana bag.   - counseling.     Diarrhea-resolved    C. Diff tox positive, started flagyl    Disposition     Home today     CodeStatus:Full Code  FEN: Diet: Fat Restricted  PPX:     I have discussed the care of this patient with the attending physician who agrees with plan as detailed above.  Madelyn Flavors, MD PGY1  [phone]

## 2010-08-02 NOTE — Discharge Instructions (Signed)
Follow a Low Fat diet  Complete full course antibiotic treatment   Return to Emergency Department for returning symptoms  Follow up with primary care as needed

## 2010-08-02 NOTE — Progress Notes (Addendum)
[facility]  [address] [place] [address]  Phone: [phone]           08/02/2010    Patient: Phillip Harper   Date of Birth: 1961/01/13   Date of Visit: 08/02/2010       To Whom It May Concern:    [name] was admitted to [facility] from 07/26/2010 to 08/02/2010. He  He may return to work on 08/13/09.      Sincerely,         Madelyn Flavors, MD

## 2010-08-03 MED ORDER — METOPROLOL SUCCINATE ER 50 MG PO TB24
50 MG | ORAL_TABLET | ORAL | Status: DC
Start: 2010-08-03 — End: 2010-11-02

## 2010-08-08 NOTE — Assessment & Plan Note (Signed)
Improved check labs

## 2010-08-08 NOTE — Assessment & Plan Note (Signed)
Improved with Flagyl

## 2010-08-08 NOTE — Assessment & Plan Note (Signed)
Continue current meds

## 2010-08-08 NOTE — Assessment & Plan Note (Signed)
Continue current meds and return in 3 mo.

## 2010-08-08 NOTE — Assessment & Plan Note (Signed)
meds as noted

## 2010-08-08 NOTE — Assessment & Plan Note (Signed)
Improved

## 2010-08-08 NOTE — Progress Notes (Signed)
Subjective:      Patient ID: Phillip Harper is a 50 y.o. male.    HPI Comments: Here today for follow up of chronic problems see problem list, no new c/o feels good was hosp 07/26/10-08/02/10 for acute ETOH Pancreatitis see hosp reports improved now off ETOH to return to work 08/13/10       Review of Systems   Constitutional: Positive for unexpected weight change (down 9# ). Negative for fever and chills.   HENT: Negative.    Eyes: Negative.         [glasses  Respiratory: Positive for shortness of breath (mild DOE).    Cardiovascular: Negative.    Gastrointestinal: Abdominal pain: improved see hosp reports  Diarrhea: improved c diff    Genitourinary: Positive for urgency and frequency.   Musculoskeletal: Negative.         [Gout is stable since taking allopurinol   Skin: Negative.    Neurological: Negative.    Hematological: Negative.    Psychiatric/Behavioral: Negative.        Objective:   Physical Exam   Constitutional: He is oriented to person, place, and time. He appears well-developed and well-nourished.   HENT:   Head: Normocephalic and atraumatic.   Right Ear: External ear normal.   Left Ear: External ear normal.   Nose: Nose normal.   Mouth/Throat: Oropharynx is clear and moist.   Eyes: Conjunctivae and EOM are normal. Pupils are equal, round, and reactive to light.   Neck: Normal range of motion. Neck supple. No tracheal deviation present. No thyromegaly present.   Cardiovascular: Normal rate, regular rhythm, normal heart sounds and intact distal pulses.    No murmur heard.  Pulmonary/Chest: Effort normal and breath sounds normal.   Abdominal: Soft. Bowel sounds are normal.        Obese    Musculoskeletal: Normal range of motion.   Neurological: He is alert and oriented to person, place, and time. He has normal reflexes.   Skin: Skin is warm and dry.   Psychiatric: He has a normal mood and affect. His behavior is normal.       Assessment:              Plan:        Vitamin D deficiency  meds as noted      Hyperlipidemia  Continue current meds and return in 3 mo.        HTN (hypertension)  Continue current meds and return in 3 mo.        Gout  Continue current meds     C. difficile diarrhea  Improved with Flagyl     Alcoholic hepatitis  Improved     Alcohol abuse  DC 07/24/10    Acute alcoholic pancreatitis  Improved check labs

## 2010-08-08 NOTE — Assessment & Plan Note (Signed)
DC 07/24/10

## 2010-08-09 LAB — CBC
Hematocrit: 37.8 % — ABNORMAL LOW (ref 40.5–52.5)
Hemoglobin: 13 gm/dl — ABNORMAL LOW (ref 13.5–17.5)
MCH: 34.2 pg — ABNORMAL HIGH (ref 26–34)
MCHC: 34.4 gm/dl (ref 31–36)
MCV: 99.3 fl (ref 80–100)
MPV: 7.9 fl (ref 5.0–10.5)
Platelets: 615 10*3 — ABNORMAL HIGH (ref 135–450)
RBC: 3.8 10*6 — ABNORMAL LOW (ref 4.2–5.9)
RDW: 14.2 % (ref 11.5–14.5)
WBC: 8.4 10*3 (ref 4.0–11.0)

## 2010-08-09 LAB — COMPREHENSIVE METABOLIC PANEL
ALT: 40 U/L (ref 10–40)
AST: 33 U/L (ref 15–37)
Albumin/Globulin Ratio: 1.1 (ref 1.1–2.2)
Albumin: 4.1 gm/dl (ref 3.4–5.0)
Alkaline Phosphatase: 80 U/L (ref 45–129)
BUN: 7 mg/dl (ref 7–18)
CO2: 26 mEq/L (ref 21–32)
Calcium: 9.5 mg/dl (ref 8.3–10.6)
Chloride: 105 mEq/L (ref 99–110)
Creatinine: 0.7 mg/dl — ABNORMAL LOW (ref 0.9–1.3)
GFR Est, African/Amer: >60
GFR, Estimated: >60 (ref >60)
Glucose: 115 mg/dl — ABNORMAL HIGH (ref 70–99)
Potassium: 4.4 mEq/L (ref 3.5–5.1)
Sodium: 138 mEq/L (ref 136–145)
Total Bilirubin: 0.2 mg/dl (ref 0.0–1.0)
Total Protein: 7.7 gm/dl (ref 6.4–8.2)

## 2010-08-09 LAB — AMYLASE: Amylase: 39 U/L (ref 25–115)

## 2010-08-09 LAB — LIPASE: Lipase: 56.58 U/L — ABNORMAL HIGH (ref 5.6–51.3)

## 2010-09-07 MED ORDER — ALLOPURINOL 300 MG PO TABS
300 MG | ORAL_TABLET | ORAL | Status: DC
Start: 2010-09-07 — End: 2010-12-09

## 2010-11-03 MED ORDER — METOPROLOL SUCCINATE ER 50 MG PO TB24
50 MG | ORAL_TABLET | ORAL | Status: DC
Start: 2010-11-03 — End: 2011-01-31

## 2010-11-05 ENCOUNTER — Telehealth

## 2010-11-05 NOTE — Telephone Encounter (Signed)
Pt has an appointment Wednesday and he needs to know if he should have blood work done before?

## 2010-11-05 NOTE — Telephone Encounter (Signed)
Patient informed that Blood work orders were Left at the front desk

## 2010-11-05 NOTE — Telephone Encounter (Signed)
Yes -CBC,met profile,lipase

## 2010-11-06 LAB — COMPREHENSIVE METABOLIC PANEL
ALT: 38 U/L (ref 10–40)
AST: 37 U/L (ref 15–37)
Albumin/Globulin Ratio: 1.2 (ref 1.1–2.2)
Albumin: 4.4 gm/dl (ref 3.4–5.0)
Alkaline Phosphatase: 62 U/L (ref 45–129)
BUN: 9 mg/dl (ref 7–18)
CO2: 27 mEq/L (ref 21–32)
Calcium: 9.7 mg/dl (ref 8.3–10.6)
Chloride: 104 mEq/L (ref 99–110)
Creatinine: 0.9 mg/dl (ref 0.9–1.3)
GFR Est, African/Amer: >60
GFR, Estimated: >60 (ref >60)
Glucose: 84 mg/dl (ref 70–99)
Potassium: 4.9 mEq/L (ref 3.5–5.1)
Sodium: 139 mEq/L (ref 136–145)
Total Bilirubin: 0.4 mg/dl (ref 0.0–1.0)
Total Protein: 8.1 gm/dl (ref 6.4–8.2)

## 2010-11-06 LAB — CBC WITH AUTO DIFFERENTIAL
Basophils %: 0.4 % (ref 0.0–2.0)
Basophils Absolute: 0 10*3 (ref 0.0–0.2)
Eosinophils %: 7.5 % — ABNORMAL HIGH (ref 0.0–5.0)
Eosinophils Absolute: 0.6 10*3 (ref 0.0–0.6)
Granulocyte Absolute Count: 4.9 10*3 (ref 1.7–7.7)
Hematocrit: 39.1 % — ABNORMAL LOW (ref 40.5–52.5)
Hemoglobin: 13.3 gm/dl — ABNORMAL LOW (ref 13.5–17.5)
Lymphocytes %: 23.1 % — ABNORMAL LOW (ref 25.0–40.0)
Lymphocytes Absolute: 1.9 10*3 (ref 1.0–5.1)
MCH: 32.9 pg (ref 26–34)
MCHC: 34 gm/dl (ref 31–36)
MCV: 96.6 fl (ref 80–100)
MPV: 9.1 fl (ref 5.0–10.5)
Monocytes %: 7.7 % (ref 0.0–12.0)
Monocytes Absolute: 0.6 10*3 (ref 0.0–1.3)
Platelets: 208 10*3 (ref 135–450)
RBC: 4.05 10*6 — ABNORMAL LOW (ref 4.2–5.9)
RDW: 14.4 % (ref 11.5–14.5)
Segs Relative: 61.3 % (ref 42.0–63.0)
WBC: 8.1 10*3 (ref 4.0–11.0)

## 2010-11-06 LAB — LIPID PANEL
Cholesterol, Total: 131 mg/dl (ref <200)
HDL: 46 mg/dl (ref 40–60)
LDL Calculated: 68 mg/dl (ref <100)
Triglycerides: 84 mg/dl (ref <150)
VLDL Cholesterol Calculated: 17 mg/dl

## 2010-11-06 LAB — LIPASE: Lipase: 18.77 U/L (ref 5.6–51.3)

## 2010-11-07 MED ORDER — HYDRALAZINE HCL 25 MG PO TABS
25 MG | ORAL_TABLET | Freq: Three times a day (TID) | ORAL | Status: DC
Start: 2010-11-07 — End: 2011-08-26

## 2010-11-07 NOTE — Assessment & Plan Note (Signed)
No ETOH 12/11

## 2010-11-07 NOTE — Assessment & Plan Note (Signed)
Controled Continue current meds

## 2010-11-07 NOTE — Assessment & Plan Note (Signed)
Continue current meds and return in 3 mo.

## 2010-11-07 NOTE — Assessment & Plan Note (Signed)
Cont vit d 1000/day check labs in 3 mo

## 2010-11-07 NOTE — Progress Notes (Signed)
Subjective:      Patient ID: Phillip Harper is a 50 y.o. male.    HPI Comments: Here today for follow up of chronic problems see problem list, no new c/o feels good no ETOH since 12/11    Hypertension  Associated symptoms include shortness of breath (mild DOE-improved ).   Hyperlipidemia  Associated symptoms include shortness of breath (mild DOE-improved ).       Review of Systems   Constitutional: Negative for fever, chills and unexpected weight change.   HENT: Negative.    Eyes: Negative.         [glasses  Respiratory: Positive for shortness of breath (mild DOE-improved ).    Cardiovascular: Negative.    Gastrointestinal: Negative.  Abdominal pain: improved see hosp reports    Genitourinary: Positive for frequency.   Musculoskeletal: Negative.         [Gout is stable since taking allopurinol   Skin: Negative.    Neurological: Negative.    Hematological: Negative.    Psychiatric/Behavioral: Negative.        Objective:   Physical Exam   Constitutional: He is oriented to person, place, and time. He appears well-developed and well-nourished.   HENT:   Head: Normocephalic and atraumatic.   Nose: Nose normal.   Mouth/Throat: Oropharynx is clear and moist.   Eyes: Conjunctivae and EOM are normal. Pupils are equal, round, and reactive to light.   Neck: Normal range of motion. Neck supple. No tracheal deviation present. No thyromegaly present.   Cardiovascular: Normal rate, regular rhythm, normal heart sounds and intact distal pulses.    No murmur heard.  Pulmonary/Chest: Effort normal and breath sounds normal.   Abdominal: Soft. Bowel sounds are normal.        Obese    Musculoskeletal: Normal range of motion.   Neurological: He is alert and oriented to person, place, and time. He has normal reflexes.   Skin: Skin is warm and dry.   Psychiatric: He has a normal mood and affect. His behavior is normal.       Assessment:        Vitamin D deficiency  Cont vit d 1000/day check labs in 3 mo    Hyperlipidemia  Continue current  meds and return in 3 mo.        HTN (hypertension)  Continue current meds and return in 3 mo.        Gout  Controled Continue current meds     Alcohol abuse  No ETOH 12/11             Plan:

## 2010-12-10 MED ORDER — ALLOPURINOL 300 MG PO TABS
300 MG | ORAL_TABLET | ORAL | Status: DC
Start: 2010-12-10 — End: 2011-03-09

## 2011-01-31 LAB — CBC
Hematocrit: 40 % — ABNORMAL LOW (ref 40.5–52.5)
Hemoglobin: 13.4 gm/dl — ABNORMAL LOW (ref 13.5–17.5)
MCH: 32.7 pg (ref 26–34)
MCHC: 33.5 gm/dl (ref 31–36)
MCV: 97.6 fl (ref 80–100)
MPV: 8.3 fl (ref 5.0–10.5)
Platelets: 211 10*3 (ref 135–450)
RBC: 4.1 10*6 — ABNORMAL LOW (ref 4.2–5.9)
RDW: 14.8 % — ABNORMAL HIGH (ref 11.5–14.5)
WBC: 6.9 10*3 (ref 4.0–11.0)

## 2011-01-31 LAB — COMPREHENSIVE METABOLIC PANEL
ALT: 50 U/L — ABNORMAL HIGH (ref 10–40)
AST: 50 U/L — ABNORMAL HIGH (ref 15–37)
Albumin/Globulin Ratio: 1.2 (ref 1.1–2.2)
Albumin: 4.4 gm/dl (ref 3.4–5.0)
Alkaline Phosphatase: 61 U/L (ref 45–129)
BUN: 11 mg/dl (ref 7–18)
CO2: 23 mEq/L (ref 21–32)
Calcium: 9.7 mg/dl (ref 8.3–10.6)
Chloride: 104 mEq/L (ref 99–110)
Creatinine: 0.9 mg/dl (ref 0.9–1.3)
GFR Est, African/Amer: >60
GFR, Estimated: >60 (ref >60)
Glucose: 94 mg/dl (ref 70–99)
Potassium: 4.2 mEq/L (ref 3.5–5.1)
Sodium: 138 mEq/L (ref 136–145)
Total Bilirubin: 0.5 mg/dl (ref 0.0–1.0)
Total Protein: 8.2 gm/dl (ref 6.4–8.2)

## 2011-01-31 LAB — LIPID PANEL
Cholesterol, Total: 132 mg/dl (ref <200)
HDL: 45 mg/dl (ref 40–60)
LDL Calculated: 68 mg/dl (ref <100)
Triglycerides: 94 mg/dl (ref <150)
VLDL Cholesterol Calculated: 19 mg/dl

## 2011-01-31 LAB — VITAMIN D 25 HYDROXY: Vit D, 25-Hydroxy: 11.4 ng/ml — ABNORMAL LOW

## 2011-02-01 MED ORDER — METOPROLOL SUCCINATE ER 50 MG PO TB24
50 MG | ORAL_TABLET | ORAL | Status: DC
Start: 2011-02-01 — End: 2011-05-04

## 2011-02-04 LAB — TSH: TSH: 1.82 u[IU]/mL (ref 0.35–5.5)

## 2011-02-04 MED ORDER — VITAMIN D (ERGOCALCIFEROL) 1.25 MG (50000 UT) PO CAPS
1.25 MG (50000 UT) | ORAL_CAPSULE | ORAL | Status: DC
Start: 2011-02-04 — End: 2011-05-08

## 2011-02-04 NOTE — Assessment & Plan Note (Signed)
Cont occ ETOH

## 2011-02-04 NOTE — Assessment & Plan Note (Signed)
Improved Continue current meds

## 2011-02-04 NOTE — Assessment & Plan Note (Signed)
vitamin D is low Tx with Vit. D 50,000  Units weekly x12 then repeat labs -then start OTC Vit D 1000u/day

## 2011-02-04 NOTE — Assessment & Plan Note (Signed)
Continue current meds and return in 3 mo.

## 2011-02-04 NOTE — Assessment & Plan Note (Signed)
No recent attacks Continue current meds

## 2011-02-04 NOTE — Progress Notes (Signed)
Subjective:      Patient ID: Phillip Harper is a 50 y.o. male.    HPI Comments: Here today for follow up of chronic problems see problem list, no new c/o feels good     Hypertension  Shortness of breath: mild DOE-improved    Hyperlipidemia  Shortness of breath: mild DOE-improved        Review of Systems   Constitutional: Negative.  Negative for fever, chills and unexpected weight change (up a few # ).   HENT: Negative.    Eyes: Negative.         [glasses  Respiratory: Negative.  Shortness of breath: mild DOE-improved     Cardiovascular: Negative.    Gastrointestinal: Negative.  Abdominal pain: improved see hosp reports    Genitourinary: Positive for frequency.   Musculoskeletal: Negative.         [Gout is stable since taking allopurinol   Skin: Negative.    Neurological: Negative.    Hematological: Negative.    Psychiatric/Behavioral: Negative.        Objective:   Physical Exam   Constitutional: He is oriented to person, place, and time. He appears well-developed and well-nourished.   HENT:   Head: Normocephalic and atraumatic.   Nose: Nose normal.   Mouth/Throat: Oropharynx is clear and moist.   Eyes: Conjunctivae and EOM are normal. Pupils are equal, round, and reactive to light.   Neck: Normal range of motion. Neck supple. No tracheal deviation present. No thyromegaly present.   Cardiovascular: Normal rate, regular rhythm, normal heart sounds and intact distal pulses.    No murmur heard.  Pulmonary/Chest: Effort normal and breath sounds normal.   Abdominal:        Obese    Musculoskeletal: Normal range of motion.   Neurological: He is alert and oriented to person, place, and time. He has normal reflexes.   Skin: Skin is warm and dry.   Psychiatric: He has a normal mood and affect. His behavior is normal.       Assessment:              Plan:        Vitamin D deficiency   vitamin D is low Tx with Vit. D 50,000  Units weekly x12 then repeat labs -then start OTC Vit D 1000u/day      Hyperlipidemia  Improved Continue  current meds     HTN (hypertension)  Continue current meds and return in 3 mo.        Gout  No recent attacks Continue current meds     Alcohol abuse  Cont occ ETOH

## 2011-02-11 MED ORDER — SIMVASTATIN 40 MG PO TABS
40 MG | ORAL_TABLET | ORAL | Status: DC
Start: 2011-02-11 — End: 2011-08-10

## 2011-02-11 MED ORDER — AMLODIPINE BESYLATE 5 MG PO TABS
5 MG | ORAL_TABLET | ORAL | Status: DC
Start: 2011-02-11 — End: 2011-08-10

## 2011-03-11 MED ORDER — ALLOPURINOL 300 MG PO TABS
300 MG | ORAL_TABLET | ORAL | Status: DC
Start: 2011-03-11 — End: 2011-06-11

## 2011-05-06 MED ORDER — METOPROLOL SUCCINATE ER 50 MG PO TB24
50 MG | ORAL_TABLET | ORAL | Status: DC
Start: 2011-05-06 — End: 2011-08-11

## 2011-05-08 NOTE — Assessment & Plan Note (Signed)
No new symptoms check labs

## 2011-05-08 NOTE — Progress Notes (Signed)
Subjective:      Patient ID: Phillip Harper is a 50 y.o. male.    HPI Comments: Here today for follow up of chronic problems see problem list, no new c/o feels good     Hypertension  Shortness of breath: mild DOE-improved    Hyperlipidemia  Shortness of breath: mild DOE-improved        Review of Systems   Constitutional: Negative.  Negative for fever, chills and unexpected weight change (down a few # ).   HENT: Negative.    Eyes: Negative.         [glasses  Respiratory: Negative.  Shortness of breath: mild DOE-improved     Cardiovascular: Negative.    Gastrointestinal: Negative.  Abdominal pain: improved see hosp reports    Genitourinary: Positive for frequency.   Musculoskeletal: Negative.         [Gout is stable since taking allopurinol   Skin: Negative.    Neurological: Negative.    Hematological: Negative.    Psychiatric/Behavioral: Negative.        Objective:   Physical Exam   Constitutional: He is oriented to person, place, and time. He appears well-developed and well-nourished.   HENT:   Head: Normocephalic and atraumatic.   Nose: Nose normal.   Mouth/Throat: Oropharynx is clear and moist.   Eyes: Conjunctivae and EOM are normal. Pupils are equal, round, and reactive to light.   Neck: Normal range of motion. Neck supple. No tracheal deviation present. No thyromegaly present.   Cardiovascular: Normal rate, regular rhythm, normal heart sounds and intact distal pulses.    No murmur heard.  Pulmonary/Chest: Effort normal and breath sounds normal.   Abdominal:        Obese    Musculoskeletal: Normal range of motion.   Neurological: He is alert and oriented to person, place, and time. He has normal reflexes.   Skin: Skin is warm and dry.   Psychiatric: He has a normal mood and affect. His behavior is normal.       Assessment:        Alcohol abuse  occ beer no binge drinking     Alcoholic hepatitis  No new symptoms check labs     Vitamin D deficiency  Finished 50000/week now on 1000/day check labs     HTN  (hypertension)  Continue current meds and return in 3 mo.        Hyperlipidemia  Continue current meds and return in 3 mo.        Gout  No recent symptoms Continue current meds              Plan:

## 2011-05-08 NOTE — Assessment & Plan Note (Signed)
No recent symptoms Continue current meds

## 2011-05-08 NOTE — Assessment & Plan Note (Signed)
Continue current meds and return in 3 mo.

## 2011-05-08 NOTE — Assessment & Plan Note (Signed)
Finished 50000/week now on 1000/day check labs

## 2011-05-08 NOTE — Assessment & Plan Note (Signed)
occ beer no binge drinking

## 2011-05-09 LAB — CBC
Hematocrit: 42.9 % (ref 40.5–52.5)
Hemoglobin: 14.4 gm/dl (ref 13.5–17.5)
MCH: 33.1 pg (ref 26–34)
MCHC: 33.7 gm/dl (ref 31–36)
MCV: 98.1 fl (ref 80–100)
MPV: 8.3 fl (ref 5.0–10.5)
Platelets: 206 10*3 (ref 135–450)
RBC: 4.37 10*6 (ref 4.2–5.9)
RDW: 15.1 % — ABNORMAL HIGH (ref 11.5–14.5)
WBC: 6.1 10*3 (ref 4.0–11.0)

## 2011-05-09 LAB — URIC ACID: Uric Acid, Serum: 5.1 mg/dl (ref 3.5–7.2)

## 2011-05-09 LAB — COMPREHENSIVE METABOLIC PANEL
ALT: 54 U/L — ABNORMAL HIGH (ref 10–40)
AST: 57 U/L — ABNORMAL HIGH (ref 15–37)
Albumin/Globulin Ratio: 1.1 (ref 1.1–2.2)
Albumin: 4.6 gm/dl (ref 3.4–5.0)
Alkaline Phosphatase: 63 U/L (ref 45–129)
BUN: 11 mg/dl (ref 7–18)
CO2: 23 mEq/L (ref 21–32)
Calcium: 9.3 mg/dl (ref 8.3–10.6)
Chloride: 107 mEq/L (ref 99–110)
Creatinine: 0.8 mg/dl — ABNORMAL LOW (ref 0.9–1.3)
GFR Est, African/Amer: >60
GFR, Estimated: >60 (ref >60)
Glucose: 89 mg/dl (ref 70–99)
Potassium: 4.3 mEq/L (ref 3.5–5.1)
Sodium: 138 mEq/L (ref 136–145)
Total Bilirubin: 0.4 mg/dl (ref 0.0–1.0)
Total Protein: 8.8 gm/dl — ABNORMAL HIGH (ref 6.4–8.2)

## 2011-05-09 LAB — TSH: TSH: 1.96 u[IU]/mL (ref 0.35–5.5)

## 2011-05-10 LAB — VITAMIN D 25 HYDROXY: Vit D, 25-Hydroxy: 34 ng/ml (ref 30–80)

## 2011-06-11 MED ORDER — ALLOPURINOL 300 MG PO TABS
300 MG | ORAL_TABLET | ORAL | Status: DC
Start: 2011-06-11 — End: 2011-09-15

## 2011-08-12 MED ORDER — SIMVASTATIN 40 MG PO TABS
40 MG | ORAL_TABLET | ORAL | Status: DC
Start: 2011-08-12 — End: 2012-02-18

## 2011-08-12 MED ORDER — AMLODIPINE BESYLATE 5 MG PO TABS
5 MG | ORAL_TABLET | ORAL | Status: DC
Start: 2011-08-12 — End: 2012-02-15

## 2011-08-12 MED ORDER — METOPROLOL SUCCINATE ER 50 MG PO TB24
50 MG | ORAL_TABLET | ORAL | Status: DC
Start: 2011-08-12 — End: 2011-11-04

## 2011-08-12 NOTE — Assessment & Plan Note (Signed)
Continue current meds and return in 3 mo.

## 2011-08-12 NOTE — Assessment & Plan Note (Signed)
No recent symptoms Continue current meds

## 2011-08-12 NOTE — Assessment & Plan Note (Signed)
Cont to occ drinks

## 2011-08-12 NOTE — Assessment & Plan Note (Signed)
Continue current meds

## 2011-08-12 NOTE — Progress Notes (Signed)
Subjective:      Patient ID: Phillip Harper is a 51 y.o. male.    HPI Comments: Here today for follow up of chronic problems see problem list, no new c/o feels good     Hypertension  Shortness of breath: mild DOE-improved    Hyperlipidemia  Shortness of breath: mild DOE-improved        Review of Systems   Constitutional: Negative.  Negative for fever and chills.   HENT: Negative.    Eyes: Negative.         Glasses   Respiratory: Negative.  Shortness of breath: mild DOE-improved     Cardiovascular: Negative.    Gastrointestinal: Negative.  Abdominal pain: improved see hosp reports    Genitourinary: Positive for frequency.   Musculoskeletal: Negative.         Gout is stable since taking allopurinol    Skin: Negative.    Neurological: Negative.    Hematological: Negative.    Psychiatric/Behavioral: Negative.        Objective:   Physical Exam   Constitutional: He is oriented to person, place, and time. He appears well-developed and well-nourished.   HENT:   Head: Normocephalic and atraumatic.   Nose: Nose normal.   Mouth/Throat: Oropharynx is clear and moist.   Eyes: Conjunctivae and EOM are normal. Pupils are equal, round, and reactive to light.   Neck: Normal range of motion. Neck supple. No tracheal deviation present. No thyromegaly present.   Cardiovascular: Normal rate, regular rhythm, normal heart sounds and intact distal pulses.    No murmur heard.  Pulmonary/Chest: Effort normal and breath sounds normal.   Abdominal:        Obese    Musculoskeletal: Normal range of motion.   Neurological: He is alert and oriented to person, place, and time. He has normal reflexes.   Skin: Skin is warm and dry.   Psychiatric: He has a normal mood and affect. His behavior is normal.       Assessment:        Alcohol abuse  Cont to occ drinks     Alcoholic hepatitis  Last labs ok     Gout  No recent symptoms Continue current meds     HTN (hypertension)  Continue current meds and return in 3 mo.        Hyperlipidemia  Continue  current meds and return in 3 mo.        Vitamin D deficiency  Continue current meds            Plan:

## 2011-08-12 NOTE — Assessment & Plan Note (Signed)
Last labs ok

## 2011-08-26 MED ORDER — HYDRALAZINE HCL 25 MG PO TABS
25 MG | ORAL_TABLET | ORAL | Status: DC
Start: 2011-08-26 — End: 2012-10-03

## 2011-09-16 MED ORDER — ALLOPURINOL 300 MG PO TABS
300 MG | ORAL_TABLET | ORAL | Status: DC
Start: 2011-09-16 — End: 2011-12-14

## 2011-10-07 MED ORDER — HYDRALAZINE HCL 25 MG PO TABS
25 MG | ORAL_TABLET | ORAL | Status: DC
Start: 2011-10-07 — End: 2011-11-06

## 2011-11-05 MED ORDER — METOPROLOL SUCCINATE ER 50 MG PO TB24
50 MG | ORAL_TABLET | ORAL | Status: DC
Start: 2011-11-05 — End: 2012-02-15

## 2011-11-06 MED ORDER — HYDRALAZINE HCL 25 MG PO TABS
25 MG | ORAL_TABLET | Freq: Three times a day (TID) | ORAL | Status: DC
Start: 2011-11-06 — End: 2011-11-07

## 2011-11-07 MED ORDER — HYDRALAZINE HCL 25 MG PO TABS
25 MG | ORAL_TABLET | Freq: Three times a day (TID) | ORAL | Status: DC
Start: 2011-11-07 — End: 2012-02-10

## 2011-11-08 LAB — COMPREHENSIVE METABOLIC PANEL
ALT: 46 U/L — ABNORMAL HIGH (ref 10–40)
AST: 52 U/L — ABNORMAL HIGH (ref 15–37)
Albumin/Globulin Ratio: 1.2 (ref 1.1–2.2)
Albumin: 4.3 gm/dl (ref 3.4–5.0)
Alkaline Phosphatase: 59 U/L (ref 45–129)
BUN: 14 mg/dl (ref 7–18)
CO2: 24 mEq/L (ref 21–32)
Calcium: 9.3 mg/dl (ref 8.3–10.6)
Chloride: 107 mEq/L (ref 99–110)
Creatinine: 0.9 mg/dl (ref 0.9–1.3)
GFR Est, African/Amer: >60
GFR, Estimated: >60 (ref >60)
Glucose: 81 mg/dl (ref 70–99)
Potassium: 4.4 mEq/L (ref 3.5–5.1)
Sodium: 141 mEq/L (ref 136–145)
Total Bilirubin: 0.4 mg/dl (ref 0.0–1.0)
Total Protein: 8.1 gm/dl (ref 6.4–8.2)

## 2011-11-08 LAB — CBC
Hematocrit: 42.7 % (ref 40.5–52.5)
Hemoglobin: 14.2 gm/dl (ref 13.5–17.5)
MCH: 33.1 pg (ref 26–34)
MCHC: 33.3 gm/dl (ref 31–36)
MCV: 99.3 fl (ref 80–100)
MPV: 8.3 fl (ref 5.0–10.5)
Platelets: 208 10*3 (ref 135–450)
RBC: 4.31 10*6 (ref 4.2–5.9)
RDW: 13.9 % (ref 11.5–14.5)
WBC: 6.5 10*3 (ref 4.0–11.0)

## 2011-11-08 LAB — LIPID PANEL
Cholesterol, Total: 133 mg/dl (ref <200)
HDL: 48 mg/dl (ref 40–60)
LDL Calculated: 53 mg/dl (ref <100)
Triglycerides: 160 mg/dl — ABNORMAL HIGH (ref <150)
VLDL Cholesterol Calculated: 32 mg/dl

## 2011-11-08 LAB — URIC ACID: Uric Acid, Serum: 4.6 mg/dl (ref 3.5–7.2)

## 2011-11-08 LAB — TSH: TSH: 2.54 u[IU]/mL (ref 0.35–5.5)

## 2011-11-11 LAB — VITAMIN D 25 HYDROXY: Vit D, 25-Hydroxy: 21 ng/ml — ABNORMAL LOW (ref 30–80)

## 2011-11-11 NOTE — Assessment & Plan Note (Signed)
Improved

## 2011-11-11 NOTE — Assessment & Plan Note (Signed)
Continue current meds and return in 3 mo.

## 2011-11-11 NOTE — Assessment & Plan Note (Signed)
occ symptoms meds as noted

## 2011-11-11 NOTE — Assessment & Plan Note (Signed)
A little low increase to 2000/day

## 2011-11-11 NOTE — Progress Notes (Signed)
Subjective:      Patient ID: Phillip Harper is a 51 y.o. male.    HPI Comments: Here today for follow up of chronic problems see problem list, no new c/o feels good     Hypertension  Shortness of breath: mild DOE-improved    Hyperlipidemia  Shortness of breath: mild DOE-improved        Review of Systems   Constitutional: Negative.  Negative for fever and chills.   HENT: Negative.    Eyes: Negative.         Glasses   Respiratory: Negative.  Shortness of breath: mild DOE-improved     Cardiovascular: Negative.    Gastrointestinal: Negative.    Endocrine: Negative.    Genitourinary: Positive for frequency.   Musculoskeletal: Negative.         Gout is stable since taking allopurinol    Skin: Negative.         Hx of Keloids   Neurological: Negative.    Psychiatric/Behavioral: Negative.        Objective:   Physical Exam   Constitutional: He is oriented to person, place, and time. He appears well-developed and well-nourished.   HENT:   Head: Normocephalic and atraumatic.   Nose: Nose normal.   Mouth/Throat: Oropharynx is clear and moist.   Eyes: Conjunctivae and EOM are normal. Pupils are equal, round, and reactive to light.   Neck: Normal range of motion. Neck supple. No tracheal deviation present. No thyromegaly present.   Cardiovascular: Normal rate, regular rhythm, normal heart sounds and intact distal pulses.    No murmur heard.  Pulmonary/Chest: Effort normal and breath sounds normal.   Abdominal:   Obese    Musculoskeletal: Normal range of motion.   Neurological: He is alert and oriented to person, place, and time. He has normal reflexes.   Skin: Skin is warm and dry.   Psychiatric: He has a normal mood and affect. His behavior is normal.       Assessment:          Vitamin d deficiency  A little low increase to 2000/day     Hyperlipidemia  Continue current meds and return in 3 mo.        HTN (hypertension)  Continue current meds and return in 3 mo.        Gout  occ symptoms meds as noted     Alcoholic  hepatitis  Improved     Alcohol abuse  Has decrease a little            Plan:

## 2011-11-11 NOTE — Assessment & Plan Note (Signed)
Has decrease a little

## 2011-12-16 MED ORDER — ALLOPURINOL 300 MG PO TABS
300 MG | ORAL_TABLET | ORAL | Status: DC
Start: 2011-12-16 — End: 2012-03-15

## 2012-02-10 NOTE — Assessment & Plan Note (Signed)
Continue current meds and return in 3 mo.

## 2012-02-10 NOTE — Progress Notes (Signed)
Subjective:      Patient ID: Phillip Harper is a 51 y.o. male.    HPI Comments: Here today for follow up of chronic problems see problem list, no new c/o feels good     Hypertension  Shortness of breath: mild DOE-improved    Hyperlipidemia  Shortness of breath: mild DOE-improved        Review of Systems   Constitutional: Positive for unexpected weight change (up a few # ). Negative for fever and chills.   HENT: Negative.    Eyes: Negative.         Glasses   Respiratory: Negative.  Shortness of breath: mild DOE-improved     Cardiovascular: Negative.    Gastrointestinal: Negative.    Endocrine: Negative.    Genitourinary: Positive for frequency.   Musculoskeletal: Negative.         Gout is stable since taking allopurinol    Skin: Negative.         Hx of Keloids   Neurological: Negative.    Psychiatric/Behavioral: Negative.        Objective:   Physical Exam   Constitutional: He is oriented to person, place, and time. He appears well-developed and well-nourished.   HENT:   Head: Normocephalic and atraumatic.   Nose: Nose normal.   Mouth/Throat: Oropharynx is clear and moist.   Eyes: Conjunctivae and EOM are normal. Pupils are equal, round, and reactive to light.   Neck: Normal range of motion. Neck supple. No tracheal deviation present. No thyromegaly present.   Cardiovascular: Normal rate, regular rhythm, normal heart sounds and intact distal pulses.    No murmur heard.  Pulmonary/Chest: Effort normal and breath sounds normal.   Abdominal: Soft. Bowel sounds are normal.   Obese    Musculoskeletal: Normal range of motion.   Neurological: He is alert and oriented to person, place, and time. He has normal reflexes.   Skin: Skin is warm and dry.   Psychiatric: He has a normal mood and affect. His behavior is normal.       Assessment:              Plan:        Gout  occ symptoms needs to watch diet     Hyperlipidemia  Continue current meds and return in 3 mo.    check labs in 3 mo     HTN (hypertension)  Continue current  meds and return in 3 mo.        Vitamin d deficiency  Continue current meds labs in 3 mo     Alcoholic hepatitis  Needs to DC beer

## 2012-02-10 NOTE — Assessment & Plan Note (Signed)
Continue current meds and return in 3 mo.    check labs in 3 mo

## 2012-02-10 NOTE — Assessment & Plan Note (Signed)
occ symptoms needs to watch diet

## 2012-02-10 NOTE — Assessment & Plan Note (Signed)
Needs to DC beer

## 2012-02-10 NOTE — Assessment & Plan Note (Signed)
Continue current meds labs in 3 mo

## 2012-02-17 MED ORDER — AMLODIPINE BESYLATE 5 MG PO TABS
5 MG | ORAL_TABLET | ORAL | Status: DC
Start: 2012-02-17 — End: 2012-08-11

## 2012-02-17 MED ORDER — METOPROLOL SUCCINATE ER 50 MG PO TB24
50 MG | ORAL_TABLET | ORAL | Status: DC
Start: 2012-02-17 — End: 2012-05-17

## 2012-02-18 MED ORDER — SIMVASTATIN 40 MG PO TABS
40 MG | ORAL_TABLET | ORAL | Status: DC
Start: 2012-02-18 — End: 2012-08-12

## 2012-03-16 MED ORDER — ALLOPURINOL 300 MG PO TABS
300 MG | ORAL_TABLET | ORAL | Status: DC
Start: 2012-03-16 — End: 2012-09-11

## 2012-05-12 LAB — URINALYSIS
Bilirubin Urine: NEGATIVE mg/dL
Blood, Urine: NEGATIVE
Glucose, Ur: NEGATIVE mg/dL
Ketones, Urine: NEGATIVE mg/dL
Leukocyte Esterase, Urine: NEGATIVE
Nitrite, Urine: NEGATIVE
Protein, UA: NEGATIVE mg/dL
Specific Gravity, UA: >=1.03 (ref 1.005–1.030)
Urobilinogen, Urine: 0.2 E.U./dL (ref <2.0)
pH, UA: 6 (ref 5.0–8.0)

## 2012-05-12 LAB — COMPREHENSIVE METABOLIC PANEL
ALT: 38 U/L (ref 10–40)
AST: 48 U/L — ABNORMAL HIGH (ref 15–37)
Albumin/Globulin Ratio: 1.3 (ref 1.1–2.2)
Albumin: 4.7 g/dL (ref 3.4–5.0)
Alkaline Phosphatase: 58 U/L (ref 45–129)
BUN: 8 mg/dL (ref 7–18)
CO2: 25 mEq/L (ref 21–32)
Calcium: 9.4 mg/dL (ref 8.3–10.6)
Chloride: 106 mEq/L (ref 99–110)
Creatinine: 0.9 mg/dL (ref 0.9–1.3)
GFR African American: >60 (ref >60)
GFR Non-African American: >60 (ref >60)
Globulin: 4 g/dL
Glucose: 96 mg/dL (ref 70–99)
Potassium: 4.8 mEq/L (ref 3.5–5.1)
Sodium: 139 mEq/L (ref 136–145)
Total Bilirubin: 0.5 mg/dL (ref 0.00–1.00)
Total Protein: 8.4 g/dL — ABNORMAL HIGH (ref 6.4–8.2)

## 2012-05-12 LAB — CBC
Hematocrit: 41.5 % (ref 40.5–52.5)
Hemoglobin: 13.7 g/dL (ref 13.5–17.5)
MCH: 33.8 pg (ref 26.0–34.0)
MCHC: 33.1 g/dL (ref 31.0–36.0)
MCV: 102.2 fL — ABNORMAL HIGH (ref 80.0–100.0)
MPV: 8.7 fL (ref 5.0–10.5)
Platelets: 213 10*3/uL (ref 135–450)
RBC: 4.07 M/uL — ABNORMAL LOW (ref 4.20–5.90)
RDW: 13.4 % (ref 12.4–15.4)
WBC: 6.2 10*3/uL (ref 4.0–11.0)

## 2012-05-12 LAB — TSH,NO REFLEX: TSH: 1.45 u[IU]/mL (ref 0.35–5.50)

## 2012-05-12 LAB — PSA SCREENING: PSA: 3.09 ng/mL (ref 0.00–4.00)

## 2012-05-12 LAB — LIPID PANEL
Cholesterol, Total: 122 mg/dL (ref 0–199)
HDL: 36 mg/dL — ABNORMAL LOW (ref 40–60)
LDL Calculated: 72 mg/dL (ref 0–99)
Triglycerides: 71 mg/dL (ref 0–149)
VLDL Cholesterol Calculated: 14 mg/dL

## 2012-05-14 LAB — VITAMIN D 25 HYDROXY: Vit D, 25-Hydroxy: 31 ng/mL (ref 30–80)

## 2012-05-15 NOTE — Assessment & Plan Note (Signed)
No recent problems

## 2012-05-15 NOTE — Progress Notes (Signed)
Subjective:      Patient ID: Phillip Harper is a 51 y.o. male.    HPI Comments: Here today for complete physical and review of listed chronic problem       Review of Systems   Constitutional: Negative.  Unexpected weight change: up a few.   HENT: Negative.    Eyes: Negative.         Glasses   Respiratory: Negative.    Cardiovascular: Negative.    Gastrointestinal: Negative.    Endocrine: Negative.    Genitourinary: Positive for frequency.   Musculoskeletal: Negative.         Gout is stable since taking allopurinol    Skin: Negative.    Allergic/Immunologic: Negative.    Neurological: Negative.    Hematological: Negative.    Psychiatric/Behavioral: Negative.        Objective:   Physical Exam   Constitutional: He is oriented to person, place, and time. He appears well-developed and well-nourished.   HENT:   Head: Normocephalic and atraumatic.   Right Ear: External ear normal.   Left Ear: External ear normal.   Nose: Nose normal.   Mouth/Throat: Oropharynx is clear and moist.   Eyes: Conjunctivae and EOM are normal. Pupils are equal, round, and reactive to light.   Neck: Normal range of motion. Neck supple. No tracheal deviation present. No thyromegaly present.   Cardiovascular: Normal rate, regular rhythm, normal heart sounds and intact distal pulses.    No murmur heard.  Pulmonary/Chest: Effort normal and breath sounds normal.   Abdominal: Soft. Bowel sounds are normal.   obese   Genitourinary: Rectum normal, prostate normal and penis normal. Guaiac negative stool.   Musculoskeletal: Normal range of motion. He exhibits tenderness (low back ).   Neurological: He is alert and oriented to person, place, and time. He has normal reflexes.   Skin: Skin is warm and dry.   Psychiatric: He has a normal mood and affect. His behavior is normal.       Assessment:              Plan:        HTN (hypertension)  Continue current meds and return in 3 mo.        Hyperlipidemia  Continue current meds and return in 3 mo.        Vitamin  D deficiency  Continue current meds and return in 3 mo.        Gout  No recent problems     Alcohol abuse  Improved only occ beer     Well adult exam  Normal for age  for age send for colonoscopy , exercise and lose wt avoid ETOH

## 2012-05-15 NOTE — Addendum Note (Signed)
Addended by: Severiano Gilbert on: 05/15/2012 03:00 PM     Modules accepted: Orders

## 2012-05-15 NOTE — Assessment & Plan Note (Signed)
Normal for age  for age send for colonoscopy , exercise and lose wt avoid ETOH

## 2012-05-15 NOTE — Assessment & Plan Note (Signed)
Continue current meds and return in 3 mo.

## 2012-05-15 NOTE — Assessment & Plan Note (Signed)
Improved only occ beer

## 2012-05-16 LAB — TESTOSTERONE FREE AND TOTAL MALE
Sex Hormone Binding: 46 nmol/L (ref 11–80)
Testosterone % Free: 1.5 % — ABNORMAL LOW (ref 1.6–2.9)
Testosterone Free, Calc: 55 pg/mL (ref 47–244)
Total Testosterone: 364 ng/dL (ref 300–890)

## 2012-05-18 MED ORDER — METOPROLOL SUCCINATE ER 50 MG PO TB24
50 MG | ORAL_TABLET | ORAL | Status: DC
Start: 2012-05-18 — End: 2012-08-28

## 2012-08-11 MED ORDER — AMLODIPINE BESYLATE 5 MG PO TABS
5 MG | ORAL_TABLET | ORAL | Status: DC
Start: 2012-08-11 — End: 2013-02-04

## 2012-08-12 MED ORDER — SIMVASTATIN 40 MG PO TABS
40 MG | ORAL_TABLET | ORAL | Status: DC
Start: 2012-08-12 — End: 2013-02-04

## 2012-08-17 NOTE — Assessment & Plan Note (Signed)
Continue current meds and return in 3 mo.

## 2012-08-17 NOTE — Progress Notes (Signed)
Subjective:      Patient ID: Phillip Harper is a 52 y.o. male.    HPI Comments: Here today for follow up of chronic problems see problem list, no new c/o feels good     Hypertension    Hyperlipidemia        Review of Systems   Constitutional: Positive for unexpected weight change (down a few # ).   HENT: Negative.    Eyes: Negative.         Glasses   Respiratory: Negative.    Cardiovascular: Negative.    Gastrointestinal: Negative.    Endocrine: Negative.    Genitourinary: Positive for frequency.   Musculoskeletal: Negative.         Gout is stable since taking allopurinol    Skin: Negative.    Allergic/Immunologic: Negative.    Neurological: Negative.    Hematological: Negative.    Psychiatric/Behavioral: Negative.        Objective:   Physical Exam   Constitutional: He is oriented to person, place, and time. He appears well-developed and well-nourished.   HENT:   Head: Normocephalic and atraumatic.   Nose: Nose normal.   Eyes: Conjunctivae and EOM are normal. Pupils are equal, round, and reactive to light.   Neck: Normal range of motion. Neck supple. No tracheal deviation present. No thyromegaly present.   Cardiovascular: Normal rate, regular rhythm, normal heart sounds and intact distal pulses.    No murmur heard.  Pulmonary/Chest: Effort normal and breath sounds normal.   Abdominal:   obese   Musculoskeletal: Normal range of motion. He exhibits tenderness (low back improved ).   Neurological: He is alert and oriented to person, place, and time. He has normal reflexes.   Skin: Skin is warm and dry.   Psychiatric: He has a normal mood and affect. His behavior is normal.       Assessment:        Alcohol abuse  Still drinking a 6 pack/week     Gout  No symptoms     HTN (hypertension)  Continue current meds and return in 3 mo.        Hyperlipidemia  Continue current meds and return in 3 mo.        Vitamin D deficiency  Continue current meds            Plan:

## 2012-08-17 NOTE — Assessment & Plan Note (Signed)
Continue current meds

## 2012-08-17 NOTE — Assessment & Plan Note (Signed)
Still drinking a 6 pack/week

## 2012-08-17 NOTE — Assessment & Plan Note (Signed)
No symptoms

## 2012-08-31 MED ORDER — METOPROLOL SUCCINATE ER 50 MG PO TB24
50 MG | ORAL_TABLET | ORAL | Status: DC
Start: 2012-08-31 — End: 2012-12-14

## 2012-09-11 MED ORDER — ALLOPURINOL 300 MG PO TABS
300 MG | ORAL_TABLET | ORAL | Status: DC
Start: 2012-09-11 — End: 2013-03-10

## 2012-10-06 MED ORDER — HYDRALAZINE HCL 25 MG PO TABS
25 MG | ORAL_TABLET | ORAL | Status: DC
Start: 2012-10-06 — End: 2013-07-26

## 2012-11-13 LAB — COMPREHENSIVE METABOLIC PANEL
ALT: 37 U/L (ref 10–40)
AST: 60 U/L — ABNORMAL HIGH (ref 15–37)
Albumin/Globulin Ratio: 1.1 (ref 1.1–2.2)
Albumin: 4.2 g/dL (ref 3.4–5.0)
Alkaline Phosphatase: 65 U/L (ref 40–129)
BUN: 11 mg/dL (ref 7–20)
CO2: 24 mmol/L (ref 21–32)
Calcium: 9.2 mg/dL (ref 8.3–10.6)
Chloride: 102 mmol/L (ref 99–110)
Creatinine: 0.8 mg/dL — ABNORMAL LOW (ref 0.9–1.3)
GFR African American: >60 (ref >60)
GFR Non-African American: >60 (ref >60)
Globulin: 3.8 g/dL
Glucose: 118 mg/dL — ABNORMAL HIGH (ref 70–99)
Potassium: 4.7 mmol/L (ref 3.5–5.1)
Sodium: 137 mmol/L (ref 136–145)
Total Bilirubin: 0.4 mg/dL (ref 0.0–1.0)
Total Protein: 8 g/dL (ref 6.4–8.2)

## 2012-11-13 LAB — LIPID PANEL
Cholesterol, Total: 123 mg/dL (ref 0–199)
HDL: 54 mg/dL (ref 40–60)
LDL Calculated: 46 mg/dL (ref <100)
Triglycerides: 116 mg/dL (ref 0–150)
VLDL Cholesterol Calculated: 23 mg/dL

## 2012-11-13 LAB — CBC WITH AUTO DIFFERENTIAL
Basophils %: 0.5 %
Basophils Absolute: 0 10*3/uL (ref 0.0–0.2)
Eosinophils %: 2.5 %
Eosinophils Absolute: 0.2 10*3/uL (ref 0.0–0.6)
Hematocrit: 43.3 % (ref 40.5–52.5)
Hemoglobin: 14.3 g/dL (ref 13.5–17.5)
Lymphocytes %: 30.2 %
Lymphocytes Absolute: 2 10*3/uL (ref 1.0–5.1)
MCH: 32.3 pg (ref 26.0–34.0)
MCHC: 33 g/dL (ref 31.0–36.0)
MCV: 97.7 fL (ref 80.0–100.0)
MPV: 8.6 fL (ref 5.0–10.5)
Monocytes %: 10.7 %
Monocytes Absolute: 0.7 10*3/uL (ref 0.0–1.3)
Neutrophils %: 56.1 %
Neutrophils Absolute: 3.6 10*3/uL (ref 1.7–7.7)
Platelets: 231 10*3/uL (ref 135–450)
RBC: 4.44 M/uL (ref 4.20–5.90)
RDW: 14.3 % (ref 12.4–15.4)
WBC: 6.4 10*3/uL (ref 4.0–11.0)

## 2012-11-13 LAB — HEMOGLOBIN A1C
Hemoglobin A1C: 5.9 %
eAG: 122.6 mg/dL

## 2012-11-13 LAB — TSH: TSH: 1.74 u[IU]/mL (ref 0.27–4.20)

## 2012-11-13 LAB — URIC ACID: Uric Acid, Serum: 5.3 mg/dL (ref 3.5–7.2)

## 2012-11-16 LAB — VITAMIN D 25 HYDROXY: Vit D, 25-Hydroxy: 26 ng/mL — ABNORMAL LOW (ref 30–80)

## 2012-11-17 NOTE — Assessment & Plan Note (Signed)
Continue current meds and return in 3 mo.

## 2012-11-17 NOTE — Progress Notes (Signed)
Subjective:      Patient ID: Phillip Harper is a 52 y.o. male.    HPI Comments: Here today for follow up of chronic problems see problem list, no new c/o feels good     Hypertension    Hyperlipidemia        Review of Systems   Constitutional: Negative.  Unexpected weight change: up a few #    HENT: Negative.    Eyes: Negative.         Glasses   Respiratory: Negative.    Cardiovascular: Negative.    Gastrointestinal: Negative.    Endocrine: Negative.    Genitourinary: Positive for frequency.   Musculoskeletal: Positive for back pain (occ sx ). Negative for gait problem.        Gout is stable since taking allopurinol    Skin: Negative.    Allergic/Immunologic: Negative.    Neurological: Negative.    Hematological: Negative.    Psychiatric/Behavioral: Negative.        Objective:   Physical Exam   Constitutional: He is oriented to person, place, and time. He appears well-developed and well-nourished.   HENT:   Head: Normocephalic and atraumatic.   Nose: Nose normal.   Mouth/Throat: Oropharynx is clear and moist.   Eyes: Conjunctivae and EOM are normal. Pupils are equal, round, and reactive to light.   Neck: Normal range of motion. Neck supple. No tracheal deviation present. No thyromegaly present.   Cardiovascular: Normal rate, regular rhythm, normal heart sounds and intact distal pulses.    No murmur heard.  Pulmonary/Chest: Effort normal and breath sounds normal.   Abdominal:   obese   Musculoskeletal: Normal range of motion. He exhibits tenderness (low back improved ).   Neurological: He is alert and oriented to person, place, and time. He has normal reflexes.   Skin: Skin is warm and dry.   Psychiatric: He has a normal mood and affect. His behavior is normal.       Assessment:        Acute alcoholic pancreatitis  No new /c/o but cont to drink occ     Alcohol abuse  Needs to DC     Gout  Cont meds occ symptoms     HTN (hypertension)  Continue current meds and return in 3 mo.        Hyperlipidemia  Continue current  meds and return in 3 mo.        Vitamin D deficiency  Continue current meds             Plan:

## 2012-11-17 NOTE — Assessment & Plan Note (Signed)
Needs to DC

## 2012-11-17 NOTE — Assessment & Plan Note (Signed)
Cont meds occ symptoms

## 2012-11-17 NOTE — Assessment & Plan Note (Signed)
Continue current meds

## 2012-11-17 NOTE — Assessment & Plan Note (Signed)
No new /c/o but cont to drink occ

## 2012-12-15 ENCOUNTER — Telehealth

## 2012-12-15 MED ORDER — METOPROLOL SUCCINATE ER 50 MG PO TB24
50 MG | ORAL_TABLET | ORAL | Status: DC
Start: 2012-12-15 — End: 2013-06-08

## 2012-12-15 NOTE — Telephone Encounter (Signed)
Pt would like to discuss a sleep study he is suppose to have and needing an order. Patient would like if someone could call him back today.

## 2012-12-15 NOTE — Telephone Encounter (Signed)
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 Does patient need to come in for an appointment or who do you recommend?

## 2012-12-15 NOTE — Telephone Encounter (Signed)
Ok to send him for sleep study

## 2012-12-16 NOTE — Telephone Encounter (Signed)
Patient called back sleep order will be at the front desk.

## 2012-12-16 NOTE — Telephone Encounter (Signed)
Left message on machine for patient to call back.

## 2013-02-04 MED ORDER — SIMVASTATIN 40 MG PO TABS
40 MG | ORAL_TABLET | ORAL | Status: DC
Start: 2013-02-04 — End: 2013-07-28

## 2013-02-04 MED ORDER — AMLODIPINE BESYLATE 5 MG PO TABS
5 MG | ORAL_TABLET | ORAL | Status: DC
Start: 2013-02-04 — End: 2013-07-28

## 2013-02-16 MED ORDER — COLCHICINE 0.6 MG PO TABS
0.6 MG | ORAL_TABLET | ORAL | Status: DC
Start: 2013-02-16 — End: 2015-03-15

## 2013-02-16 MED ORDER — INDOMETHACIN 25 MG PO CAPS
25 MG | ORAL_CAPSULE | Freq: Two times a day (BID) | ORAL | Status: DC
Start: 2013-02-16 — End: 2016-11-18

## 2013-02-16 NOTE — Assessment & Plan Note (Signed)
Continue current meds and return in 3 mo.

## 2013-02-16 NOTE — Assessment & Plan Note (Signed)
occ  Beer/week

## 2013-02-16 NOTE — Assessment & Plan Note (Signed)
Recent attack Tx as noted meds refilled

## 2013-02-16 NOTE — Assessment & Plan Note (Signed)
Continue current meds

## 2013-02-16 NOTE — Progress Notes (Signed)
Subjective:      Patient ID: Phillip Harper is a 52 y.o. male.    HPI Comments: Here today for follow up of chronic problems see problem list, no new c/o feels good occ gout attack , colonoscopy was neg sleep study results pending       Review of Systems   Constitutional: Positive for unexpected weight change (down 10#).   HENT: Negative.    Eyes: Negative.         Glasses   Respiratory: Negative.    Cardiovascular: Negative.    Gastrointestinal: Negative.         Normal colonoscopy 7/14    Endocrine: Negative.    Genitourinary: Positive for frequency.   Musculoskeletal: Positive for back pain (occ sx ). Negative for gait problem.        Gout is stable since taking allopurinol    Skin: Negative.    Allergic/Immunologic: Negative.    Neurological: Negative.    Hematological: Negative.    Psychiatric/Behavioral: Negative.        Objective:   Physical Exam   Constitutional: He is oriented to person, place, and time. He appears well-developed and well-nourished.   HENT:   Head: Normocephalic and atraumatic.   Nose: Nose normal.   Mouth/Throat: Oropharynx is clear and moist.   Eyes: Conjunctivae and EOM are normal. Pupils are equal, round, and reactive to light.   Neck: Normal range of motion. Neck supple. No tracheal deviation present. No thyromegaly present.   Cardiovascular: Normal rate, regular rhythm, normal heart sounds and intact distal pulses.    No murmur heard.  Pulmonary/Chest: Effort normal and breath sounds normal.   Abdominal:   obese   Musculoskeletal: Normal range of motion. Tenderness: low back improved    Neurological: He is alert and oriented to person, place, and time. He has normal reflexes.   Skin: Skin is warm and dry.   Psychiatric: He has a normal mood and affect. His behavior is normal.       Assessment:              Plan:        Alcohol abuse  occ  Beer/week     Gout  Recent attack Tx as noted meds refilled     HTN (hypertension)  Continue current meds and return in 3 mo.         Hyperlipidemia  Continue current meds and return in 3 mo.        Vitamin D deficiency  Continue current meds

## 2013-03-11 MED ORDER — ALLOPURINOL 300 MG PO TABS
300 MG | ORAL_TABLET | ORAL | Status: DC
Start: 2013-03-11 — End: 2013-09-24

## 2013-05-17 LAB — LIPID PANEL
Cholesterol, Total: 127 mg/dL (ref 0–199)
HDL: 52 mg/dL (ref 40–60)
LDL Calculated: 56 mg/dL (ref <100)
Triglycerides: 97 mg/dL (ref 0–150)
VLDL Cholesterol Calculated: 19 mg/dL

## 2013-05-17 LAB — CBC WITH AUTO DIFFERENTIAL
Basophils %: 0.4 %
Basophils Absolute: 0 10*3/uL (ref 0.0–0.2)
Eosinophils %: 3 %
Eosinophils Absolute: 0.2 10*3/uL (ref 0.0–0.6)
Hematocrit: 43 % (ref 40.5–52.5)
Hemoglobin: 14.5 g/dL (ref 13.5–17.5)
Lymphocytes %: 25.8 %
Lymphocytes Absolute: 1.8 10*3/uL (ref 1.0–5.1)
MCH: 33.4 pg (ref 26.0–34.0)
MCHC: 33.7 g/dL (ref 31.0–36.0)
MCV: 99.2 fL (ref 80.0–100.0)
MPV: 8.8 fL (ref 5.0–10.5)
Monocytes %: 8.4 %
Monocytes Absolute: 0.6 10*3/uL (ref 0.0–1.3)
Neutrophils %: 62.4 %
Neutrophils Absolute: 4.4 10*3/uL (ref 1.7–7.7)
Platelets: 216 10*3/uL (ref 135–450)
RBC: 4.34 M/uL (ref 4.20–5.90)
RDW: 14.3 % (ref 12.4–15.4)
WBC: 7.1 10*3/uL (ref 4.0–11.0)

## 2013-05-17 LAB — URINALYSIS
Bilirubin Urine: NEGATIVE
Blood, Urine: NEGATIVE
Glucose, Ur: NEGATIVE mg/dL
Ketones, Urine: NEGATIVE mg/dL
Leukocyte Esterase, Urine: NEGATIVE
Nitrite, Urine: NEGATIVE
Protein, UA: NEGATIVE mg/dL
Specific Gravity, UA: 1.025 (ref 1.005–1.030)
Urobilinogen, Urine: 0.2 E.U./dL (ref <2.0)
pH, UA: 6 (ref 5.0–8.0)

## 2013-05-17 LAB — COMPREHENSIVE METABOLIC PANEL
ALT: 39 U/L (ref 10–40)
AST: 69 U/L — ABNORMAL HIGH (ref 15–37)
Albumin/Globulin Ratio: 1.1 (ref 1.1–2.2)
Albumin: 4.3 g/dL (ref 3.4–5.0)
Alkaline Phosphatase: 70 U/L (ref 40–129)
BUN: 7 mg/dL (ref 7–20)
CO2: 20 mmol/L — ABNORMAL LOW (ref 21–32)
Calcium: 9.1 mg/dL (ref 8.3–10.6)
Chloride: 103 mmol/L (ref 99–110)
Creatinine: 0.7 mg/dL — ABNORMAL LOW (ref 0.9–1.3)
GFR African American: >60 (ref >60)
GFR Non-African American: >60 (ref >60)
Globulin: 3.9 g/dL
Glucose: 94 mg/dL (ref 70–99)
Potassium: 4.4 mmol/L (ref 3.5–5.1)
Sodium: 139 mmol/L (ref 136–145)
Total Bilirubin: 0.4 mg/dL (ref 0.0–1.0)
Total Protein: 8.2 g/dL (ref 6.4–8.2)

## 2013-05-17 LAB — URIC ACID: Uric Acid, Serum: 4.4 mg/dL (ref 3.5–7.2)

## 2013-05-17 LAB — PSA PROSTATIC SPECIFIC ANTIGEN: Diagnostic Psa: 3.78 ng/mL (ref 0.00–4.00)

## 2013-05-17 LAB — TSH: TSH: 1.87 u[IU]/mL (ref 0.27–4.20)

## 2013-05-19 LAB — VITAMIN D 25 HYDROXY: Vit D, 25-Hydroxy: 28 ng/mL — ABNORMAL LOW (ref 30–80)

## 2013-05-19 NOTE — Assessment & Plan Note (Signed)
Improved Continue current meds

## 2013-05-19 NOTE — Assessment & Plan Note (Signed)
No recent symptoms

## 2013-05-19 NOTE — Assessment & Plan Note (Signed)
occ beer 1-2/week

## 2013-05-19 NOTE — Progress Notes (Signed)
Subjective:      Patient ID: Phillip Harper is a 52 y.o. male.    HPI Comments: Here today for follow up of chronic problems see problem list, no new c/o feels good       Review of Systems   Constitutional: Negative.  Unexpected weight change: up a few.   HENT: Negative.    Eyes: Negative.         Glasses   Respiratory: Negative.         Sleep study was within normal limits no  c-pap needed    Cardiovascular: Negative.    Gastrointestinal: Negative.    Endocrine: Negative.    Genitourinary: Positive for frequency.   Musculoskeletal: Negative.         Gout is stable since taking allopurinol    Skin: Negative.    Allergic/Immunologic: Negative.    Neurological: Negative.    Hematological: Negative.    Psychiatric/Behavioral: Negative.        Objective:   Physical Exam   Constitutional: He is oriented to person, place, and time. He appears well-developed and well-nourished.   HENT:   Head: Normocephalic and atraumatic.   Right Ear: External ear normal.   Left Ear: External ear normal.   Nose: Nose normal.   Mouth/Throat: Oropharynx is clear and moist.   Eyes: Conjunctivae and EOM are normal. Pupils are equal, round, and reactive to light.   Neck: Normal range of motion. Neck supple. No tracheal deviation present. No thyromegaly present.   Cardiovascular: Normal rate, regular rhythm, normal heart sounds and intact distal pulses.    No murmur heard.  Pulmonary/Chest: Effort normal and breath sounds normal.   Abdominal: Soft. Bowel sounds are normal.   obese   Genitourinary: Rectum normal, prostate normal and penis normal. Guaiac negative stool.   Musculoskeletal: Normal range of motion. He exhibits tenderness (low back ).   Neurological: He is alert and oriented to person, place, and time. He has normal reflexes.   Skin: Skin is warm and dry.   Psychiatric: He has a normal mood and affect. His behavior is normal.       Assessment:              Plan:        Alcohol abuse  occ beer 1-2/week     Gout  No recent symptoms      HTN (hypertension)  Continue current meds and return in 3 mo.        Hyperlipidemia  Continue current meds and return in 3 mo.        Vitamin D deficiency  Improved Continue current meds     Well adult exam  Within normal limits for age- cont to work no ADL issues,immunizations up to date, no depression ,no cognitive impairment  Colonoscopy up to date  Eye exam up to date  Exercises as tolerated -walks 3-4x/week   No living will but does not want resuscitation   Findings and recommendations discussed with Pt

## 2013-05-19 NOTE — Assessment & Plan Note (Signed)
Continue current meds and return in 3 mo.

## 2013-05-19 NOTE — Assessment & Plan Note (Signed)
Within normal limits for age- cont to work no ADL issues,immunizations up to date, no depression ,no cognitive impairment  Colonoscopy up to date  Eye exam up to date  Exercises as tolerated -walks 3-4x/week   No living will but does not want resuscitation   Findings and recommendations discussed with Pt

## 2013-06-08 MED ORDER — METOPROLOL SUCCINATE ER 50 MG PO TB24
50 MG | ORAL_TABLET | ORAL | Status: DC
Start: 2013-06-08 — End: 2013-12-23

## 2013-07-26 MED ORDER — HYDRALAZINE HCL 25 MG PO TABS
25 MG | ORAL_TABLET | ORAL | Status: DC
Start: 2013-07-26 — End: 2014-04-23

## 2013-07-28 MED ORDER — SIMVASTATIN 40 MG PO TABS
40 MG | ORAL_TABLET | ORAL | Status: DC
Start: 2013-07-28 — End: 2014-01-21

## 2013-07-28 MED ORDER — AMLODIPINE BESYLATE 5 MG PO TABS
5 MG | ORAL_TABLET | ORAL | Status: DC
Start: 2013-07-28 — End: 2014-01-21

## 2013-08-20 NOTE — Assessment & Plan Note (Signed)
Continue current meds

## 2013-08-20 NOTE — Assessment & Plan Note (Signed)
No recent symptoms

## 2013-08-20 NOTE — Assessment & Plan Note (Signed)
Continue current meds and return in 3 mo.

## 2013-08-20 NOTE — Progress Notes (Signed)
Subjective:      Patient ID: Phillip Harper is a 53 y.o. male.    HPI Comments: Here today for follow up of chronic problems see problem list, no new c/o feels good       Review of Systems   Constitutional: Negative.  Unexpected weight change: up a few.   HENT: Negative.    Eyes: Negative.         Glasses   Respiratory: Negative.         Sleep study was within normal limits no  c-pap needed    Cardiovascular: Negative.    Gastrointestinal: Negative.    Endocrine: Negative.    Genitourinary: Positive for frequency.   Musculoskeletal: Negative.         Gout is stable since taking allopurinol    Skin: Negative.    Allergic/Immunologic: Negative.    Neurological: Negative.    Hematological: Negative.    Psychiatric/Behavioral: Negative.        Objective:   Physical Exam   Constitutional: He is oriented to person, place, and time. He appears well-developed and well-nourished.   HENT:   Head: Normocephalic and atraumatic.   Nose: Nose normal.   Mouth/Throat: Oropharynx is clear and moist.   Eyes: Conjunctivae and EOM are normal. Pupils are equal, round, and reactive to light.   Neck: Normal range of motion. Neck supple. No tracheal deviation present. No thyromegaly present.   Cardiovascular: Normal rate, regular rhythm, normal heart sounds and intact distal pulses.    No murmur heard.  Pulmonary/Chest: Effort normal and breath sounds normal.   Abdominal:   obese   Musculoskeletal: Normal range of motion. He exhibits tenderness (low back ).   Neurological: He is alert and oriented to person, place, and time. He has normal reflexes.   Skin: Skin is warm and dry.   Psychiatric: He has a normal mood and affect. His behavior is normal.       Assessment:        Alcohol abuse  Still has occ beer     Gout  No recent symptoms     HTN (hypertension)  Continue current meds and return in 3 mo.        Hyperlipidemia  Continue current meds and return in 3 mo.        Vitamin D deficiency  Continue current meds            Plan:

## 2013-08-20 NOTE — Assessment & Plan Note (Signed)
Still has occ beer

## 2013-09-28 MED ORDER — ALLOPURINOL 300 MG PO TABS
300 MG | ORAL_TABLET | ORAL | Status: DC
Start: 2013-09-28 — End: 2014-03-23

## 2013-11-18 LAB — COMPREHENSIVE METABOLIC PANEL
ALT: 36 U/L (ref 10–40)
AST: 48 U/L — ABNORMAL HIGH (ref 15–37)
Albumin/Globulin Ratio: 1.1 (ref 1.1–2.2)
Albumin: 4.2 g/dL (ref 3.4–5.0)
Alkaline Phosphatase: 65 U/L (ref 40–129)
Anion Gap: 18 — ABNORMAL HIGH (ref 3–16)
BUN: 9 mg/dL (ref 7–20)
CO2: 21 mmol/L (ref 21–32)
Calcium: 9 mg/dL (ref 8.3–10.6)
Chloride: 101 mmol/L (ref 99–110)
Creatinine: 0.7 mg/dL — ABNORMAL LOW (ref 0.9–1.3)
GFR African American: >60 (ref >60)
GFR Non-African American: >60 (ref >60)
Globulin: 3.9 g/dL
Glucose: 100 mg/dL — ABNORMAL HIGH (ref 70–99)
Potassium: 4.4 mmol/L (ref 3.5–5.1)
Sodium: 140 mmol/L (ref 136–145)
Total Bilirubin: <0.2 mg/dL (ref 0.0–1.0)
Total Protein: 8.1 g/dL (ref 6.4–8.2)

## 2013-11-18 LAB — CBC WITH AUTO DIFFERENTIAL
Basophils %: 0.5 %
Basophils Absolute: 0 10*3/uL (ref 0.0–0.2)
Eosinophils %: 2.5 %
Eosinophils Absolute: 0.2 10*3/uL (ref 0.0–0.6)
Hematocrit: 39.9 % — ABNORMAL LOW (ref 40.5–52.5)
Hemoglobin: 13.6 g/dL (ref 13.5–17.5)
Lymphocytes %: 35.1 %
Lymphocytes Absolute: 2.7 10*3/uL (ref 1.0–5.1)
MCH: 33.3 pg (ref 26.0–34.0)
MCHC: 34 g/dL (ref 31.0–36.0)
MCV: 98.2 fL (ref 80.0–100.0)
MPV: 8.5 fL (ref 5.0–10.5)
Monocytes %: 8.9 %
Monocytes Absolute: 0.7 10*3/uL (ref 0.0–1.3)
Neutrophils %: 53 %
Neutrophils Absolute: 4.1 10*3/uL (ref 1.7–7.7)
Platelets: 240 10*3/uL (ref 135–450)
RBC: 4.06 M/uL — ABNORMAL LOW (ref 4.20–5.90)
RDW: 15.2 % (ref 12.4–15.4)
WBC: 7.7 10*3/uL (ref 4.0–11.0)

## 2013-11-18 LAB — URIC ACID: Uric Acid, Serum: 5 mg/dL (ref 3.5–7.2)

## 2013-11-18 LAB — LIPID PANEL
Cholesterol, Total: 123 mg/dL (ref 0–199)
HDL: 40 mg/dL (ref 40–60)
LDL Calculated: 59 mg/dL (ref <100)
Triglycerides: 121 mg/dL (ref 0–150)
VLDL Cholesterol Calculated: 24 mg/dL

## 2013-11-18 LAB — VITAMIN D 25 HYDROXY: Vit D, 25-Hydroxy: 37.5 ng/mL (ref >=30)

## 2013-11-18 LAB — TSH: TSH: 1.38 u[IU]/mL (ref 0.27–4.20)

## 2013-11-26 NOTE — Assessment & Plan Note (Addendum)
Cont to try and decrease as noted  Only 2-3 beers/ week

## 2013-11-26 NOTE — Assessment & Plan Note (Signed)
Continue current meds and return in 3 mo.

## 2013-11-26 NOTE — Assessment & Plan Note (Signed)
No new symptoms

## 2013-11-26 NOTE — Assessment & Plan Note (Signed)
Improved as noted

## 2013-11-26 NOTE — Progress Notes (Signed)
Subjective:      Patient ID: Phillip Harper is a 53 y.o. male.    HPI Comments: Here today for follow up of chronic problems see problem list, no new c/o feels good     Hypertension    Hyperlipidemia        Review of Systems   Constitutional: Positive for unexpected weight change (down a few # ).   HENT: Negative.    Eyes: Negative.         Glasses   Respiratory: Negative.         Sleep study was within normal limits no  c-pap needed    Cardiovascular: Negative.    Gastrointestinal: Negative.    Endocrine: Negative.    Genitourinary: Positive for frequency.   Musculoskeletal: Negative.         Gout is stable since taking allopurinol    Skin: Negative.    Allergic/Immunologic: Negative.    Neurological: Negative.    Hematological: Negative.    Psychiatric/Behavioral: Negative.        Objective:   Physical Exam   Constitutional: He is oriented to person, place, and time. He appears well-developed and well-nourished.   HENT:   Head: Normocephalic and atraumatic.   Nose: Nose normal.   Mouth/Throat: Oropharynx is clear and moist.   Eyes: Conjunctivae and EOM are normal. Pupils are equal, round, and reactive to light.   Neck: Normal range of motion. Neck supple. No tracheal deviation present. No thyromegaly present.   Cardiovascular: Normal rate, regular rhythm, normal heart sounds and intact distal pulses.    No murmur heard.  Pulmonary/Chest: Effort normal and breath sounds normal.   Abdominal:   obese   Musculoskeletal: Normal range of motion. Tenderness: low back improved    Neurological: He is alert and oriented to person, place, and time. He has normal reflexes.   Skin: Skin is warm and dry.   Psychiatric: He has a normal mood and affect. His behavior is normal.       Assessment:              Plan:        Alcohol abuse  Cont to try and decrease as noted  Only 2-3 beers/ week     Gout  No new symptoms     HTN (hypertension)  Continue current meds and return in 3 mo.        Hyperlipidemia  Continue current meds and  return in 3 mo.        Vitamin D deficiency  Improved as noted

## 2013-12-24 MED ORDER — METOPROLOL SUCCINATE ER 50 MG PO TB24
50 MG | ORAL_TABLET | ORAL | Status: DC
Start: 2013-12-24 — End: 2014-06-19

## 2014-01-21 MED ORDER — AMLODIPINE BESYLATE 5 MG PO TABS
5 MG | ORAL_TABLET | ORAL | Status: DC
Start: 2014-01-21 — End: 2014-07-24

## 2014-01-21 MED ORDER — SIMVASTATIN 40 MG PO TABS
40 MG | ORAL_TABLET | ORAL | Status: DC
Start: 2014-01-21 — End: 2015-03-15

## 2014-03-01 NOTE — Assessment & Plan Note (Signed)
Stable continue current meds and return in 3 mo.

## 2014-03-01 NOTE — Assessment & Plan Note (Signed)
No recent symptoms

## 2014-03-01 NOTE — Assessment & Plan Note (Signed)
occ 2-3 beers/ week

## 2014-03-01 NOTE — Progress Notes (Signed)
Subjective:      Patient ID: Phillip Harper is a 53 y.o. male.    HPI Comments: Here today for follow up of chronic problems see problem list, no new c/o feels good     Hypertension    Hyperlipidemia        Review of Systems   Constitutional: Positive for unexpected weight change (down a few # ).   HENT: Negative.    Eyes: Negative.         Glasses   Respiratory: Negative.         Sleep study was within normal limits no  c-pap needed    Cardiovascular: Negative.    Gastrointestinal: Negative.    Endocrine: Negative.    Genitourinary: Positive for frequency.   Musculoskeletal: Negative.         Gout is stable since taking allopurinol    Skin: Negative.    Allergic/Immunologic: Negative.    Neurological: Negative.    Hematological: Negative.    Psychiatric/Behavioral: Negative.        Objective:   Physical Exam   Constitutional: He is oriented to person, place, and time. He appears well-developed and well-nourished.   HENT:   Head: Normocephalic and atraumatic.   Nose: Nose normal.   Mouth/Throat: Oropharynx is clear and moist.   Eyes: Conjunctivae and EOM are normal. Pupils are equal, round, and reactive to light.   Neck: Normal range of motion. Neck supple. No tracheal deviation present. No thyromegaly present.   Cardiovascular: Normal rate, regular rhythm, normal heart sounds and intact distal pulses.    No murmur heard.  Pulmonary/Chest: Effort normal and breath sounds normal.   Abdominal:   obese   Musculoskeletal: Normal range of motion. Tenderness: low back improved    Neurological: He is alert and oriented to person, place, and time. He has normal reflexes.   Skin: Skin is warm and dry.   Psychiatric: He has a normal mood and affect. His behavior is normal.       Assessment:              Plan:        Alcohol abuse  occ 2-3 beers/ week     Gout  No recent symptoms     HTN (hypertension)  Stable continue current meds and return in 3 mo.        Hyperlipidemia  Stable continue current meds and return in 3 mo.         Vitamin D deficiency  Continue current meds

## 2014-03-01 NOTE — Assessment & Plan Note (Signed)
Continue current meds

## 2014-03-23 MED ORDER — ALLOPURINOL 300 MG PO TABS
300 MG | ORAL_TABLET | ORAL | Status: DC
Start: 2014-03-23 — End: 2014-09-02

## 2014-03-23 MED ORDER — SIMVASTATIN 40 MG PO TABS
40 MG | ORAL_TABLET | ORAL | Status: DC
Start: 2014-03-23 — End: 2014-06-01

## 2014-04-23 NOTE — Progress Notes (Signed)
Test not completed

## 2014-04-25 MED ORDER — HYDRALAZINE HCL 25 MG PO TABS
25 MG | ORAL_TABLET | ORAL | Status: DC
Start: 2014-04-25 — End: 2015-01-18

## 2014-05-23 LAB — COMPREHENSIVE METABOLIC PANEL
ALT: 44 U/L — ABNORMAL HIGH (ref 10–40)
AST: 88 U/L — ABNORMAL HIGH (ref 15–37)
Albumin/Globulin Ratio: 1.1 (ref 1.1–2.2)
Albumin: 4.6 g/dL (ref 3.4–5.0)
Alkaline Phosphatase: 67 U/L (ref 40–129)
Anion Gap: 16 (ref 3–16)
BUN: 10 mg/dL (ref 7–20)
CO2: 22 mmol/L (ref 21–32)
Calcium: 9.2 mg/dL (ref 8.3–10.6)
Chloride: 103 mmol/L (ref 99–110)
Creatinine: 0.7 mg/dL — ABNORMAL LOW (ref 0.9–1.3)
GFR African American: >60 (ref >60)
GFR Non-African American: >60 (ref >60)
Globulin: 4.1 g/dL
Glucose: 95 mg/dL (ref 70–99)
Potassium: 4.2 mmol/L (ref 3.5–5.1)
Sodium: 141 mmol/L (ref 136–145)
Total Bilirubin: 0.4 mg/dL (ref 0.0–1.0)
Total Protein: 8.7 g/dL — ABNORMAL HIGH (ref 6.4–8.2)

## 2014-05-23 LAB — CBC WITH AUTO DIFFERENTIAL
Basophils %: 0.8 %
Basophils Absolute: 0 10*3/uL (ref 0.0–0.2)
Eosinophils %: 2.3 %
Eosinophils Absolute: 0.1 10*3/uL (ref 0.0–0.6)
Hematocrit: 41.9 % (ref 40.5–52.5)
Hemoglobin: 13.4 g/dL — ABNORMAL LOW (ref 13.5–17.5)
Lymphocytes %: 34.1 %
Lymphocytes Absolute: 1.9 10*3/uL (ref 1.0–5.1)
MCH: 31.3 pg (ref 26.0–34.0)
MCHC: 32.1 g/dL (ref 31.0–36.0)
MCV: 97.4 fL (ref 80.0–100.0)
MPV: 8.2 fL (ref 5.0–10.5)
Monocytes %: 9 %
Monocytes Absolute: 0.5 10*3/uL (ref 0.0–1.3)
Neutrophils %: 53.8 %
Neutrophils Absolute: 2.9 10*3/uL (ref 1.7–7.7)
Platelets: 209 10*3/uL (ref 135–450)
RBC: 4.3 M/uL (ref 4.20–5.90)
RDW: 17.9 % — ABNORMAL HIGH (ref 12.4–15.4)
WBC: 5.5 10*3/uL (ref 4.0–11.0)

## 2014-05-23 LAB — LIPID PANEL
Cholesterol, Total: 126 mg/dL (ref 0–199)
HDL: 63 mg/dL — ABNORMAL HIGH (ref 40–60)
LDL Calculated: 40 mg/dL (ref <100)
Triglycerides: 116 mg/dL (ref 0–150)
VLDL Cholesterol Calculated: 23 mg/dL

## 2014-05-23 LAB — URINALYSIS
Bilirubin Urine: NEGATIVE
Blood, Urine: NEGATIVE
Glucose, Ur: NEGATIVE mg/dL
Ketones, Urine: NEGATIVE mg/dL
Nitrite, Urine: NEGATIVE
Protein, UA: NEGATIVE mg/dL
Specific Gravity, UA: 1.024 (ref 1.005–1.030)
Urobilinogen, Urine: 0.2 E.U./dL (ref <2.0)
pH, UA: 5.5 (ref 5.0–8.0)

## 2014-05-23 LAB — MICROSCOPIC URINALYSIS
Epithelial Cells, UA: 3 /HPF (ref 0–5)
Hyaline Casts, UA: 2 /HPF (ref 0–8)
RBC, UA: 3 /HPF (ref 0–4)
WBC, UA: 4 /HPF (ref 0–5)

## 2014-05-23 LAB — URIC ACID: Uric Acid, Serum: 4.1 mg/dL (ref 3.5–7.2)

## 2014-05-23 LAB — PSA SCREENING: PSA: 4.7 ng/mL — ABNORMAL HIGH (ref 0.00–4.00)

## 2014-05-23 LAB — TSH: TSH: 1.81 u[IU]/mL (ref 0.27–4.20)

## 2014-05-23 LAB — VITAMIN D 25 HYDROXY: Vit D, 25-Hydroxy: 38.6 ng/mL (ref >=30)

## 2014-06-01 ENCOUNTER — Ambulatory Visit
Admit: 2014-06-01 | Discharge: 2014-06-01 | Payer: BLUE CROSS/BLUE SHIELD | Attending: Internal Medicine | Primary: Internal Medicine

## 2014-06-01 DIAGNOSIS — IMO0001 Reserved for inherently not codable concepts without codable children: Secondary | ICD-10-CM

## 2014-06-01 MED ORDER — MELOXICAM 7.5 MG PO TABS
7.5 MG | ORAL_TABLET | Freq: Every day | ORAL | Status: DC
Start: 2014-06-01 — End: 2014-09-02

## 2014-06-01 NOTE — Assessment & Plan Note (Signed)
Within normal limits for age- cont to work no ADL issues,immunizations up to date, no depression ,no cognitive impairment  Colonoscopy up to date  Eye exam up to date  Exercises as tolerated    No living will but does not want resuscitation   Findings and recommendations discussed with Pt

## 2014-06-01 NOTE — Assessment & Plan Note (Signed)
No recent symptoms meds as noted

## 2014-06-01 NOTE — Assessment & Plan Note (Signed)
Stable continue current meds and return in 3 mo.

## 2014-06-01 NOTE — Assessment & Plan Note (Signed)
Cont with beer needs to DC since LFT are up slightly

## 2014-06-01 NOTE — Progress Notes (Signed)
Subjective:      Patient ID: Phillip Harper is a 53 y.o. male.    HPI Comments: Here today for complete physical and review of listed chronic problem       Review of Systems   Constitutional: Negative.  Unexpected weight change: up a few.   HENT: Negative.    Eyes: Negative.         Glasses   Respiratory: Negative.         Sleep study was within normal limits no  c-pap needed    Cardiovascular: Negative.    Gastrointestinal: Negative.    Endocrine: Negative.    Genitourinary: Positive for urgency and frequency.        Nocturia    Musculoskeletal: Negative.         Gout is stable since taking allopurinol    Skin: Negative.    Allergic/Immunologic: Negative.    Neurological: Negative.    Hematological: Negative.    Psychiatric/Behavioral: Negative.        Objective:   Physical Exam   Constitutional: He is oriented to person, place, and time. He appears well-developed and well-nourished.   HENT:   Head: Normocephalic and atraumatic.   Right Ear: External ear normal.   Left Ear: External ear normal.   Nose: Nose normal.   Mouth/Throat: Oropharynx is clear and moist.   Eyes: Conjunctivae and EOM are normal. Pupils are equal, round, and reactive to light.   Neck: Normal range of motion. Neck supple. No tracheal deviation present. No thyromegaly present.   Cardiovascular: Normal rate, regular rhythm, normal heart sounds and intact distal pulses.    No murmur heard.  Pulmonary/Chest: Effort normal and breath sounds normal.   Abdominal: Soft. Bowel sounds are normal.   obese   Genitourinary: Rectum normal, prostate normal and penis normal. Guaiac negative stool.   Musculoskeletal: Normal range of motion. He exhibits tenderness (low back ).   Neurological: He is alert and oriented to person, place, and time. He has normal reflexes.   Skin: Skin is warm and dry.   Psychiatric: He has a normal mood and affect. His behavior is normal.       Assessment:              Plan:        Gout  No recent symptoms meds as noted      Hyperlipidemia  Stable continue current meds and return in 3 mo.        HTN (hypertension)  Stable continue current meds and return in 3 mo.        Vitamin D deficiency  Continue current meds     Alcohol abuse  Cont with beer needs to DC since LFT are up slightly     Well adult exam  Within normal limits for age- cont to work no ADL issues,immunizations up to date, no depression ,no cognitive impairment  Colonoscopy up to date  Eye exam up to date  Exercises as tolerated    No living will but does not want resuscitation   Findings and recommendations discussed with Pt     Elevated PSA  Slightly >4.0 refer to Dr Vicente Males

## 2014-06-01 NOTE — Assessment & Plan Note (Signed)
Slightly >4.0 refer to Dr Vicente Males

## 2014-06-01 NOTE — Assessment & Plan Note (Signed)
Continue current meds

## 2014-06-20 MED ORDER — METOPROLOL SUCCINATE ER 50 MG PO TB24
50 MG | ORAL_TABLET | ORAL | Status: DC
Start: 2014-06-20 — End: 2014-12-13

## 2014-07-25 MED ORDER — AMLODIPINE BESYLATE 5 MG PO TABS
5 MG | ORAL_TABLET | ORAL | Status: DC
Start: 2014-07-25 — End: 2015-02-15

## 2014-09-02 ENCOUNTER — Ambulatory Visit
Admit: 2014-09-02 | Discharge: 2014-09-02 | Payer: BLUE CROSS/BLUE SHIELD | Attending: Internal Medicine | Primary: Internal Medicine

## 2014-09-02 DIAGNOSIS — I10 Essential (primary) hypertension: Secondary | ICD-10-CM

## 2014-09-02 MED ORDER — MELOXICAM 7.5 MG PO TABS
7.5 MG | ORAL_TABLET | Freq: Every day | ORAL | Status: DC
Start: 2014-09-02 — End: 2015-03-15

## 2014-09-02 MED ORDER — ALLOPURINOL 300 MG PO TABS
300 MG | ORAL_TABLET | ORAL | Status: DC
Start: 2014-09-02 — End: 2015-03-10

## 2014-09-02 NOTE — Assessment & Plan Note (Signed)
Continue current meds

## 2014-09-02 NOTE — Assessment & Plan Note (Signed)
Stable continue current meds and return in 3 mo.

## 2014-09-02 NOTE — Assessment & Plan Note (Signed)
Cont with Dr Vicente Males as noted

## 2014-09-02 NOTE — Assessment & Plan Note (Signed)
No recent symptoms meds as noted

## 2014-09-02 NOTE — Assessment & Plan Note (Signed)
Cont to drink 3-4 cans of beer/week

## 2014-09-02 NOTE — Progress Notes (Signed)
Subjective:      Patient ID: Phillip Harper is a 54 y.o. male.    HPI Comments: Here today for follow up of chronic problems see problem list, no new c/o feels good     Hypertension    Hyperlipidemia        Review of Systems   Constitutional: Negative.  Unexpected weight change: up a few.   HENT: Negative.    Eyes: Negative.         Glasses   Respiratory: Negative.         Sleep study was within normal limits no  c-pap needed    Cardiovascular: Negative.    Gastrointestinal: Negative.    Endocrine: Negative.    Genitourinary: Positive for urgency and frequency.        Nocturia - elevated PSA as noted cont with Dr Vicente Males    Musculoskeletal: Negative.         Gout is stable since taking allopurinol    Skin: Negative.    Allergic/Immunologic: Negative.    Neurological: Negative.    Hematological: Negative.    Psychiatric/Behavioral: Negative.        Objective:   Physical Exam   Constitutional: He is oriented to person, place, and time. He appears well-developed and well-nourished.   HENT:   Head: Normocephalic and atraumatic.   Nose: Nose normal.   Mouth/Throat: Oropharynx is clear and moist.   Eyes: Conjunctivae and EOM are normal. Pupils are equal, round, and reactive to light.   Neck: Normal range of motion. Neck supple. No tracheal deviation present. No thyromegaly present.   Cardiovascular: Normal rate, regular rhythm, normal heart sounds and intact distal pulses.    No murmur heard.  Pulmonary/Chest: Effort normal and breath sounds normal.   Abdominal:   obese   Musculoskeletal: Normal range of motion. He exhibits tenderness (low back ).   Neurological: He is alert and oriented to person, place, and time. He has normal reflexes.   Skin: Skin is warm and dry.   Psychiatric: He has a normal mood and affect. His behavior is normal.       Assessment:              Plan:        Alcohol abuse  Cont to drink 3-4 cans of beer/week       Elevated PSA  Cont with Dr Vicente Males as noted       Gout  No recent symptoms meds as  noted       HTN (hypertension)  Stable continue current meds and return in 3 mo.          Hyperlipidemia  Stable continue current meds and return in 3 mo.          Vitamin D deficiency  Continue current meds

## 2014-10-19 LAB — CBC WITH AUTO DIFFERENTIAL
Basophils %: 0.8 %
Basophils Absolute: 0 10*3/uL (ref 0.0–0.2)
Eosinophils %: 2.6 %
Eosinophils Absolute: 0.1 10*3/uL (ref 0.0–0.6)
Hematocrit: 42.7 % (ref 40.5–52.5)
Hemoglobin: 14.2 g/dL (ref 13.5–17.5)
Lymphocytes %: 37 %
Lymphocytes Absolute: 1.9 10*3/uL (ref 1.0–5.1)
MCH: 33.3 pg (ref 26.0–34.0)
MCHC: 33.2 g/dL (ref 31.0–36.0)
MCV: 100.1 fL — ABNORMAL HIGH (ref 80.0–100.0)
MPV: 8.1 fL (ref 5.0–10.5)
Monocytes %: 6.8 %
Monocytes Absolute: 0.3 10*3/uL (ref 0.0–1.3)
Neutrophils %: 52.8 %
Neutrophils Absolute: 2.7 10*3/uL (ref 1.7–7.7)
Platelets: 203 10*3/uL (ref 135–450)
RBC: 4.26 M/uL (ref 4.20–5.90)
RDW: 15.6 % — ABNORMAL HIGH (ref 12.4–15.4)
WBC: 5.1 10*3/uL (ref 4.0–11.0)

## 2014-10-19 LAB — LIPID PANEL
Cholesterol, Total: 140 mg/dL (ref 0–199)
HDL: 53 mg/dL (ref 40–60)
LDL Calculated: 47 mg/dL (ref <100)
Triglycerides: 198 mg/dL — ABNORMAL HIGH (ref 0–150)
VLDL Cholesterol Calculated: 40 mg/dL

## 2014-10-19 LAB — COMPREHENSIVE METABOLIC PANEL
ALT: 55 U/L — ABNORMAL HIGH (ref 10–40)
AST: 97 U/L — ABNORMAL HIGH (ref 15–37)
Albumin/Globulin Ratio: 1 — ABNORMAL LOW (ref 1.1–2.2)
Albumin: 4.3 g/dL (ref 3.4–5.0)
Alkaline Phosphatase: 72 U/L (ref 40–129)
Anion Gap: 16 (ref 3–16)
BUN: 8 mg/dL (ref 7–20)
CO2: 25 mmol/L (ref 21–32)
Calcium: 9 mg/dL (ref 8.3–10.6)
Chloride: 103 mmol/L (ref 99–110)
Creatinine: 0.8 mg/dL — ABNORMAL LOW (ref 0.9–1.3)
GFR African American: >60 (ref >60)
GFR Non-African American: >60 (ref >60)
Globulin: 4.4 g/dL
Glucose: 115 mg/dL — ABNORMAL HIGH (ref 70–99)
Potassium: 4.8 mmol/L (ref 3.5–5.1)
Sodium: 144 mmol/L (ref 136–145)
Total Bilirubin: 0.3 mg/dL (ref 0.0–1.0)
Total Protein: 8.7 g/dL — ABNORMAL HIGH (ref 6.4–8.2)

## 2014-10-19 LAB — URIC ACID: Uric Acid, Serum: 4.7 mg/dL (ref 3.5–7.2)

## 2014-10-19 LAB — PSA SCREENING: PSA: 5.42 ng/mL — ABNORMAL HIGH (ref 0.00–4.00)

## 2014-10-19 LAB — VITAMIN D 25 HYDROXY: Vit D, 25-Hydroxy: 39.8 ng/mL (ref >=30)

## 2014-10-26 ENCOUNTER — Ambulatory Visit
Admit: 2014-10-26 | Discharge: 2014-10-26 | Payer: BLUE CROSS/BLUE SHIELD | Attending: Internal Medicine | Primary: Internal Medicine

## 2014-10-26 DIAGNOSIS — F1010 Alcohol abuse, uncomplicated: Secondary | ICD-10-CM

## 2014-10-26 NOTE — Assessment & Plan Note (Signed)
Pre op exam and evaluation - risks versus benefits are acceptable for this patients above mentioned procedure for prostate Bx as per Dr Vicente Males

## 2014-10-26 NOTE — Assessment & Plan Note (Signed)
Stable continue current meds and return in 3 mo.

## 2014-10-26 NOTE — Telephone Encounter (Signed)
Per Dr. Jones Skene ok x 1 week off of work after procedure.    Spoke with patient and procedure is scheduled for 11/03/14, work note needs to be for 4/1 to 4/8.    Note will be placed at the front desk.

## 2014-10-26 NOTE — Progress Notes (Signed)
Subjective:      Patient ID: Phillip Harper is a 54 y.o. male.    HPI Comments: Here today for pre op H&P for the following procedure for prostate Bx as per Dr Vicente Males for elevated PSA       Current Outpatient Prescriptions on File Prior to Visit   Medication Sig Dispense Refill   ??? meloxicam (MOBIC) 7.5 MG tablet Take 1 tablet by mouth daily 90 tablet 1   ??? allopurinol (ZYLOPRIM) 300 MG tablet TAKE 1 TABLET BY MOUTH EVERY DAY 90 tablet 1   ??? amLODIPine (NORVASC) 5 MG tablet TAKE 1 TABLET BY MOUTH DAILY 90 tablet 1   ??? metoprolol (TOPROL-XL) 50 MG XL tablet TAKE 1 TABLET BY MOUTH EVERY EVENING 90 tablet 1   ??? hydrALAZINE (APRESOLINE) 25 MG tablet TAKE 1 TABLET BY MOUTH EVERY 8 HOURS 270 tablet 2   ??? simvastatin (ZOCOR) 40 MG tablet TAKE 1 TABLET BY MOUTH AT BEDTIME 90 tablet 1   ??? colchicine (COLCRYS) 0.6 MG tablet 1 po prn for gout attack may repeat in 2 hours no more than 2/24 hrs 10 tablet 1   ??? indomethacin (INDOCIN) 25 MG capsule Take 1 capsule by mouth 2 times daily (with meals). 60 capsule 2   ??? Vitamin D (CHOLECALCIFEROL) 1000 UNITS CAPS capsule Take 2,000 Units by mouth daily.     ??? metoprolol (TOPROL-XL) 50 MG XL tablet Take 1 tablet by mouth nightly. 90 tablet 2     No current facility-administered medications on file prior to visit.   Allergies  Review of patient's allergies indicates no known allergies.    Review of Systems   Constitutional: Negative.    HENT: Negative.    Eyes: Negative.         Glasses   Respiratory: Negative.         Sleep study was within normal limits no  c-pap needed    Cardiovascular: Negative.    Gastrointestinal: Negative.    Endocrine: Negative.    Genitourinary: Positive for urgency and frequency.        Nocturia - elevated PSA as noted cont with Dr Vicente Males    Musculoskeletal: Negative.         Gout is stable since taking allopurinol    Skin: Negative.    Allergic/Immunologic: Negative.    Neurological: Negative.    Hematological: Negative.    Psychiatric/Behavioral: Negative.       Filed Vitals:    10/26/14 1338   BP: 130/80   Pulse: 70   Resp: 14        Objective:   Physical Exam   Constitutional: He is oriented to person, place, and time. He appears well-developed and well-nourished.   HENT:   Head: Normocephalic and atraumatic.   Right Ear: External ear normal.   Left Ear: External ear normal.   Nose: Nose normal.   Mouth/Throat: Oropharynx is clear and moist.   Eyes: Conjunctivae and EOM are normal. Pupils are equal, round, and reactive to light.   Neck: Normal range of motion. Neck supple. No tracheal deviation present. No thyromegaly present.   Cardiovascular: Normal rate, regular rhythm, normal heart sounds and intact distal pulses.    No murmur heard.  Pulmonary/Chest: Effort normal and breath sounds normal.   Abdominal: Soft. Bowel sounds are normal.   obese   Genitourinary:   Per Dr Vicente Males    Musculoskeletal: Normal range of motion. Tenderness: low back    Neurological: He is alert  and oriented to person, place, and time. He has normal reflexes.   Skin: Skin is warm and dry.   Psychiatric: He has a normal mood and affect. His behavior is normal.       Assessment:              Plan:        Alcohol abuse  occ drinking trying to cut down       Elevated PSA  Pre op exam and evaluation - risks versus benefits are acceptable for this patients above mentioned procedure for prostate Bx as per Dr Vicente Males       Gout  Had a recent attack L 1st toe Tx with Indocin       HTN (hypertension)  Stable continue current meds and return in 3 mo.          Hyperlipidemia  Stable continue current meds and return in 3 mo.          Vitamin D deficiency  Continue current meds         Preop examination    Pre op exam and evaluation - risks versus benefits are acceptable for this patients above mentioned procedure for prostate Bx as per Dr Vicente Males

## 2014-10-26 NOTE — Assessment & Plan Note (Signed)
Had a recent attack L 1st toe Tx with Indocin

## 2014-10-26 NOTE — Communication Body (Signed)
Subjective:      Patient ID: Phillip Harper is a 54 y.o. male.    HPI Comments: Here today for pre op H&P for the following procedure for prostate Bx as per Dr Vicente Males for elevated PSA       Current Outpatient Prescriptions on File Prior to Visit   Medication Sig Dispense Refill   ??? meloxicam (MOBIC) 7.5 MG tablet Take 1 tablet by mouth daily 90 tablet 1   ??? allopurinol (ZYLOPRIM) 300 MG tablet TAKE 1 TABLET BY MOUTH EVERY DAY 90 tablet 1   ??? amLODIPine (NORVASC) 5 MG tablet TAKE 1 TABLET BY MOUTH DAILY 90 tablet 1   ??? metoprolol (TOPROL-XL) 50 MG XL tablet TAKE 1 TABLET BY MOUTH EVERY EVENING 90 tablet 1   ??? hydrALAZINE (APRESOLINE) 25 MG tablet TAKE 1 TABLET BY MOUTH EVERY 8 HOURS 270 tablet 2   ??? simvastatin (ZOCOR) 40 MG tablet TAKE 1 TABLET BY MOUTH AT BEDTIME 90 tablet 1   ??? colchicine (COLCRYS) 0.6 MG tablet 1 po prn for gout attack may repeat in 2 hours no more than 2/24 hrs 10 tablet 1   ??? indomethacin (INDOCIN) 25 MG capsule Take 1 capsule by mouth 2 times daily (with meals). 60 capsule 2   ??? Vitamin D (CHOLECALCIFEROL) 1000 UNITS CAPS capsule Take 2,000 Units by mouth daily.     ??? metoprolol (TOPROL-XL) 50 MG XL tablet Take 1 tablet by mouth nightly. 90 tablet 2     No current facility-administered medications on file prior to visit.   Allergies  Review of patient's allergies indicates no known allergies.    Review of Systems   Constitutional: Negative.    HENT: Negative.    Eyes: Negative.         Glasses   Respiratory: Negative.         Sleep study was within normal limits no  c-pap needed    Cardiovascular: Negative.    Gastrointestinal: Negative.    Endocrine: Negative.    Genitourinary: Positive for urgency and frequency.        Nocturia - elevated PSA as noted cont with Dr Vicente Males    Musculoskeletal: Negative.         Gout is stable since taking allopurinol    Skin: Negative.    Allergic/Immunologic: Negative.    Neurological: Negative.    Hematological: Negative.    Psychiatric/Behavioral: Negative.       Filed Vitals:    10/26/14 1338   BP: 130/80   Pulse: 70   Resp: 14        Objective:   Physical Exam   Constitutional: He is oriented to person, place, and time. He appears well-developed and well-nourished.   HENT:   Head: Normocephalic and atraumatic.   Right Ear: External ear normal.   Left Ear: External ear normal.   Nose: Nose normal.   Mouth/Throat: Oropharynx is clear and moist.   Eyes: Conjunctivae and EOM are normal. Pupils are equal, round, and reactive to light.   Neck: Normal range of motion. Neck supple. No tracheal deviation present. No thyromegaly present.   Cardiovascular: Normal rate, regular rhythm, normal heart sounds and intact distal pulses.    No murmur heard.  Pulmonary/Chest: Effort normal and breath sounds normal.   Abdominal: Soft. Bowel sounds are normal.   obese   Genitourinary:   Per Dr Vicente Males    Musculoskeletal: Normal range of motion. Tenderness: low back    Neurological: He is alert  and oriented to person, place, and time. He has normal reflexes.   Skin: Skin is warm and dry.   Psychiatric: He has a normal mood and affect. His behavior is normal.       Assessment:              Plan:        Alcohol abuse  occ drinking trying to cut down       Elevated PSA  Pre op exam and evaluation - risks versus benefits are acceptable for this patients above mentioned procedure for prostate Bx as per Dr Vicente Males       Gout  Had a recent attack L 1st toe Tx with Indocin       HTN (hypertension)  Stable continue current meds and return in 3 mo.          Hyperlipidemia  Stable continue current meds and return in 3 mo.          Vitamin D deficiency  Continue current meds         Preop examination    Pre op exam and evaluation - risks versus benefits are acceptable for this patients above mentioned procedure for prostate Bx as per Dr Vicente Males

## 2014-10-26 NOTE — Assessment & Plan Note (Signed)
Continue current meds

## 2014-10-26 NOTE — Telephone Encounter (Signed)
Patient calling regarding needing a work note post surgery because he feels that he may not beable to preform work duties. Patient said he has spoken with surgeons office and he feels that he was told its only out patient surgery. Patient would like to have a return call.

## 2014-10-26 NOTE — Assessment & Plan Note (Signed)
occ drinking trying to cut down

## 2014-12-02 ENCOUNTER — Encounter: Attending: Internal Medicine | Primary: Internal Medicine

## 2014-12-08 NOTE — Progress Notes (Signed)
[facility] [address] [place], [place] [address]    Acknowledgment of Informed Consent for Surgical or Medical Procedure and Sedation  I agree to allow doctor(s) PATRICK M WIRTZ and his/her associates or assistants, including residents and/or other qualified medical practitioner to perform the following medical treatment or procedure and to administer or direct the administration of sedation as necessary:  Procedure(s) : ROBOTIC ASSISTED LAPAROSCOPIC PROSTATECTOMY  My doctor has explained the following regarding the proposed procedure:  ??? the explanation of the procedure  ??? the benefits of the procedure  ??? the potential problems that might occur during recuperation  ??? the risks and side effects of the procedure which could include but are not limited to severe blood loss, infection, stroke or death  ??? the benefits, risks and side effect of alternative procedures including the consequences of declining this procedure or any alternative procedures  ??? the likelihood of achieving satisfactory results.  I acknowledge no guarantee or assurance has been made to me regarding the results.    I understand that during the course of this treatment/procedure, unforeseen conditions can occur which require an additional or different procedure.  I agree to allow my physician or assistants to perform such extension of the original procedure as they may find necessary.    I understand that sedation will often result in temporary impairment of memory and fine motor skills and that sedation can occasionally progress to a state of deep sedation or general anesthesia.    I understand the risks of anesthesia for surgery include, but are not limited to, sore throat, hoarseness, injury to face, mouth, or teeth; nausea; headache; injury to blood vessels or nerves; death, brain damage, or paralysis.    I understand that if I have a Limitation of Treatment order in effect during my hospitalization, the order  may or may not be in effect during this procedure.     I give my doctor permission to give me blood or blood products.  I understand that there are risks with receiving blood such as hepatitis, AIDS, fever, or allergic reaction.  I acknowledge that the risks, benefits, and alternatives of this treatment have been explained to me and that no express or implied warranty has been given by the hospital, any blood bank, or any person or entity as to the blood or blood components transfused.    At the discretion of my doctor, I agree to allow observers, equipment/product representatives and allow photographing, and/or televising of the procedure, provided my name or identity is maintained confidentially.      I agree the hospital may dispose of or use for scientific or educational purposes any tissue, fluid, or body parts which may be removed.    ________________________________Date________Time______ am/pm  ([facility])  Patient or Signature of Closest Relative or Legal Guardian    ________________________________Date________Time______am/pm      Page 1 of ____  Witness

## 2014-12-12 ENCOUNTER — Ambulatory Visit
Admit: 2014-12-12 | Discharge: 2014-12-12 | Payer: BLUE CROSS/BLUE SHIELD | Attending: Internal Medicine | Primary: Internal Medicine

## 2014-12-12 DIAGNOSIS — F1010 Alcohol abuse, uncomplicated: Secondary | ICD-10-CM

## 2014-12-12 NOTE — Assessment & Plan Note (Signed)
No recent symptoms cont meds

## 2014-12-12 NOTE — Communication Body (Signed)
Subjective:      Patient ID: Phillip Harper is a 54 y.o. male.    HPI Comments: Here today for pre op H&P for the following procedure  for laparoscopic prostatectomy 2nd to prostate Ca as per [name]    Current Outpatient Prescriptions on File Prior to Visit   Medication Sig Dispense Refill   ??? meloxicam (MOBIC) 7.5 MG tablet Take 1 tablet by mouth daily 90 tablet 1   ??? allopurinol (ZYLOPRIM) 300 MG tablet TAKE 1 TABLET BY MOUTH EVERY DAY 90 tablet 1   ??? amLODIPine (NORVASC) 5 MG tablet TAKE 1 TABLET BY MOUTH DAILY 90 tablet 1   ??? metoprolol (TOPROL-XL) 50 MG XL tablet TAKE 1 TABLET BY MOUTH EVERY EVENING 90 tablet 1   ??? hydrALAZINE (APRESOLINE) 25 MG tablet TAKE 1 TABLET BY MOUTH EVERY 8 HOURS 270 tablet 2   ??? simvastatin (ZOCOR) 40 MG tablet TAKE 1 TABLET BY MOUTH AT BEDTIME 90 tablet 1   ??? colchicine (COLCRYS) 0.6 MG tablet 1 po prn for gout attack may repeat in 2 hours no more than 2/24 hrs 10 tablet 1   ??? indomethacin (INDOCIN) 25 MG capsule Take 1 capsule by mouth 2 times daily (with meals). 60 capsule 2   ??? Vitamin D (CHOLECALCIFEROL) 1000 UNITS CAPS capsule Take 2,000 Units by mouth daily.     ??? metoprolol (TOPROL-XL) 50 MG XL tablet Take 1 tablet by mouth nightly. 90 tablet 2     No current facility-administered medications on file prior to visit.    Allergies  Review of patient's allergies indicates no known allergies.   Review of Systems   Constitutional: Negative.    HENT: Positive for congestion.    Eyes: Negative.         Glasses   Respiratory: Negative.         Sleep study was within normal limits no  c-pap needed    Cardiovascular: Negative.    Gastrointestinal: Negative.    Endocrine: Negative.    Genitourinary: Positive for urgency and frequency.        Nocturia -Prostate Ca as noted surgery as per [name] as planned    Musculoskeletal: Negative.         Gout is stable since taking allopurinol    Skin: Negative.    Allergic/Immunologic: Negative.    Neurological: Negative.    Hematological:  Negative.    Psychiatric/Behavioral: Negative.      Filed Vitals:    12/12/14 1358   BP: 124/74   Pulse: 78   Resp: 18      Objective:   Physical Exam   Constitutional: He is oriented to person, place, and time. He appears well-developed and well-nourished.   HENT:   Head: Normocephalic and atraumatic.   Nose: Nose normal.   Mouth/Throat: Oropharynx is clear and moist.   Eyes: Conjunctivae and EOM are normal. Pupils are equal, round, and reactive to light.   Neck: Normal range of motion. Neck supple. No tracheal deviation present. No thyromegaly present.   Cardiovascular: Normal rate, regular rhythm, normal heart sounds and intact distal pulses.    No murmur heard.  Pulmonary/Chest: Effort normal and breath sounds normal.   Abdominal: Soft. Bowel sounds are normal.   obese   Genitourinary:   Per [name]    Musculoskeletal: Normal range of motion. Tenderness: low back    Neurological: He is alert and oriented to person, place, and time. He has normal reflexes.  Skin: Skin is warm and dry.   Psychiatric: He has a normal mood and affect. His behavior is normal.       Assessment:              Plan:        Alcohol abuse  Under control only occ beer       Prostate CA ([place])  Pre op exam and evaluation - risks versus benefits are acceptable for this patients above mentioned procedure for laparoscopic prostatectomy 2nd to prostate Ca as per [name]      Hyperlipidemia  Stable continue current meds and return in 3 mo.          HTN (hypertension)  Stable continue current meds and return in 3 mo.          Gout  No recent symptoms cont meds       Preop examination  Pre op exam and evaluation - risks versus benefits are acceptable for this patients above mentioned procedure for laparoscopic prostatectomy 2nd to prostate Ca as per [name]      Vitamin D deficiency  Continue current meds

## 2014-12-12 NOTE — Assessment & Plan Note (Signed)
Continue current meds

## 2014-12-12 NOTE — Assessment & Plan Note (Signed)
Stable continue current meds and return in 3 mo.

## 2014-12-12 NOTE — Assessment & Plan Note (Signed)
Pre op exam and evaluation - risks versus benefits are acceptable for this patients above mentioned procedure for laparoscopic prostatectomy 2nd to prostate Ca as per [name]

## 2014-12-12 NOTE — Assessment & Plan Note (Signed)
Under control only occ beer

## 2014-12-12 NOTE — Progress Notes (Signed)
Subjective:      Patient ID: Phillip Harper is a 54 y.o. male.    HPI Comments: Here today for pre op H&P for the following procedure  for laparoscopic prostatectomy 2nd to prostate Ca as per [name]    Current Outpatient Prescriptions on File Prior to Visit   Medication Sig Dispense Refill   ??? meloxicam (MOBIC) 7.5 MG tablet Take 1 tablet by mouth daily 90 tablet 1   ??? allopurinol (ZYLOPRIM) 300 MG tablet TAKE 1 TABLET BY MOUTH EVERY DAY 90 tablet 1   ??? amLODIPine (NORVASC) 5 MG tablet TAKE 1 TABLET BY MOUTH DAILY 90 tablet 1   ??? metoprolol (TOPROL-XL) 50 MG XL tablet TAKE 1 TABLET BY MOUTH EVERY EVENING 90 tablet 1   ??? hydrALAZINE (APRESOLINE) 25 MG tablet TAKE 1 TABLET BY MOUTH EVERY 8 HOURS 270 tablet 2   ??? simvastatin (ZOCOR) 40 MG tablet TAKE 1 TABLET BY MOUTH AT BEDTIME 90 tablet 1   ??? colchicine (COLCRYS) 0.6 MG tablet 1 po prn for gout attack may repeat in 2 hours no more than 2/24 hrs 10 tablet 1   ??? indomethacin (INDOCIN) 25 MG capsule Take 1 capsule by mouth 2 times daily (with meals). 60 capsule 2   ??? Vitamin D (CHOLECALCIFEROL) 1000 UNITS CAPS capsule Take 2,000 Units by mouth daily.     ??? metoprolol (TOPROL-XL) 50 MG XL tablet Take 1 tablet by mouth nightly. 90 tablet 2     No current facility-administered medications on file prior to visit.    Allergies  Review of patient's allergies indicates no known allergies.   Review of Systems   Constitutional: Negative.    HENT: Positive for congestion.    Eyes: Negative.         Glasses   Respiratory: Negative.         Sleep study was within normal limits no  c-pap needed    Cardiovascular: Negative.    Gastrointestinal: Negative.    Endocrine: Negative.    Genitourinary: Positive for urgency and frequency.        Nocturia -Prostate Ca as noted surgery as per [name] as planned    Musculoskeletal: Negative.         Gout is stable since taking allopurinol    Skin: Negative.    Allergic/Immunologic: Negative.    Neurological: Negative.    Hematological:  Negative.    Psychiatric/Behavioral: Negative.      Filed Vitals:    12/12/14 1358   BP: 124/74   Pulse: 78   Resp: 18      Objective:   Physical Exam   Constitutional: He is oriented to person, place, and time. He appears well-developed and well-nourished.   HENT:   Head: Normocephalic and atraumatic.   Nose: Nose normal.   Mouth/Throat: Oropharynx is clear and moist.   Eyes: Conjunctivae and EOM are normal. Pupils are equal, round, and reactive to light.   Neck: Normal range of motion. Neck supple. No tracheal deviation present. No thyromegaly present.   Cardiovascular: Normal rate, regular rhythm, normal heart sounds and intact distal pulses.    No murmur heard.  Pulmonary/Chest: Effort normal and breath sounds normal.   Abdominal: Soft. Bowel sounds are normal.   obese   Genitourinary:   Per [name]    Musculoskeletal: Normal range of motion. Tenderness: low back    Neurological: He is alert and oriented to person, place, and time. He has normal reflexes.  Skin: Skin is warm and dry.   Psychiatric: He has a normal mood and affect. His behavior is normal.       Assessment:              Plan:        Alcohol abuse  Under control only occ beer       Prostate CA ([place])  Pre op exam and evaluation - risks versus benefits are acceptable for this patients above mentioned procedure for laparoscopic prostatectomy 2nd to prostate Ca as per [name]      Hyperlipidemia  Stable continue current meds and return in 3 mo.          HTN (hypertension)  Stable continue current meds and return in 3 mo.          Gout  No recent symptoms cont meds       Preop examination  Pre op exam and evaluation - risks versus benefits are acceptable for this patients above mentioned procedure for laparoscopic prostatectomy 2nd to prostate Ca as per [name]      Vitamin D deficiency  Continue current meds

## 2014-12-13 MED ORDER — METOPROLOL SUCCINATE ER 50 MG PO TB24
50 MG | ORAL_TABLET | ORAL | Status: DC
Start: 2014-12-13 — End: 2015-03-15

## 2014-12-14 ENCOUNTER — Inpatient Hospital Stay: Admit: 2014-12-14 | Payer: BLUE CROSS/BLUE SHIELD | Attending: Urology | Primary: Internal Medicine

## 2014-12-14 NOTE — Progress Notes (Signed)
The following educational items and goals will be achieved upon completion of the patient's Pre-admission testing appointment:             Identify the learner who is being assessed for education:  Patient                       Ability to Learn:  Exhibits ability to grasp concepts and respond to questions: High  Ready to Learn: Yes  anxious   Preferred Method of Learning:  written  Barriers to Learning: Verbalizes interest  Special Considerations due to cultural, religious, spiritual beliefs:  No  Language:  English  Language Interpreter:  No    [place]  [x]  Appropriate evaluation / integration of data as delineated by ASPAN Standards of Perianesthesia Nursing Practice    Pain scale and pain management   [x] Patient verbalizes understanding of pain scale and pain management  [x] Pre-operative determination of patient???s anticipated Post-Operative pain goal:   4 of 10 on 10 point scale post op goal  []  Other     Medication(s) - Compliance with preop medication instructions  [x]  Patient verbalizes understanding of preop medications (see [facility] Presurgical Instructions)    Instructions, Pre op                                                                                            [x]  Patient verbalizes understanding of presurgical instructions as reviewed with phone interview nurse or in-person nurse review    Fall Risk Potential, Preoperatively                                                                                   [x] No preoperative risk identified  [] Preop risk identified:                    [] Sensory deficit        [] Motor deficit        [] Balance problem        [] Home medication        [] Uses assistive device                    [] History of a Fall within the last 30 days    Goal(s) for fall prevention:  [] Prevent fall or injury by requesting assistance with activities of daily living  [] Patient / Significant other verbalizes understanding the need to call  for assistance prior to getting out of bed during hospitalization      Infection Precautions                                                                                            [  x] Patient understands implementation of Surgical Site Infection precautions (see [facility] Presurgical Instructions)     Patient Safety  [x]  Patient identification verified  [x]  Site verified    Instructions - Discharge Planning for Outpatients  []  Patient / significant other voices understanding of home care and follow up procedures  []  Encourage patient / significant other to review discharge instructions the day after procedure due to sedation on day of surgery    Anticipated Special Needs upon discharge:        []  Cooling device        []  Crutches       []  Walker        []  Wound Support device        []  Drain        []  Other       Instructions - Discharge Planning for Admitted patients  [x]  Patient / significant other understands plan for admission after surgery  [x]  Patient / significant other understands plan for anticipated discharge dispostion        12/14/2014 12:43 PM Alfredo Batty

## 2014-12-14 NOTE — Patient Instructions (Signed)
[place]  Date of Procedure 12/20/14 Time of Procedure 0730  PRIOR TO PROCEDURE DATE:  1. Arrange for someone to take you home and be with you after discharge since you cannot drive after receiving sedation. Please ensure it is someone we can share information with regarding your discharge.    2. Contact your doctor for advice if:       a. You are taking any blood thinners, aspirin, anti-inflammatory or vitamin E products.       b. There is a change in your physical state such as a cold, fever, rash, cuts, sores or infection, especially near your surgical site.    3. Do not drink alcohol the day before your surgery.    4. Please follow guidelines prior to surgery for diet, medication, or preparations as advised by your doctor.    5. FOLLOW INSTRUCTIONS FOR ARRIVAL TIME AS DIRECTED BY YOUR DOCTOR.  If your doctor does not give you a specific arrival time, please arrive at [place]:  1. On the day of surgery, enter the MAIN entrance from [facility] and follow the signs to the MAIN entrance and [place] parking.      2. Check in with the receptionist inside the MAIN lobby or follow the signs posted on the floor and walls to go directly into Same Day Surgery/Services located on the same floor as the lobby.  The phone number for SDS is [phone].    3. Bring your photo ID and your insurance card. Please call [phone] if you have not pre registered yet.     4. DO NOT EAT OR DRINK ANYTHING AFTER MIDNIGHT. The only exception would be a small sip of water with any medications you were told to take the morning of surgery.        a. Medication instructions for the day of surgery: TAKE APRESOLINE AND NORVASC W/ SIP OF WATER DOS       b. Please contact your Diabetic Physician to receive instructions regarding your insulin for the night before and the day of surgery.    5. Do not swallow water when brushing teeth. No gum, candy, mints or ice chips.  Refrain from smoking or at least decrease the amount.    6. Dress in loose, comfortable clothing appropriate for redressing after your procedure.  Do not wear jewelry, make-up-especially NO eye make-up, fingernail polish, lotion, powders or metal hairclips.    7. Dentures, glasses, or contacts will need to be removed before surgery. Bring cases for your glasses, contacts, dentures, or hearing aids.  If you use a CPAP, please bring it with you the day of surgery.    8. Leave any valuables at home such as credit cards, cash, cell phones and jewelry. The hospital will not be responsible for valuables that are not secured in the hospital safe.    9. You may bring a bag with personal items if you are to spend the night. Please have any large items you may need brought in by your family after your arrival to your hospital room.    10. Please bring a copy of any history and physical form, or medical reports your doctor may have given you.    11. If you have a Living Will or Durable Power of Attorney, please bring a copy on the day of your procedure.     HOW WE KEEP YOU SAFE and WORK TO PREVENT  SURGICAL SITE INFECTIONS:  1. Health care workers should always check your ID bracelet to verify your name and birth date. You will be asked many times to state your name, date of birth, and allergies.    2. Health care workers should always clean their hands with soap or alcohol gel before providing care to you. It is okay to ask anyone if they cleaned their hands before they touch you.    3. You will be actively involved in verifying the type of surgery you are having and ensuring the correct surgical site is confirmed.    4. Do not shave or wax for 72 hours prior to surgery near your operative site. Shaving with a razor can irritate your skin and make it easier to develop an infection. On the day of your procedure, any hair that needs to be removed near the surgical site will be ???clipped??? by a Dietitian using a special  clippers designed to avoid skin irritation.    5. When you are in the operating room, your surgical site will be cleansed with a special soap and in most cases you will be given an antibiotic before the surgery begins.    AFTER YOUR PROCEDURE:  1. For comfort and safety, arrange to have someone at home with you for the first 24 hours after discharge.    2. You and your family will be given written instructions about your diet, activity, dressing care, medications, and return visits.    3. Always clean your hands before and after caring for your wound.    4. Mild nausea, headache, muscle aches, sore throat, or fatigue may occur after anesthesia. Should any of these symptoms become severe, or should you notice any signs of infection, you should call your doctor.    5. Narcotic pain medications can cause significant constipation.  You may want to add a stool softener to your postoperative medication schedule or speak to your surgeon on how best to manage this side effect.    SPECIAL INSTRUCTIONS   ADDITIONAL INFORMATION REVIEWED:  No Bring a urine sample on day of surgery  Yes Taking Control of Your Pain  Yes FAQs about ???Surgical Site Infections  Yes Hibiclens?? Bathing Instructions or Other Antibacterial Soap (like Dial)  No Other     Thank you for allowing [place] to care for you.  We strive to exceed your expectations in the delivery of care and service to you and your family.    For further instructions or questions, please call [phone]    Information reviewed with patient during phone interview.   12/14/2014 .12:37 PM .Alfredo Batty

## 2014-12-14 NOTE — Progress Notes (Signed)
SURGERY SCHEDULING OFFICE NOTIFIED OF PT WT

## 2014-12-20 ENCOUNTER — Inpatient Hospital Stay
Admission: RE | Admit: 2014-12-20 | Discharge: 2014-12-22 | Disposition: A | Discharge status: Home / Self Care | Payer: BLUE CROSS/BLUE SHIELD | Attending: Urology | Admitting: Urology

## 2014-12-20 LAB — BASIC METABOLIC PANEL
Anion Gap: 14 (ref 3–16)
Anion Gap: 13 (ref 3–16)
BUN: 6 mg/dL — ABNORMAL LOW (ref 7–20)
BUN: 5 mg/dL — ABNORMAL LOW (ref 7–20)
CO2: 23 mmol/L (ref 21–32)
CO2: 24 mmol/L (ref 21–32)
Calcium: 9.1 mg/dL (ref 8.3–10.6)
Calcium: 8.5 mg/dL (ref 8.3–10.6)
Chloride: 101 mmol/L (ref 99–110)
Chloride: 102 mmol/L (ref 99–110)
Creatinine: 0.7 mg/dL — ABNORMAL LOW (ref 0.9–1.3)
Creatinine: 1.1 mg/dL (ref 0.9–1.3)
GFR African American: >60 (ref >60)
GFR African American: >60 (ref >60)
GFR Non-African American: >60 (ref >60)
GFR Non-African American: >60 (ref >60)
Glucose: 126 mg/dL — ABNORMAL HIGH (ref 70–99)
Glucose: 119 mg/dL — ABNORMAL HIGH (ref 70–99)
Potassium: 3.9 mmol/L (ref 3.5–5.1)
Potassium: 4.2 mmol/L (ref 3.5–5.1)
Sodium: 138 mmol/L (ref 136–145)
Sodium: 139 mmol/L (ref 136–145)

## 2014-12-20 LAB — CBC
Hematocrit: 42.6 % (ref 40.5–52.5)
Hemoglobin: 14 g/dL (ref 13.5–17.5)
MCH: 32.7 pg (ref 26.0–34.0)
MCHC: 33 g/dL (ref 31.0–36.0)
MCV: 99.4 fL (ref 80.0–100.0)
MPV: 7.8 fL (ref 5.0–10.5)
Platelets: 196 10*3/uL (ref 135–450)
RBC: 4.29 M/uL (ref 4.20–5.90)
RDW: 14.4 % (ref 12.4–15.4)
WBC: 6.5 10*3/uL (ref 4.0–11.0)

## 2014-12-20 LAB — TYPE AND SCREEN
ABO/Rh: AB POS
Antibody Screen: NEGATIVE

## 2014-12-20 LAB — HEMOGLOBIN AND HEMATOCRIT
Hematocrit: 42.4 % (ref 40.5–52.5)
Hemoglobin: 13.7 g/dL (ref 13.5–17.5)

## 2014-12-20 MED ORDER — ACETAMINOPHEN 325 MG PO TABS
325 MG | ORAL | Status: DC | PRN
Start: 2014-12-20 — End: 2014-12-22

## 2014-12-20 MED ORDER — ONDANSETRON HCL 4 MG/2ML IJ SOLN
4 MG/2ML | Freq: Four times a day (QID) | INTRAMUSCULAR | Status: DC | PRN
Start: 2014-12-20 — End: 2014-12-22

## 2014-12-20 MED ORDER — CIPROFLOXACIN IN D5W 400 MG/200ML IV SOLN
400 MG/200ML | INTRAVENOUS | Status: AC
Start: 2014-12-20 — End: ?

## 2014-12-20 MED ORDER — FENTANYL CITRATE (PF) 100 MCG/2ML IJ SOLN
100 MCG/2ML | INTRAMUSCULAR | Status: DC | PRN
Start: 2014-12-20 — End: 2014-12-20

## 2014-12-20 MED ORDER — ENOXAPARIN SODIUM 40 MG/0.4ML SC SOLN
40 MG/0.4ML | Freq: Every day | SUBCUTANEOUS | Status: DC
Start: 2014-12-20 — End: 2014-12-22
  Administered 2014-12-21 – 2014-12-22 (×2): 40 mg via SUBCUTANEOUS

## 2014-12-20 MED ORDER — FLOSEAL HEMOSTATIC MATRIX
Status: AC
Start: 2014-12-20 — End: ?

## 2014-12-20 MED ORDER — MORPHINE SULFATE (PF) 2 MG/ML IV SOLN
2 MG/ML | INTRAVENOUS | Status: DC | PRN
Start: 2014-12-20 — End: 2014-12-22
  Administered 2014-12-21: 02:00:00 2 mg via INTRAVENOUS

## 2014-12-20 MED ORDER — BELLADONNA ALKALOIDS-OPIUM 16.2-60 MG RE SUPP
RECTAL | Status: AC
Start: 2014-12-20 — End: ?

## 2014-12-20 MED ORDER — AMLODIPINE BESYLATE 5 MG PO TABS
5 MG | Freq: Every day | ORAL | Status: DC
Start: 2014-12-20 — End: 2014-12-22
  Administered 2014-12-21 – 2014-12-22 (×2): 5 mg via ORAL

## 2014-12-20 MED ORDER — SUCCINYLCHOLINE CHLORIDE 20 MG/ML IJ SOLN
20 MG/ML | INTRAMUSCULAR | Status: AC
Start: 2014-12-20 — End: ?

## 2014-12-20 MED ORDER — ONDANSETRON HCL 4 MG/2ML IJ SOLN
4 MG/2ML | INTRAMUSCULAR | Status: AC
Start: 2014-12-20 — End: ?

## 2014-12-20 MED ORDER — PROPOFOL 200 MG/20ML IV EMUL
200 MG/20ML | INTRAVENOUS | Status: AC
Start: 2014-12-20 — End: ?

## 2014-12-20 MED ORDER — MORPHINE SULFATE (PF) 4 MG/ML IV SOLN
4 MG/ML | INTRAVENOUS | Status: DC | PRN
Start: 2014-12-20 — End: 2014-12-22
  Administered 2014-12-22 (×2): 4 mg via INTRAVENOUS

## 2014-12-20 MED ORDER — NEOSTIGMINE METHYLSULFATE 1 MG/ML IJ SOLN
1 MG/ML | INTRAMUSCULAR | Status: AC
Start: 2014-12-20 — End: ?

## 2014-12-20 MED ORDER — ROCURONIUM BROMIDE 50 MG/5ML IV SOLN
50 MG/5ML | INTRAVENOUS | Status: AC
Start: 2014-12-20 — End: ?

## 2014-12-20 MED ORDER — COLCHICINE 0.6 MG PO TABS
0.6 MG | Freq: Every day | ORAL | Status: DC
Start: 2014-12-20 — End: 2014-12-22

## 2014-12-20 MED ORDER — FENTANYL CITRATE (PF) 250 MCG/5ML IJ SOLN
250 MCG/5ML | INTRAMUSCULAR | Status: AC
Start: 2014-12-20 — End: ?

## 2014-12-20 MED ORDER — BUPIVACAINE HCL (PF) 0.5 % IJ SOLN
0.5 % | INTRAMUSCULAR | Status: AC
Start: 2014-12-20 — End: ?

## 2014-12-20 MED ORDER — TRAMADOL HCL 50 MG PO TABS
50 MG | Freq: Four times a day (QID) | ORAL | Status: DC | PRN
Start: 2014-12-20 — End: 2014-12-22
  Administered 2014-12-21 – 2014-12-22 (×6): 50 mg via ORAL

## 2014-12-20 MED ORDER — DEXAMETHASONE SODIUM PHOSPHATE 4 MG/ML IJ SOLN
4 MG/ML | INTRAMUSCULAR | Status: AC
Start: 2014-12-20 — End: ?

## 2014-12-20 MED ORDER — FUROSEMIDE 10 MG/ML IJ SOLN
10 MG/ML | INTRAMUSCULAR | Status: AC
Start: 2014-12-20 — End: ?

## 2014-12-20 MED ORDER — FENTANYL CITRATE (PF) 100 MCG/2ML IJ SOLN
100 MCG/2ML | INTRAMUSCULAR | Status: AC
Start: 2014-12-20 — End: ?

## 2014-12-20 MED ORDER — LIDOCAINE HCL (CARDIAC) 20 MG/ML IV SOLN
20 MG/ML | INTRAVENOUS | Status: AC
Start: 2014-12-20 — End: ?

## 2014-12-20 MED ORDER — KETOROLAC TROMETHAMINE 15 MG/ML IJ SOLN
15 MG/ML | Freq: Four times a day (QID) | INTRAMUSCULAR | Status: DC | PRN
Start: 2014-12-20 — End: 2014-12-22
  Administered 2014-12-20 – 2014-12-22 (×3): 15 mg via INTRAVENOUS

## 2014-12-20 MED ORDER — CEFAZOLIN 2000 MG IN D5W 100 ML IVPB
Freq: Once | Status: AC
Start: 2014-12-20 — End: 2014-12-20
  Administered 2014-12-20: 12:00:00 2 g via INTRAVENOUS

## 2014-12-20 MED ORDER — HYDROMORPHONE HCL 2 MG/ML IJ SOLN
2 MG/ML | INTRAMUSCULAR | Status: AC
Start: 2014-12-20 — End: ?

## 2014-12-20 MED ORDER — PHENYLEPHRINE HCL 10 MG/ML IJ SOLN
10 MG/ML | INTRAMUSCULAR | Status: AC
Start: 2014-12-20 — End: ?

## 2014-12-20 MED ORDER — LABETALOL HCL 5 MG/ML IV SOLN
5 MG/ML | INTRAVENOUS | Status: DC | PRN
Start: 2014-12-20 — End: 2014-12-20

## 2014-12-20 MED ORDER — GLYCOPYRROLATE 0.4 MG/2ML IJ SOLN
0.4 MG/2ML | INTRAMUSCULAR | Status: AC
Start: 2014-12-20 — End: ?

## 2014-12-20 MED ORDER — LACTATED RINGERS IV SOLN
INTRAVENOUS | Status: DC
Start: 2014-12-20 — End: 2014-12-20

## 2014-12-20 MED ORDER — LACTATED RINGERS IV SOLN
INTRAVENOUS | Status: DC
Start: 2014-12-20 — End: 2014-12-20
  Administered 2014-12-20: 11:00:00 via INTRAVENOUS

## 2014-12-20 MED ORDER — DIPHENHYDRAMINE HCL 50 MG/ML IJ SOLN
50 MG/ML | INTRAMUSCULAR | Status: AC
Start: 2014-12-20 — End: ?

## 2014-12-20 MED ORDER — HYDROMORPHONE HCL 1 MG/ML IJ SOLN
1 MG/ML | INTRAMUSCULAR | Status: DC | PRN
Start: 2014-12-20 — End: 2014-12-20
  Administered 2014-12-20 (×3): 0.5 mg via INTRAVENOUS

## 2014-12-20 MED ORDER — ALLOPURINOL 300 MG PO TABS
300 MG | Freq: Every day | ORAL | Status: DC
Start: 2014-12-20 — End: 2014-12-22
  Administered 2014-12-21 – 2014-12-22 (×3): 300 mg via ORAL

## 2014-12-20 MED ORDER — NORMAL SALINE FLUSH 0.9 % IV SOLN
0.9 % | INTRAVENOUS | Status: DC | PRN
Start: 2014-12-20 — End: 2014-12-22

## 2014-12-20 MED ORDER — OXIDIZED CELLULOSE EX PADS
CUTANEOUS | Status: AC
Start: 2014-12-20 — End: ?

## 2014-12-20 MED ORDER — NORMAL SALINE FLUSH 0.9 % IV SOLN
0.9 % | Freq: Two times a day (BID) | INTRAVENOUS | Status: DC
Start: 2014-12-20 — End: 2014-12-22
  Administered 2014-12-21 – 2014-12-22 (×2): 10 mL via INTRAVENOUS

## 2014-12-20 MED ORDER — ONDANSETRON HCL 4 MG/2ML IJ SOLN
4 MG/2ML | Freq: Once | INTRAMUSCULAR | Status: DC | PRN
Start: 2014-12-20 — End: 2014-12-20

## 2014-12-20 MED ORDER — HYDROMORPHONE HCL 1 MG/ML IJ SOLN
1 MG/ML | INTRAMUSCULAR | Status: AC | PRN
Start: 2014-12-20 — End: 2014-12-20
  Administered 2014-12-20 (×4): 0.25 mg via INTRAVENOUS

## 2014-12-20 MED ORDER — HYDRALAZINE HCL 25 MG PO TABS
25 MG | Freq: Three times a day (TID) | ORAL | Status: DC
Start: 2014-12-20 — End: 2014-12-22
  Administered 2014-12-21 – 2014-12-22 (×5): 25 mg via ORAL

## 2014-12-20 MED ORDER — MIDAZOLAM HCL 2 MG/2ML IJ SOLN
2 MG/ML | INTRAMUSCULAR | Status: AC
Start: 2014-12-20 — End: ?

## 2014-12-20 MED ORDER — FAMOTIDINE 20 MG/2ML IV SOLN
20 MG/2ML | Freq: Two times a day (BID) | INTRAVENOUS | Status: DC
Start: 2014-12-20 — End: 2014-12-22
  Administered 2014-12-21 – 2014-12-22 (×4): 20 mg via INTRAVENOUS

## 2014-12-20 MED ORDER — KCL IN DEXTROSE-NACL 20-5-0.45 MEQ/L-%-% IV SOLN
INTRAVENOUS | Status: DC
Start: 2014-12-20 — End: 2014-12-22
  Administered 2014-12-20 – 2014-12-22 (×4): via INTRAVENOUS

## 2014-12-20 MED ORDER — MAGNESIUM HYDROXIDE 400 MG/5ML PO SUSP
400 MG/5ML | Freq: Every day | ORAL | Status: DC | PRN
Start: 2014-12-20 — End: 2014-12-22

## 2014-12-20 MED ORDER — SODIUM CHLORIDE 0.9 % IV SOLN (MINI-BAG)
0.9 % | Freq: Four times a day (QID) | INTRAVENOUS | Status: DC
Start: 2014-12-20 — End: 2014-12-22
  Administered 2014-12-21 – 2014-12-22 (×7): 1.5 g via INTRAVENOUS

## 2014-12-20 MED ORDER — PROMETHAZINE HCL 25 MG/ML IJ SOLN
25 MG/ML | Freq: Once | INTRAMUSCULAR | Status: DC | PRN
Start: 2014-12-20 — End: 2014-12-20

## 2014-12-20 MED ORDER — METOPROLOL SUCCINATE ER 50 MG PO TB24
50 MG | Freq: Every evening | ORAL | Status: DC
Start: 2014-12-20 — End: 2014-12-22
  Administered 2014-12-21 – 2014-12-22 (×2): 50 mg via ORAL

## 2014-12-20 MED FILL — PROPOFOL 200 MG/20ML IV EMUL: 200 MG/20ML | INTRAVENOUS | Qty: 40

## 2014-12-20 MED FILL — PHENYLEPHRINE HCL 10 MG/ML IJ SOLN: 10 MG/ML | INTRAMUSCULAR | Qty: 1

## 2014-12-20 MED FILL — KETOROLAC TROMETHAMINE 15 MG/ML IJ SOLN: 15 MG/ML | INTRAMUSCULAR | Qty: 1

## 2014-12-20 MED FILL — GLYCOPYRROLATE 0.4 MG/2ML IJ SOLN: 0.4 MG/2ML | INTRAMUSCULAR | Qty: 4

## 2014-12-20 MED FILL — MIDAZOLAM HCL 2 MG/2ML IJ SOLN: 2 MG/ML | INTRAMUSCULAR | Qty: 2

## 2014-12-20 MED FILL — ZEMURON 50 MG/5ML IV SOLN: 50 MG/5ML | INTRAVENOUS | Qty: 15

## 2014-12-20 MED FILL — NEOSTIGMINE METHYLSULFATE 1 MG/ML IJ SOLN: 1 MG/ML | INTRAMUSCULAR | Qty: 10

## 2014-12-20 MED FILL — ANECTINE 20 MG/ML IJ SOLN: 20 MG/ML | INTRAMUSCULAR | Qty: 10

## 2014-12-20 MED FILL — MARCAINE PRESERVATIVE FREE 0.5 % IJ SOLN: 0.5 % | INTRAMUSCULAR | Qty: 30

## 2014-12-20 MED FILL — ZEMURON 50 MG/5ML IV SOLN: 50 MG/5ML | INTRAVENOUS | Qty: 10

## 2014-12-20 MED FILL — ONDANSETRON HCL 4 MG/2ML IJ SOLN: 4 MG/2ML | INTRAMUSCULAR | Qty: 2

## 2014-12-20 MED FILL — LIDOCAINE HCL (CARDIAC) 20 MG/ML IV SOLN: 20 MG/ML | INTRAVENOUS | Qty: 5

## 2014-12-20 MED FILL — HYDROMORPHONE HCL 1 MG/ML IJ SOLN: 1 MG/ML | INTRAMUSCULAR | Qty: 1

## 2014-12-20 MED FILL — DIPHENHYDRAMINE HCL 50 MG/ML IJ SOLN: 50 MG/ML | INTRAMUSCULAR | Qty: 1

## 2014-12-20 MED FILL — AMPICILLIN-SULBACTAM SODIUM 1.5 (1-0.5) G IJ SOLR: 1.5 (1-0.5) g | INTRAMUSCULAR | Qty: 1.5

## 2014-12-20 MED FILL — FENTANYL CITRATE (PF) 250 MCG/5ML IJ SOLN: 250 MCG/5ML | INTRAMUSCULAR | Qty: 5

## 2014-12-20 MED FILL — DEXAMETHASONE SODIUM PHOSPHATE 4 MG/ML IJ SOLN: 4 MG/ML | INTRAMUSCULAR | Qty: 2

## 2014-12-20 MED FILL — CEFAZOLIN 2000 MG IN D5W 100 ML IVPB: Qty: 2

## 2014-12-20 MED FILL — FLOSEAL HEMOSTATIC MATRIX: Qty: 10

## 2014-12-20 MED FILL — SURGICEL EX PADS: CUTANEOUS | Qty: 2

## 2014-12-20 MED FILL — CIPROFLOXACIN IN D5W 400 MG/200ML IV SOLN: 400 MG/200ML | INTRAVENOUS | Qty: 200

## 2014-12-20 MED FILL — HYDROMORPHONE HCL 2 MG/ML IJ SOLN: 2 MG/ML | INTRAMUSCULAR | Qty: 1

## 2014-12-20 MED FILL — BELLADONNA ALKALOIDS-OPIUM 16.2-60 MG RE SUPP: RECTAL | Qty: 1

## 2014-12-20 MED FILL — ZEMURON 50 MG/5ML IV SOLN: 50 MG/5ML | INTRAVENOUS | Qty: 5

## 2014-12-20 MED FILL — FENTANYL CITRATE (PF) 100 MCG/2ML IJ SOLN: 100 MCG/2ML | INTRAMUSCULAR | Qty: 2

## 2014-12-20 MED FILL — KCL IN DEXTROSE-NACL 20-5-0.45 MEQ/L-%-% IV SOLN: INTRAVENOUS | Qty: 1000

## 2014-12-20 MED FILL — FUROSEMIDE 10 MG/ML IJ SOLN: 10 MG/ML | INTRAMUSCULAR | Qty: 10

## 2014-12-20 MED FILL — DEXAMETHASONE SODIUM PHOSPHATE 4 MG/ML IJ SOLN: 4 MG/ML | INTRAMUSCULAR | Qty: 1

## 2014-12-20 NOTE — H&P (Signed)
SDS History & Physical UPDATE:                                       (  It has been less than 30 days since last complete / full H & P )      Phillip Harper    Diagnosis: Prostate Cancer-   - " Non-smoker-    Temp 99.3 - ?? 2-3 centimeters Abscess @ LEFT lateral posterior neck.    HPI / INDICATION / Manifestations: Patient and wife described elevated PSA levels and a recent prostate biopsy.    Diagnosis # 2. Morbid OBESITY-     Diagnosis # 3. HTN-    #4. Hx Alcoholic Pancreatitis-     #5- Hx Keloid surgery - posterior neck / soft-tissues.       Patient Active Problem List   Diagnosis   ??? Gout   ??? Hyperlipidemia   ??? HTN (hypertension)   ??? Vitamin D deficiency   ??? Alcohol abuse   ??? Well adult exam   ??? Prostate CA ([place])   ??? Preop examination       Past Medical History:      Diagnosis Date   ??? Gout    ??? Hyperlipidemia    ??? HTN (hypertension)    ??? Keloids 12/2003     multiple keloids on head   ??? Vitamin D deficiency    ??? Clostridium difficile infection 08/01/2010   ??? Pancreatitis, alcoholic 53/66/44-03/47/42   ??? Cancer [facility]) 2016     Prostate Dr Marlene Lard       Medication List:  Prescriptions prior to admission: metoprolol (TOPROL-XL) 50 MG XL tablet, TAKE 1 TABLET BY MOUTH EVERY EVENING  meloxicam (MOBIC) 7.5 MG tablet, Take 1 tablet by mouth daily  allopurinol (ZYLOPRIM) 300 MG tablet, TAKE 1 TABLET BY MOUTH EVERY DAY  amLODIPine (NORVASC) 5 MG tablet, TAKE 1 TABLET BY MOUTH DAILY  hydrALAZINE (APRESOLINE) 25 MG tablet, TAKE 1 TABLET BY MOUTH EVERY 8 HOURS  simvastatin (ZOCOR) 40 MG tablet, TAKE 1 TABLET BY MOUTH AT BEDTIME  colchicine (COLCRYS) 0.6 MG tablet, 1 po prn for gout attack may repeat in 2 hours no more than 2/24 hrs  Vitamin D (CHOLECALCIFEROL) 1000 UNITS CAPS capsule, Take 2,000 Units by mouth daily.  indomethacin (INDOCIN) 25 MG capsule, Take 1 capsule by mouth 2 times daily (with meals). (Patient taking differently: Take 25 mg by mouth 2 times daily as needed )  metoprolol (TOPROL-XL) 50 MG XL tablet, Take  1 tablet by mouth nightly.      Current facility-administered medications:   ???  lactated ringers infusion, , Intravenous, Continuous, Sherrilee Gilles, DO  ???  ceFAZolin (ANCEF) 2 g in dextrose 5% 100 mL IVPB, 2 g, Intravenous, Once, [name], MD  ???  lactated ringers infusion, , Intravenous, Continuous, Carolee Rota III, MD    Allergies:  Review of patient's allergies indicates no known allergies.    Lab Results   Component Value Date    NA 144 10/18/2014    K 4.8 10/18/2014    CL 103 10/18/2014    CO2 25 10/18/2014    BUN 8 10/18/2014    CREATININE 0.8 10/18/2014    CREATININE 0.8 07/26/2010    GLUCOSE 115 10/18/2014    GLUCOSE 89 12/06/2009    CALCIUM 9.0 10/18/2014       Lab Results  Component Value Date    POCGLU 169 07/26/2010       Lab Results   Component Value Date    WBC 5.1 10/18/2014    WBC 4.8 12/06/2009    RBC 4.26 10/18/2014    HGB 14.2 10/18/2014    HCT 42.7 10/18/2014    MCV 100.1 10/18/2014    MCH 33.3 10/18/2014    MCHC 33.2 10/18/2014    RDW 15.6 10/18/2014    PLT 203 10/18/2014       @LABRCNT  (pro:3inr:3)@    @LABRCNT  (aptt:3)@    GENERAL: Alert and aware of  Today's planned surgical  Procedure ( s ).     NECK: Supple, midline trach, No  Auscultated bruits, no LA.         LUNGS:  CTA , BL  / No  Wheezing or Ronchi           CV: RRR,  No M / G / H.             ABDOMEN: No appliances, + BS, ND / NT, today.    SKIN: W / D, Intact, no rashes today. Do you have  Pressure ulcers / Decubiti  ( Denies  ) - Location  ( NA ).    EXTREMITIES:  Symmetrical, no acute changes.    NEUROLOGIC:  Grossly intact- clear speech, appropriate responses to Questions, pupils round and =  and there are = BL upper and lower extremity  Movements are  intact, / central and distal sensory perceptions  remains grossly intact.    BP 166/93 mmHg   Pulse 90   Temp(Src) 99.3 ??F (37.4 ??C) (Oral)   Resp 18   Ht 5\' 9"  (1.753 m)   Wt 264 lb (119.75 kg)   BMI 38.97 kg/m2   SpO2 97%         Assessment: This is a  54  y.o., Morbidly OBESE, Hypertensive  male, non-smoker ( " I never smoked ")  with documented PROSTATE Cancer.    Temp 99.3 in SDS, today.    Plan: ROBOTIC assisted Laparoscopic PROSTATECTOMY -today.    Rehabilitation as directed by surgeon / therapist- COMMENT:  Continue health care monitoring / maintenance with PCP- including,  ( wt loss and BP mgmt   ) - Advised.    Consultations: Medicine, OT / PT / Social Services / RT and other services are available and will be requested on a PRN basis - as needed.    I have reviewed the patient history and physical; I have examined the patient and find no relevant changes or I have indicated changes that I found during my review or exam.      Additionally, the  existing  / original H & P  has been requested, reviewed or otherwise discussed with this patient - family or guardian, by me and there are no new clinical changes.     Matilde Sprang, P.A.-C.

## 2014-12-20 NOTE — Plan of Care (Signed)
Problem: Falls - Risk of  Goal: Absence of falls  Outcome: Met This Shift  Patient free of falls/injury during duration of shift. Patient assessed as a medium fall risk. Bed in lowest position, wheels locked, call light w/in reach, 2/4 side rails up, bed alarm on, fall risk visual posted outside door & fall risk ID on. Patient instructed to call for assistance prior to getting out of bed. Will continue to monitor.

## 2014-12-20 NOTE — Progress Notes (Signed)
Clinical discharge- patient waiting on room to be available. Family at bedside. VS stable.

## 2014-12-20 NOTE — Brief Op Note (Addendum)
Brief Postoperative Note    Phillip Harper  Date of Birth:  [birth year]  [id number]    Pre-operative Diagnosis: prostate cancer    Post-operative Diagnosis: Same    Procedure: robotic prostatectomy, b plnd    Anesthesia: General    Surgeons/Assistants: Dabney Schanz      DIFFICULT PROSTATECTOMY WITH DIFFICULT ANASTOMOSIS.    DRAIN LEFT IN PLACE.    WILL LEAVE FOLEY IN PLACE FOR 7-10 DAYS AND DO CYSTOGRAM AS OUTPATIENT  DRAIN CAN BE REMOVED IF JP CR IS NORMAL. [name].  LABS ORDERED IN PACU    Electronically signed by [name], MD on 12/20/2014 at 9:00 AM

## 2014-12-20 NOTE — Progress Notes (Signed)
Drain drsg saturated uner tape. Drain holding sx. Drsg reinforced with gauze and medipore. Gown changed

## 2014-12-20 NOTE — Other (Addendum)
Patient Acct Nbr:  [id number]  Primary AUTH/CERT:    [place] Name:   Phillip Harper ACCESS/FEDERAL (R)  Primary Insurance Plan Name:  Phillip Harper ACCESS/FEDERAL(R)  Primary Insurance Group Number:  [id number]  Primary Insurance Plan Type: B  Primary Insurance Policy Number:  [id number]

## 2014-12-20 NOTE — Progress Notes (Signed)
Ancef 2 gr iv to or

## 2014-12-20 NOTE — Progress Notes (Signed)
Dr. Quincy Simmonds here to assess pt. Remains without distress.Family at bedside.

## 2014-12-20 NOTE — Progress Notes (Signed)
Placed in clinical discharge. VS changed to every hour

## 2014-12-20 NOTE — Anesthesia Post-Procedure Evaluation (Signed)
Anesthesia Post-op Note    Patient: Phillip Harper    Procedure(s) Performed: Robotic prostatectomy, bilateral pelvic lymph node dissection.     DOS : 5.17.16    Surgeon : P. Lorenz Coaster     Anesthesia type: general    Post-op assessment:  Anesthetic Problems: no   Last Vitals:    Filed Vitals:    12/20/14 1500   BP: 149/64   Pulse: 86   Temp:    Resp: 16   SpO2: 97%     Cardiovascular System Stable: yes  Respiratory Function: Airway Patent yes  ETT no  Ventilator no  Level of consciousness: awake, alert and oriented  Post-op pain: adequate analgesia  Hydration Adequate: yes  Nausea/Vomiting:no  Other Issues:     Ardeth Sportsman, MD

## 2014-12-20 NOTE — Progress Notes (Signed)
Patient no longer mouth breathing. Swelling to face and eyes decreasing

## 2014-12-20 NOTE — Progress Notes (Signed)
PACU Transfer Note    Filed Vitals:    12/20/14 1800   BP: 134/79   Pulse: 77   Temp:    Resp:    SpO2: 95%       In: 785 [P.O.:400; I.V.:385]  Out: 275 [Urine:250; Drains:25]    Pain assessment:  Bedside report given to Margaretha Sheffield RN at bedside. VS stable. Pain level 3-4 tolerable. Skin check completed. Patient to transfer to room #5308 via bed as scheduled.  Pain Level: 5    Report given to Receiving unit RN.    12/20/2014 6:56 PM

## 2014-12-20 NOTE — Progress Notes (Signed)
Patient admitted to 5th floor from pacu post robotic prostatectomy procedure @ 1905. VSS. Pt reported pain level of a 4 on a scale of 1-10 located in right lower and mid abdominal area. Pt states pain level is tolerable. Lap sites x5 w/ surgical glue, CDI. No reports of nausea/ vomiting. Tolerating clear liquids. Admission documentation complete & pt oriented to room & use of call light. Will continue to monitor.

## 2014-12-20 NOTE — Progress Notes (Addendum)
Patient speaks as if mouth partially full. Back of throat extremely swollen but not having any troubles breathing. Maintaining SAo2 in high 90's. Dr. Lorenz Coaster paged [facility]) and made aware

## 2014-12-20 NOTE — Op Note (Signed)
PATIENT NAME:                 PA #:            MR #Phillip Harper, Phillip Harper                [id number]       [id number]            SURGEON:                              SURG DATE:  DIS DATE:          [name], MD             12/20/2014                      PRIMARY CARE PHYSICIAN:              REFERRING PHYSICIAN:            DATE OF BIRTH:   AGE:           PATIENT TYPE:     RM #:              Mar 05, 1961       53             IPJ               JSU                   PREOPERATIVE DIAGNOSIS(ES):  Prostate cancer.       POSTOPERATIVE DIAGNOSIS(ES):  Prostate cancer.       PROCEDURE(S) PERFORMED:  Robotic prostatectomy, bilateral pelvic lymph node  dissection.       SURGEON:  Forestine Na, MD      DRAINS:  Keenan Bachelor drain and Foley catheter.       ESTIMATED BLOOD LOSS:  450 mL.       ANESTHESIA:  General endotracheal.       INDICATIONS FOR PROCEDURE:  This patient is a pleasant 54 year old gentleman.   He has no history of abdominal operations and drives a semi for a living.   His father died of CVD at age 67.  His PSA is 5.42 with Gleason score of  3+4=7 on the right far lateral and a Gleason 6 score in the left apex.  He  had 312 cores positive with perineural invasion.  His PSA density is 0.19  with a prostate volume of 28.4.  CT scan and bone scan were omitted.  IPSS  score is 13 out of 35 and SHIM 25 out of 25.  He is clinical stage 1C and  intermediate risk disease.  We have discussed with him in detail including  active surveillance, radiation modalities and prostatectomy.  He elects for a  prostatectomy.       DETAILS OF PROCEDURE:  After informed consent, the patient was taken to the  operating room.  He was prepped and draped in sterile fashion and given a  dose of preoperative antibiotics.  I should also state that the patient had  minor fevers in the preoperative area with a temperature of 99.3.  He has a  large keloid on his back that has had plastic surgical repair in the past.   There was  some purulent material  coming from this.  I was able to express  this while he was awake, but felt there was no phlegmon to surgically remove.   Therefore, I did elect to go ahead and proceed with the robotic surgery.       After stab incision was made at the umbilicus, he was insufflated 15 mmHg.  I  put my standard robotic trocars in.  This involved a camera port, a 12 mm  assistant port in the right far lateral and then 3 robotics arms.  He was  then placed in steep Trendelenburg.  Upon looking in with the camera, I knew  that we were going to be in a little bit of a difficult case.  He had  multiple unfavorable factors for surgery including his obesity, a narrow  pelvis and toxic fat surrounding the prostate.  These issues caused the case  to run approximately 40% over its allotted time.  At any rate, I attempted to  reflect the colon, but he had a large amount of redundancy.  Therefore, some  of the colon had to remain in the pelvis.  We also swept off some of the  small bowel, but this was able to be done successfully.       I then took an anterior approach.  I incised lateral to the median lobe and  took the bladder off anterior abdominal wall and developed the space of  Retzius.  Again, he had very poor anatomy.  We were able to suck some of the  fat off the endopelvic fascia but not much.  I basically had to Bovie through  the fat to find the endopelvis.  I was able to incise the endopelvis on the  right side without much difficulty.  The left was a little bit more  difficult, so I left this for the time being.  I then attempted to take an  anterior approach.  His anatomy was such that the fat layer on top of the  bladder and prostate was toxic, and the anatomy was not well delineated.  I  therefore came back proximally and attempted to do an anterior cystotomy.   Once I did this, I realized that he had a very small gland, and the prostate  was much more caudally located.  Therefore, I repaired the 2 layers  with 3-0  chromic and 3-0 Vicryl.       Once the anatomy was better delineated, I made a further incision another  couple centimeters down from this.  This allowed me to Bovie right at the  bladder and prostate junction.  I then made my posterior cystotomy as well,  as the catheter was drawn back.  I then took the third arm and attempted to  cut through the posterior wall.  This was done successfully, but again there  just was not much room in there.  I was able to get down to the seminal  vesicles, got around both vasa deferentia bilaterally.  These were divided  with the PK.  This set up my pedicles nicely, and I did use Weck clips when  possible.  The right nerves were actually easy to spare than the left, even  though the worst cancer was on the right side.  I continued to sweep back and  forth and developed the nerve packets on both sides.  Again, the left far  lateral nerve packet did not develop very well.  At any rate, I should also  mention that  he was oozing quite considerably from multiple large blood  vessels.  It turns out the patient does have, probably, some undiagnosed  liver disease as he is a heavy drinker.  At any rate, I then cut through the  puboprostatic ligament and took down the DVC.  We did lose a little bit of  blood during the DVC but then we turned the pressure up to 20, and I  oversewed this with 0 Vicryl and then bleeding was minimized.  I then made my  anterior and posterior cuts.  I went as far apically as possible but, again,  the anatomy was quite confusing due to the fused fat to the gland.  In the  end, I was quite satisfied, but I did Bovie some of the apical margin.  This  may affect our margin status when I get the report back.  At any rate, both  the anterior cuts were made, and then the rest of the prostate was swept off  the rectum and placed in the cul-de-sac.  We then copiously irrigated.  I  then wanted to perform the anastomosis but still was not pleased with  the  anatomy.  I wanted to be able to see the ureteral orifices.  Therefore, I  gave the patient a dose of Lasix.  I then turned my attention to the lymph  node dissection to give time for the Lasix to work.       I then did a standard lymph node dissection.  He did have some mildly  suspicious lymph nodes but nothing overt.  I incised lateral to the  genitofemoral nerve and swept this off.  I then use the split-and-roll  maneuver off the left iliac artery as well as the iliac vein.  I then  identified [place] node and tried to ligate this distally.  The proximal  aspect of lymph node dissection was also ligated with the PK.  The obturator  nerve was identified and spared.  The lymph node packet was then placed with  the prostate.  The exact same lymph node dissection was done on the right  side.  We then introduced the EndoCatch bag and placed all the contents into  the bag.       We then turned our attention to performing a quite difficult anastomosis.   This is one of the tougher ones I have done.  I did it in a standard fashion  by going outside-in on the bladder then inside-out on the urethra.  His  pelvis was so narrow, and he had a very prominent pubic symphysis which made  visualization quite difficult.  Using peritoneal pressure, I was able to get  the back wall down.  I then cinched this V-lock stitch down.  I then took my  other V-lock, went outside in on the urethra then inside-out on the bladder.   I did see some bladder mucosa when the anastomosis came down on his right  lateral side, and so I knew that we would likely have a leak.  I debated the  pros and cons of trying to throw some random stitches into this area but  elected not to as it did come down nicely, and I do expect this to heal.   Then 15 mL was placed in the bladder and then I placed approximately 112 mL  of saline into the bladder.  Again, there was a small leak on his right side.   Therefore, we copiously irrigated one  last time and  placed some FloSeal into  the lymph node dissection bed as well as the pedicles on the right and left  sides.  The specimen was then removed from midline incision, and this was  oversewn with an interrupted Nurolon stitch.  The incisions were then  copiously irrigated and anesthetized and then closed with 4-0 Monocryl.   Patient tolerated the procedure well.       I should also note that we did see quite a bit of copious urine coming out of  the Foley catheter, so I am confidant that the ureters were not injured  during this case.  This was an extremely difficult case mostly due to the  anatomy of the patient.  I am going to check a CBC in the recovery room as it  has been just made aware to me by the family that the patient does have a  significant drinking history and may be an alcoholic.  We will have to follow  this closely while he is in the hospital here.                                              [name], MD     [id number]  DD: 12/20/2014 14:44  DT: 12/20/2014 15:39  Job #: [id number]  CC: Weyman Rodney, MD  CC: JEFFREY Leandra Kern, MD  CC: [name], MD

## 2014-12-20 NOTE — Anesthesia Pre-Procedure Evaluation (Addendum)
ANESTHESIA PRE-OP NOTE    NAME: Phillip Harper  DOB: [birth year]  AGE: 54 y.o.  MED. REC. #: [id number]    DOS: 5.17.16     PROCEDURE: ROBOTIC ASSISTED LAPAROSCOPIC PROSTATECTOMY       SURGEON: Lorenz Coaster    ALLERGIES: Review of patient's allergies indicates no known allergies. Height: 5\' 9"  (175.3 cm) Weight: 264 lb (119.75 kg)  Filed Vitals:    12/20/14 0609   BP: 166/93   Pulse: 90   Temp: 99.3 ??F (37.4 ??C)   Resp: 18   SpO2: 97%      MEDICATIONS:  Current Discharge Medication List      CONTINUE these medications which have NOT CHANGED    Details   metoprolol (TOPROL-XL) 50 MG XL tablet TAKE 1 TABLET BY MOUTH EVERY EVENING  Qty: 90 tablet, Refills: 1      meloxicam (MOBIC) 7.5 MG tablet Take 1 tablet by mouth daily  Qty: 90 tablet, Refills: 1      allopurinol (ZYLOPRIM) 300 MG tablet TAKE 1 TABLET BY MOUTH EVERY DAY  Qty: 90 tablet, Refills: 1      amLODIPine (NORVASC) 5 MG tablet TAKE 1 TABLET BY MOUTH DAILY  Qty: 90 tablet, Refills: 1      hydrALAZINE (APRESOLINE) 25 MG tablet TAKE 1 TABLET BY MOUTH EVERY 8 HOURS  Qty: 270 tablet, Refills: 2      simvastatin (ZOCOR) 40 MG tablet TAKE 1 TABLET BY MOUTH AT BEDTIME  Qty: 90 tablet, Refills: 1      colchicine (COLCRYS) 0.6 MG tablet 1 po prn for gout attack may repeat in 2 hours no more than 2/24 hrs  Qty: 10 tablet, Refills: 1      Vitamin D (CHOLECALCIFEROL) 1000 UNITS CAPS capsule Take 2,000 Units by mouth daily.      indomethacin (INDOCIN) 25 MG capsule Take 1 capsule by mouth 2 times daily (with meals).  Qty: 60 capsule, Refills: 2            ASA STATUS: 3  NPO since: > 8 hrs   Patient identified/chart reviewed: yes  CV:   + HTN    + HLD    No known CAD   PULMONARY: no  Asthma    no COPD  no OSA (sleep study negative) +morbid obesity   ENDOCRINE: no DM     no Thyroid Disease   GI: h/o alcoholic pancreatitis (3-4 cans beer weekly)   GU: no known renal disease   NEURO/PSYCH: no CVA   no Seizure Disorder   MUSCULOSKELETAL: no DJD +gout   HEME/ONC: no DVT  no PE   no  Anemia      OTHER:   EKG: NSR   Echocardiogram:   COMMENTS: took amlodipine and metoprolol this AM        PSH:  has past surgical history that includes Colonoscopy (02/04/13); Prostate biopsy; and other surgical history.    Patient Active Problem List   Diagnosis   ??? Gout   ??? Hyperlipidemia   ??? HTN (hypertension)   ??? Vitamin D deficiency   ??? Alcohol abuse   ??? Well adult exam   ??? Prostate CA ([place])   ??? Preop examination     PMH:  Past Medical History   Diagnosis Date   ??? Gout    ??? Hyperlipidemia    ??? HTN (hypertension)    ??? Keloids 12/2003     multiple keloids on head   ??? Vitamin D deficiency    ???  Clostridium difficile infection 08/01/2010   ??? Pancreatitis, alcoholic 93/26/71-24/58/09   ??? Cancer ([place]) 2016     Prostate Dr Marlene Lard      PERSONAL/FAMILY ANESTHESIA PROBLEMS: no     AIRWAY ASSESSMENT:  MALLAMPATI: II DENTITION: front chipped tooth, several missing, none loose per patient ROM: full     ANESTHETIC PLAN: GA with ETT, standard ASA monitors; Video laryngoscope for intubation    If peri-operative block planned, describe:    CONSENT: Risks/benefits/options/questions discussed. Patient agrees:  yes

## 2014-12-21 LAB — COMPREHENSIVE METABOLIC PANEL
ALT: 26 U/L (ref 10–40)
AST: 36 U/L (ref 15–37)
Albumin/Globulin Ratio: 1.1 (ref 1.1–2.2)
Albumin: 4.1 g/dL (ref 3.4–5.0)
Alkaline Phosphatase: 53 U/L (ref 40–129)
Anion Gap: 15 (ref 3–16)
BUN: 5 mg/dL — ABNORMAL LOW (ref 7–20)
CO2: 24 mmol/L (ref 21–32)
Calcium: 8.6 mg/dL (ref 8.3–10.6)
Chloride: 99 mmol/L (ref 99–110)
Creatinine: 0.8 mg/dL — ABNORMAL LOW (ref 0.9–1.3)
GFR African American: >60 (ref >60)
GFR Non-African American: >60 (ref >60)
Globulin: 3.8 g/dL
Glucose: 162 mg/dL — ABNORMAL HIGH (ref 70–99)
Potassium: 4.9 mmol/L (ref 3.5–5.1)
Sodium: 138 mmol/L (ref 136–145)
Total Bilirubin: 0.7 mg/dL (ref 0.0–1.0)
Total Protein: 7.9 g/dL (ref 6.4–8.2)

## 2014-12-21 LAB — CBC
Hematocrit: 38.6 % — ABNORMAL LOW (ref 40.5–52.5)
Hemoglobin: 12.6 g/dL — ABNORMAL LOW (ref 13.5–17.5)
MCH: 32.7 pg (ref 26.0–34.0)
MCHC: 32.7 g/dL (ref 31.0–36.0)
MCV: 100.1 fL — ABNORMAL HIGH (ref 80.0–100.0)
MPV: 8 fL (ref 5.0–10.5)
Platelets: 191 10*3/uL (ref 135–450)
RBC: 3.85 M/uL — ABNORMAL LOW (ref 4.20–5.90)
RDW: 14.1 % (ref 12.4–15.4)
WBC: 10.5 10*3/uL (ref 4.0–11.0)

## 2014-12-21 MED FILL — HYDRALAZINE HCL 25 MG PO TABS: 25 MG | ORAL | Qty: 1

## 2014-12-21 MED FILL — COLCHICINE 0.6 MG PO TABS: 0.6 MG | ORAL | Qty: 1

## 2014-12-21 MED FILL — ALLOPURINOL 300 MG PO TABS: 300 MG | ORAL | Qty: 1

## 2014-12-21 MED FILL — AMLODIPINE BESYLATE 5 MG PO TABS: 5 MG | ORAL | Qty: 1

## 2014-12-21 MED FILL — TRAMADOL HCL 50 MG PO TABS: 50 MG | ORAL | Qty: 1

## 2014-12-21 MED FILL — KCL IN DEXTROSE-NACL 20-5-0.45 MEQ/L-%-% IV SOLN: INTRAVENOUS | Qty: 1000

## 2014-12-21 MED FILL — AMPICILLIN-SULBACTAM SODIUM 1.5 (1-0.5) G IJ SOLR: 1.5 (1-0.5) g | INTRAMUSCULAR | Qty: 1.5

## 2014-12-21 MED FILL — KETOROLAC TROMETHAMINE 15 MG/ML IJ SOLN: 15 MG/ML | INTRAMUSCULAR | Qty: 1

## 2014-12-21 MED FILL — TOPROL XL 50 MG PO TB24: 50 MG | ORAL | Qty: 1

## 2014-12-21 MED FILL — FAMOTIDINE 20 MG/2ML IV SOLN: 20 MG/2ML | INTRAVENOUS | Qty: 2

## 2014-12-21 MED FILL — LOVENOX 40 MG/0.4ML SC SOLN: 40 MG/0.4ML | SUBCUTANEOUS | Qty: 0.4

## 2014-12-21 MED FILL — MORPHINE SULFATE (PF) 2 MG/ML IV SOLN: 2 mg/mL | INTRAVENOUS | Qty: 1

## 2014-12-21 NOTE — Plan of Care (Signed)
Problem: Pain:  Goal: Pain level will decrease  Pain level will decrease   Outcome: Ongoing  Patient rating pain '4-5' on scale 0-10. Oral prn pain medication given per md orders. Patient now rating pain '2' on scale 0-10. Patient satisfied with pain control. Verbalizes understanding to notify nursing staff when pain level begins to increase. Will continue to monitor patients pain level.

## 2014-12-21 NOTE — Progress Notes (Addendum)
Pt's JP drain site leaking serosanguinous drainage from bottom of dressing. Reinforced dressing w/ gauze and medipore. Will continue to monitor.

## 2014-12-21 NOTE — Progress Notes (Signed)
Urology Attending Progress Note      Subjective: Feeling well. No GU complaints. Tolerating clears. Small amounts of flatus.     Vitals:  BP 136/93 mmHg   Pulse 79   Temp(Src) 98.4 ??F (36.9 ??C) (Oral)   Resp 16   Ht 5\' 9"  (1.753 m)   Wt 264 lb (119.75 kg)   BMI 38.97 kg/m2   SpO2 96%  Temp  Avg: 98.5 ??F (36.9 ??C)  Min: 97.9 ??F (36.6 ??C)  Max: 99 ??F (37.2 ??C)    Intake/Output Summary (Last 24 hours) at 12/21/14 1051  Last data filed at 12/21/14 1043   Gross per 24 hour   Intake   5476 ml   Output   3525 ml   Net   1951 ml       Exam: abd slightly firm and appropriately tender. Foley draining clear urine. Incisions c/d/i. JP draining small serosanguinous.     Labs:  WBC:    Lab Results   Component Value Date    WBC 10.5 12/21/2014    WBC 4.8 12/06/2009     Hemoglobin/Hematocrit:  Lab Results   Component Value Date    HGB 12.6 12/21/2014    HCT 38.6 12/21/2014     BMP:  Lab Results   Component Value Date    NA 138 12/21/2014    K 4.9 12/21/2014    CL 99 12/21/2014    CO2 24 12/21/2014    BUN 5 12/21/2014    LABALBU 4.1 12/21/2014    CREATININE 0.8 12/21/2014    CREATININE 0.8 07/26/2010    CALCIUM 8.6 12/21/2014    GFRAA >60 12/21/2014    GFRAA >60 11/08/2011    LABGLOM >60 12/21/2014    LABGLOM 95 12/06/2009     PT/INR:    Lab Results   Component Value Date    PROTIME 14.2 07/27/2010    INR 1.1 07/27/2010     PTT:  No results found for: APTT[APTT        Antibiotic Therapy:  Unasyn     Imaging: No new GU      Impression/Plan: 54 yo M POD#1 robotic prostatectomy 2/2 prostate cancer  -Will advance diet slowly to full liquids after bowel fxn returns. Clears for now.   -Will keep foley for one week and remove for voiding trial as outpatient   -JP creatinine in AM. Will remove tomorrow if Cr is stable  -Continue abx  -Ambulate  -Possible discharge in tomorrow if continues to do well    Halford Chessman, NP

## 2014-12-22 LAB — CREATININE, BODY FLUID: Creat, Fluid: 0.6 mg/dL

## 2014-12-22 MED ORDER — CIPROFLOXACIN HCL 500 MG PO TABS
500 MG | ORAL_TABLET | Freq: Two times a day (BID) | ORAL | Status: AC
Start: 2014-12-22 — End: 2014-12-25

## 2014-12-22 MED ORDER — DOCUSATE SODIUM 100 MG PO CAPS
100 MG | ORAL_CAPSULE | Freq: Two times a day (BID) | ORAL | Status: DC | PRN
Start: 2014-12-22 — End: 2017-07-14

## 2014-12-22 MED ORDER — TRAMADOL HCL 50 MG PO TABS
50 MG | ORAL_TABLET | Freq: Four times a day (QID) | ORAL | Status: AC | PRN
Start: 2014-12-22 — End: 2015-01-01

## 2014-12-22 MED ORDER — METOPROLOL SUCCINATE ER 50 MG PO TB24
50 MG | ORAL_TABLET | Freq: Every evening | ORAL | Status: DC
Start: 2014-12-22 — End: 2015-03-15

## 2014-12-22 MED FILL — AMPICILLIN-SULBACTAM SODIUM 1.5 (1-0.5) G IJ SOLR: 1.5 (1-0.5) g | INTRAMUSCULAR | Qty: 1.5

## 2014-12-22 MED FILL — TRAMADOL HCL 50 MG PO TABS: 50 MG | ORAL | Qty: 1

## 2014-12-22 MED FILL — KCL IN DEXTROSE-NACL 20-5-0.45 MEQ/L-%-% IV SOLN: INTRAVENOUS | Qty: 1000

## 2014-12-22 MED FILL — AMLODIPINE BESYLATE 5 MG PO TABS: 5 MG | ORAL | Qty: 1

## 2014-12-22 MED FILL — MORPHINE SULFATE (PF) 4 MG/ML IV SOLN: 4 mg/mL | INTRAVENOUS | Qty: 1

## 2014-12-22 MED FILL — HYDRALAZINE HCL 25 MG PO TABS: 25 MG | ORAL | Qty: 1

## 2014-12-22 MED FILL — LOVENOX 40 MG/0.4ML SC SOLN: 40 MG/0.4ML | SUBCUTANEOUS | Qty: 0.4

## 2014-12-22 MED FILL — FAMOTIDINE 20 MG/2ML IV SOLN: 20 MG/2ML | INTRAVENOUS | Qty: 2

## 2014-12-22 MED FILL — TOPROL XL 50 MG PO TB24: 50 MG | ORAL | Qty: 1

## 2014-12-22 MED FILL — ALLOPURINOL 300 MG PO TABS: 300 MG | ORAL | Qty: 1

## 2014-12-22 MED FILL — KETOROLAC TROMETHAMINE 15 MG/ML IJ SOLN: 15 MG/ML | INTRAMUSCULAR | Qty: 1

## 2014-12-22 NOTE — Progress Notes (Signed)
Urology Attending Progress Note      Subjective: C/o mild abdominal discomfort. Denies flatus or BM. Tolerating clear diet.     Vitals:  BP 131/89 mmHg   Pulse 74   Temp(Src) 98.5 ??F (36.9 ??C) (Oral)   Resp 16   Ht 5\' 9"  (1.753 m)   Wt 264 lb (119.75 kg)   BMI 38.97 kg/m2   SpO2 95%  Temp  Avg: 98 ??F (36.7 ??C)  Min: 97.4 ??F (36.3 ??C)  Max: 98.5 ??F (36.9 ??C)    Intake/Output Summary (Last 24 hours) at 12/22/14 1013  Last data filed at 12/22/14 0955   Gross per 24 hour   Intake   1715 ml   Output   4832 ml   Net  -3117 ml       Exam: abd slightly firm and appropriately tender. Incisions c/d/i. Foley draining clear pink-tinged urine. JP draining moderate amounts of serosanguinous.     Labs:  WBC:    Lab Results   Component Value Date    WBC 10.5 12/21/2014    WBC 4.8 12/06/2009     Hemoglobin/Hematocrit:  Lab Results   Component Value Date    HGB 12.6 12/21/2014    HCT 38.6 12/21/2014     BMP:  Lab Results   Component Value Date    NA 138 12/21/2014    K 4.9 12/21/2014    CL 99 12/21/2014    CO2 24 12/21/2014    BUN 5 12/21/2014    LABALBU 4.1 12/21/2014    CREATININE 0.8 12/21/2014    CREATININE 0.8 07/26/2010    CALCIUM 8.6 12/21/2014    GFRAA >60 12/21/2014    GFRAA >60 11/08/2011    LABGLOM >60 12/21/2014    LABGLOM 95 12/06/2009     PT/INR:    Lab Results   Component Value Date    PROTIME 14.2 07/27/2010    INR 1.1 07/27/2010     PTT:  No results found for: APTT[APTT      Antibiotic Therapy:  Unasyn     Imaging: No new GU       Impression/Plan: 54 yo M POD#1 robotic prostatectomy 2/2 prostate cancer   -Will advance diet as tolerates  -JP creatinine @ 0.6. Will remove today  -Continue abx  -Ambulate  -Foley to remain for one week   -Possible discharge later today or tomorrow.   -Will follow as outpatient in one week to remove foley for voiding trial     Halford Chessman, NP

## 2014-12-22 NOTE — Plan of Care (Signed)
Problem: Falls - Risk of  Goal: Absence of falls  Outcome: Met This Shift  Patient free of falls/injury during duration of shift. Patient assessed as a medium fall risk. Bed in lowest position, wheels locked, call light w/in reach, 2/4 side rails up. Patient instructed to call for assistance prior to getting out of bed. Will continue to monitor.    Problem: Pain:  Goal: Pain level will decrease  Pain level will decrease   Outcome: Ongoing  Patient reported a pain level of a 5 on a scale of 1-10 located in groin area. Patient medicated w/ 1 tablet tramadol & IV morphine and IV tramadol for breakthrough pain. Will continue to assess pain level and administer prn medications as ordered.

## 2014-12-22 NOTE — Discharge Summary (Signed)
Urology Discharge Summary      Patient Identification  Phillip Harper is a 54 y.o. male.  DOB:  April 12, 1961  Admit Date:  12/20/2014    Discharge date:   No discharge date for patient encounter.                                   Disposition: home    Discharge Diagnoses:   Patient Active Problem List   Diagnosis   ??? Gout   ??? Hyperlipidemia   ??? HTN (hypertension)   ??? Vitamin D deficiency   ??? Alcohol abuse   ??? Well adult exam   ??? Prostate CA ([place])   ??? Preop examination       Surgery: Robotic Prostatectomy     Activity:  no lifting >10lbs, Driving for one week, or Strenuous exercise for 6 weeks    Condition on discharge: stable     Follow-up: with Dr. Lorenz Coaster in one week to remove foley. Office will call with appointment. Call with any questions/concerns [facility] course: 54 yo M elected to undergo the above procedure. Hospital course as expected.  Discharged in stable condition.    Halford Chessman, NP

## 2014-12-22 NOTE — Progress Notes (Signed)
Discharge instructions and prescriptions reviewed with patient and his wife. Verbalizes understanding of instructions. Leg bag and standard collection bag demonstrated for patient and provided with clean supplies. Both the patient and his wife performs back demonstration. IV removed. Pain medication given prior to discharge. Patient walked out with wife for discharge home.

## 2014-12-22 NOTE — Progress Notes (Signed)
Pt has had 285 mL of serosanguinous drainage emptied from JP drain during duration of shift. Dr. Lorenz Coaster informed. No new orders. Will continue to monitor and record intake and output.

## 2015-01-18 MED ORDER — HYDRALAZINE HCL 25 MG PO TABS
25 MG | ORAL_TABLET | ORAL | Status: DC
Start: 2015-01-18 — End: 2015-04-28

## 2015-01-25 ENCOUNTER — Ambulatory Visit
Admit: 2015-01-25 | Discharge: 2015-01-25 | Payer: BLUE CROSS/BLUE SHIELD | Attending: Internal Medicine | Primary: Internal Medicine

## 2015-01-25 DIAGNOSIS — C61 Malignant neoplasm of prostate: Secondary | ICD-10-CM

## 2015-01-25 NOTE — Assessment & Plan Note (Signed)
Continue current meds

## 2015-01-25 NOTE — Assessment & Plan Note (Signed)
No recent symptoms  Cont allopurinol as noted

## 2015-01-25 NOTE — Assessment & Plan Note (Signed)
Stable continue current meds and return in 3 mo.

## 2015-01-25 NOTE — Assessment & Plan Note (Signed)
No further Tx needed but cont with incontinence

## 2015-01-25 NOTE — Progress Notes (Signed)
Subjective:      Patient ID: RANDOM Phillip Harper is a 54 y.o. male.    HPI Comments: Here today for follow up of chronic problems see problem list, no new c/o feels good s/p prostatectomy for CA as noted all nodes were neg     Hypertension     Hyperlipidemia         Review of Systems   Constitutional: Negative.    HENT: Positive for congestion.    Eyes: Negative.         Glasses   Respiratory: Negative.         Sleep study was within normal limits no  c-pap needed    Cardiovascular: Negative.    Gastrointestinal: Negative.    Endocrine: Negative.    Genitourinary: Positive for frequency and urgency.        Nocturia -Prostate Ca as noted surgery as per [name]  Cont with incontinence wears pads    Musculoskeletal: Negative.         Gout is stable since taking allopurinol    Skin: Negative.    Allergic/Immunologic: Negative.    Neurological: Negative.    Hematological: Negative.    Psychiatric/Behavioral: Negative.        Objective:   Physical Exam   Constitutional: He is oriented to person, place, and time. He appears well-developed and well-nourished.   HENT:   Head: Normocephalic and atraumatic.   Nose: Nose normal.   Mouth/Throat: Oropharynx is clear and moist.   Eyes: Conjunctivae and EOM are normal. Pupils are equal, round, and reactive to light.   Neck: Normal range of motion. Neck supple. No tracheal deviation present. No thyromegaly present.   Cardiovascular: Normal rate, regular rhythm, normal heart sounds and intact distal pulses.    No murmur heard.  Pulmonary/Chest: Effort normal and breath sounds normal.   Abdominal: Soft. Bowel sounds are normal.   obese   Genitourinary:   Genitourinary Comments: Per [name]    Musculoskeletal: Normal range of motion. Tenderness: low back  improved.   Neurological: He is alert and oriented to person, place, and time. He has normal reflexes.   Skin: Skin is warm and dry.   Psychiatric: He has a normal mood and affect. His behavior is normal.       Assessment:               Plan:        Prostate CA ([place])  No further Tx needed but cont with incontinence     Alcohol abuse  Still occ beer 1-2/week     Gout  No recent symptoms  Cont allopurinol as noted     HTN (hypertension)  Stable continue current meds and return in 3 mo.        Hyperlipidemia  Stable continue current meds and return in 3 mo.        Vitamin D deficiency  Continue current meds

## 2015-01-25 NOTE — Assessment & Plan Note (Signed)
Still occ beer 1-2/week

## 2015-02-16 MED ORDER — AMLODIPINE BESYLATE 5 MG PO TABS
5 MG | ORAL_TABLET | ORAL | 1 refills | Status: DC
Start: 2015-02-16 — End: 2015-03-15

## 2015-03-10 MED ORDER — ALLOPURINOL 300 MG PO TABS
300 MG | ORAL_TABLET | ORAL | 1 refills | Status: DC
Start: 2015-03-10 — End: 2015-03-13

## 2015-03-13 MED ORDER — ALLOPURINOL 300 MG PO TABS
300 MG | ORAL_TABLET | ORAL | 1 refills | Status: DC
Start: 2015-03-13 — End: 2015-10-02

## 2015-03-13 NOTE — Telephone Encounter (Signed)
Script e-scribed to the pharmacy and message left on machine for patient.

## 2015-03-13 NOTE — Telephone Encounter (Signed)
Patient needs a refill on  Allopurinol and any other medications that are due for refill medications to go to  [facility]   Patient is no longer using [facility]   Aware if 24 hrs         Pharmacy: [facility]

## 2015-03-15 MED ORDER — AMLODIPINE BESYLATE 5 MG PO TABS
5 MG | ORAL_TABLET | ORAL | 1 refills | Status: DC
Start: 2015-03-15 — End: 2015-09-09

## 2015-03-15 MED ORDER — METOPROLOL SUCCINATE ER 50 MG PO TB24
50 MG | ORAL_TABLET | Freq: Every evening | ORAL | 1 refills | Status: DC
Start: 2015-03-15 — End: 2015-10-02

## 2015-03-15 MED ORDER — SIMVASTATIN 40 MG PO TABS
40 MG | ORAL_TABLET | ORAL | 1 refills | Status: DC
Start: 2015-03-15 — End: 2015-09-17

## 2015-03-15 MED ORDER — MELOXICAM 7.5 MG PO TABS
7.5 MG | ORAL_TABLET | Freq: Every day | ORAL | 1 refills | Status: DC
Start: 2015-03-15 — End: 2015-10-05

## 2015-03-15 MED ORDER — COLCHICINE 0.6 MG PO TABS
0.6 MG | ORAL_TABLET | ORAL | 1 refills | Status: DC
Start: 2015-03-15 — End: 2021-12-06

## 2015-03-15 NOTE — Telephone Encounter (Signed)
Pt Needs the following. Pt switched pharmacies. Pharm on file is current. Amlodipine, Simvastatin, Metropolol, Meloxicam, Colcrys.

## 2015-03-15 NOTE — Telephone Encounter (Signed)
Scripts e-scribed to the pharmacy.    Patient advised.

## 2015-04-28 ENCOUNTER — Ambulatory Visit
Admit: 2015-04-28 | Discharge: 2015-04-28 | Payer: BLUE CROSS/BLUE SHIELD | Attending: Internal Medicine | Primary: Internal Medicine

## 2015-04-28 DIAGNOSIS — F1010 Alcohol abuse, uncomplicated: Secondary | ICD-10-CM

## 2015-04-28 MED ORDER — HYDRALAZINE HCL 25 MG PO TABS
25 MG | ORAL_TABLET | ORAL | 2 refills | Status: DC
Start: 2015-04-28 — End: 2016-01-29

## 2015-04-28 NOTE — Progress Notes (Signed)
Subjective:      Patient ID: Phillip Harper is a 54 y.o. male.    HPI Comments: Here today for follow up of chronic problems see problem list, no new c/o feels good     Hypertension     Hyperlipidemia         Review of Systems   Constitutional: Negative.    HENT: Positive for congestion.    Eyes: Negative.         Glasses   Respiratory: Negative.         Sleep study was within normal limits no  c-pap needed    Cardiovascular: Negative.    Gastrointestinal: Negative.    Endocrine: Negative.    Genitourinary: Positive for frequency and urgency.        Nocturia -Prostate Ca as noted surgery as per [name]  Cont with incontinence wears pads occ    Musculoskeletal: Negative.         Gout is stable since taking allopurinol    Skin: Negative.    Allergic/Immunologic: Negative.    Neurological: Negative.    Hematological: Negative.    Psychiatric/Behavioral: Negative.        Objective:   Physical Exam   Constitutional: He is oriented to person, place, and time. He appears well-developed and well-nourished.   HENT:   Head: Normocephalic and atraumatic.   Nose: Nose normal.   Mouth/Throat: Oropharynx is clear and moist.   Eyes: Conjunctivae and EOM are normal. Pupils are equal, round, and reactive to light.   Neck: Normal range of motion. Neck supple. No tracheal deviation present. No thyromegaly present.   Cardiovascular: Normal rate, regular rhythm, normal heart sounds and intact distal pulses.    No murmur heard.  Pulmonary/Chest: Effort normal and breath sounds normal.   Abdominal:   obese   Musculoskeletal: Normal range of motion. Tenderness: low back  improved.   Neurological: He is alert and oriented to person, place, and time. He has normal reflexes.   Skin: Skin is warm and dry.   Psychiatric: He has a normal mood and affect. His behavior is normal.       Assessment:              Plan:        Alcohol abuse  Cont only occ beer     Gout  No recent symptoms meds as noted     HTN (hypertension)  Stable continue current  meds and return in 3 mo.        Hyperlipidemia  Stable continue current meds and return in 3 mo.    cpe     Prostate CA ([place])  Last PSA was <0.4 will check with CPE in 3 mo     Vitamin D deficiency  Continue current meds

## 2015-04-28 NOTE — Assessment & Plan Note (Signed)
Stable continue current meds and return in 3 mo.    cpe

## 2015-04-28 NOTE — Assessment & Plan Note (Signed)
Stable continue current meds and return in 3 mo.

## 2015-04-28 NOTE — Assessment & Plan Note (Signed)
Last PSA was <0.4 will check with CPE in 3 mo

## 2015-04-28 NOTE — Assessment & Plan Note (Signed)
Cont only occ beer

## 2015-04-28 NOTE — Assessment & Plan Note (Signed)
Continue current meds

## 2015-04-28 NOTE — Assessment & Plan Note (Signed)
No recent symptoms meds as noted

## 2015-07-28 ENCOUNTER — Encounter: Attending: Internal Medicine | Primary: Internal Medicine

## 2015-09-12 MED ORDER — AMLODIPINE BESYLATE 5 MG PO TABS
5 MG | ORAL_TABLET | ORAL | 0 refills | Status: DC
Start: 2015-09-12 — End: 2015-12-09

## 2015-09-18 MED ORDER — SIMVASTATIN 40 MG PO TABS
40 MG | ORAL_TABLET | ORAL | 0 refills | Status: DC
Start: 2015-09-18 — End: 2015-12-20

## 2015-10-02 MED ORDER — ALLOPURINOL 300 MG PO TABS
300 MG | ORAL_TABLET | ORAL | 0 refills | Status: DC
Start: 2015-10-02 — End: 2016-01-01

## 2015-10-02 MED ORDER — METOPROLOL SUCCINATE ER 50 MG PO TB24
50 MG | ORAL_TABLET | ORAL | 0 refills | Status: DC
Start: 2015-10-02 — End: 2018-01-22

## 2015-10-05 MED ORDER — MELOXICAM 7.5 MG PO TABS
7.5 MG | ORAL_TABLET | ORAL | 0 refills | Status: DC
Start: 2015-10-05 — End: 2016-01-01

## 2015-10-16 ENCOUNTER — Ambulatory Visit
Admit: 2015-10-16 | Discharge: 2015-10-16 | Payer: BLUE CROSS/BLUE SHIELD | Attending: Internal Medicine | Primary: Internal Medicine

## 2015-10-16 DIAGNOSIS — Z0000 Encounter for general adult medical examination without abnormal findings: Secondary | ICD-10-CM

## 2015-10-16 NOTE — Assessment & Plan Note (Signed)
Within normal limits for age- cont to work no ADL issues,immunizations up to date, no depression ,no cognitive impairment  Colonoscopy up to date  Eye exam up to date  Exercises as tolerated    No living will but does not want resuscitation   Findings and recommendations discussed with Pt   Labs pending

## 2015-10-16 NOTE — Assessment & Plan Note (Signed)
Stable no change in meds return in 3 mo. Labs pending

## 2015-10-16 NOTE — Assessment & Plan Note (Signed)
Stable as noted check labs

## 2015-10-16 NOTE — Assessment & Plan Note (Signed)
Repeat PSA pending to see Dr Ross Marcus in a few weeks

## 2015-10-16 NOTE — Assessment & Plan Note (Signed)
Still presenrt as noted

## 2015-10-16 NOTE — Assessment & Plan Note (Signed)
No recent symptoms meds as noted

## 2015-10-16 NOTE — Progress Notes (Signed)
Subjective:      Patient ID: Phillip Harper is a 54 y.o. male.    HPI Comments: Here today for complete physical and review of listed chronic problem       Review of Systems   Constitutional: Negative.  Unexpected weight change: up a few.   HENT: Negative.    Eyes: Negative.         Glasses   Respiratory: Negative.         Sleep study was within normal limits no  c-pap needed    Cardiovascular: Negative.    Gastrointestinal: Negative.    Endocrine: Negative.    Genitourinary: Positive for frequency and urgency.        Nocturia    Musculoskeletal: Negative.         Gout is stable since taking allopurinol    Skin: Negative.         Acne keloidas nuchae back of neck as noted    Allergic/Immunologic: Negative.    Neurological: Negative.    Hematological: Negative.    Psychiatric/Behavioral: Negative.        Objective:   Physical Exam   Constitutional: He is oriented to person, place, and time. He appears well-developed and well-nourished.   HENT:   Head: Normocephalic and atraumatic.   Right Ear: External ear normal.   Left Ear: External ear normal.   Nose: Nose normal.   Mouth/Throat: Oropharynx is clear and moist.   Eyes: Conjunctivae and EOM are normal. Pupils are equal, round, and reactive to light.   Neck: Normal range of motion. Neck supple. No tracheal deviation present. No thyromegaly present.   Cardiovascular: Normal rate, regular rhythm, normal heart sounds and intact distal pulses.    No murmur heard.  Pulmonary/Chest: Effort normal and breath sounds normal.   Abdominal: Soft. Bowel sounds are normal.   obese   Genitourinary:   Genitourinary Comments: Per Dr Phillip Harper   Musculoskeletal: Normal range of motion. He exhibits tenderness (low back ).   Neurological: He is alert and oriented to person, place, and time. He has normal reflexes.   Skin: Skin is warm and dry.   Acne keloidas nuchae back of neck as noted    Psychiatric: He has a normal mood and affect. His behavior is normal.       Assessment:               Plan:        Alcohol abuse  Stable as noted check labs     Gout  No recent symptoms meds as noted     HTN (hypertension)  Stable no change in meds return in 3 mo. Labs pending     Hyperlipidemia  Stable no change in meds return in 3 mo. Labs pending     Prostate CA Phillip Harper)  Repeat PSA pending to see Dr Phillip Harper in a few weeks     Vitamin D deficiency  Stable no change in meds return in 3 mo. Labs pending     Well adult exam  Within normal limits for age- cont to work no ADL issues,immunizations up to date, no depression ,no cognitive impairment  Colonoscopy up to date  Eye exam up to date  Exercises as tolerated    No living will but does not want resuscitation   Findings and recommendations discussed with Pt   Labs pending

## 2015-10-17 LAB — CBC WITH AUTO DIFFERENTIAL
Basophils %: 0.5 %
Basophils Absolute: 0 10*3/uL (ref 0.0–0.2)
Eosinophils %: 1.9 %
Eosinophils Absolute: 0.1 10*3/uL (ref 0.0–0.6)
Hematocrit: 42.3 % (ref 40.5–52.5)
Hemoglobin: 13.6 g/dL (ref 13.5–17.5)
Lymphocytes %: 23.3 %
Lymphocytes Absolute: 1.8 10*3/uL (ref 1.0–5.1)
MCH: 32.5 pg (ref 26.0–34.0)
MCHC: 32.2 g/dL (ref 31.0–36.0)
MCV: 100.8 fL — ABNORMAL HIGH (ref 80.0–100.0)
MPV: 8.4 fL (ref 5.0–10.5)
Monocytes %: 9.2 %
Monocytes Absolute: 0.7 10*3/uL (ref 0.0–1.3)
Neutrophils %: 65.1 %
Neutrophils Absolute: 5.1 10*3/uL (ref 1.7–7.7)
Platelets: 237 10*3/uL (ref 135–450)
RBC: 4.2 M/uL (ref 4.20–5.90)
RDW: 15.1 % (ref 12.4–15.4)
WBC: 7.9 10*3/uL (ref 4.0–11.0)

## 2015-10-17 LAB — COMPREHENSIVE METABOLIC PANEL
ALT: 32 U/L (ref 10–40)
AST: 31 U/L (ref 15–37)
Albumin/Globulin Ratio: 1.2 (ref 1.1–2.2)
Albumin: 4.5 g/dL (ref 3.4–5.0)
Alkaline Phosphatase: 68 U/L (ref 40–129)
Anion Gap: 20 — ABNORMAL HIGH (ref 3–16)
BUN: 6 mg/dL — ABNORMAL LOW (ref 7–20)
CO2: 20 mmol/L — ABNORMAL LOW (ref 21–32)
Calcium: 9.9 mg/dL (ref 8.3–10.6)
Chloride: 101 mmol/L (ref 99–110)
Creatinine: 0.6 mg/dL — ABNORMAL LOW (ref 0.9–1.3)
GFR African American: >60 (ref >60)
GFR Non-African American: >60 (ref >60)
Globulin: 3.8 g/dL
Glucose: 97 mg/dL (ref 70–99)
Potassium: 4.4 mmol/L (ref 3.5–5.1)
Sodium: 141 mmol/L (ref 136–145)
Total Bilirubin: 0.5 mg/dL (ref 0.0–1.0)
Total Protein: 8.3 g/dL — ABNORMAL HIGH (ref 6.4–8.2)

## 2015-10-17 LAB — URINALYSIS
Bilirubin Urine: NEGATIVE
Blood, Urine: NEGATIVE
Glucose, Ur: NEGATIVE mg/dL
Ketones, Urine: NEGATIVE mg/dL
Leukocyte Esterase, Urine: NEGATIVE
Nitrite, Urine: NEGATIVE
Protein, UA: NEGATIVE mg/dL
Specific Gravity, UA: 1.012 (ref 1.005–1.030)
Urobilinogen, Urine: 0.2 E.U./dL (ref <2.0)
pH, UA: 6 (ref 5.0–8.0)

## 2015-10-17 LAB — PSA PROSTATIC SPECIFIC ANTIGEN: PSA: <0.01 ng/mL (ref 0.00–4.00)

## 2015-10-17 LAB — LIPID PANEL
Cholesterol, Total: 127 mg/dL (ref 0–199)
HDL: 50 mg/dL (ref 40–60)
LDL Calculated: 60 mg/dL (ref <100)
Triglycerides: 86 mg/dL (ref 0–150)
VLDL Cholesterol Calculated: 17 mg/dL

## 2015-10-17 LAB — TSH: TSH: 2.38 u[IU]/mL (ref 0.27–4.20)

## 2015-10-17 LAB — HEMOGLOBIN A1C
Hemoglobin A1C: 6.3 %
eAG: 134.1 mg/dL

## 2015-10-17 LAB — VITAMIN D 25 HYDROXY: Vit D, 25-Hydroxy: 40.3 ng/mL (ref >=30)

## 2015-11-13 ENCOUNTER — Encounter: Attending: Internal Medicine | Primary: Internal Medicine

## 2015-12-11 MED ORDER — AMLODIPINE BESYLATE 5 MG PO TABS
5 MG | ORAL_TABLET | ORAL | 0 refills | Status: DC
Start: 2015-12-11 — End: 2016-04-18

## 2015-12-20 MED ORDER — SIMVASTATIN 40 MG PO TABS
40 MG | ORAL_TABLET | ORAL | 1 refills | Status: DC
Start: 2015-12-20 — End: 2016-07-30

## 2015-12-20 NOTE — Telephone Encounter (Signed)
LOV:10/17/2015  R-1  Pt has an appt on 01/15/2016

## 2015-12-25 ENCOUNTER — Encounter: Admit: 2015-12-25 | Primary: Internal Medicine

## 2015-12-25 ENCOUNTER — Inpatient Hospital Stay
Admit: 2015-12-25 | Discharge: 2015-12-26 | Disposition: A | Discharge status: Home / Self Care | Attending: Emergency Medicine

## 2015-12-25 DIAGNOSIS — J4520 Mild intermittent asthma, uncomplicated: Secondary | ICD-10-CM

## 2015-12-25 LAB — POCT VENOUS
BNP: <15 pg/mL (ref 0.0–99.9)
Base Excess, Ven: 1 (ref <=3)
CO2: 25 mmol/L (ref 21–32)
Calcium, Ionized: 1.09 mmol/L — ABNORMAL LOW (ref 1.12–1.32)
GFR African American: >60
GFR Non-African American: >60 (ref >60)
HCO3, Venous: 26.1 mmol/L (ref 23.0–29.0)
Lactate: 1.8 mmol/L (ref 0.40–2.00)
O2 Sat, Ven: 91 %
POC Anion Gap: 14 (ref 10–20)
POC BUN: <3 mg/dL — ABNORMAL LOW (ref 7–18)
POC Chloride: 105 mmol/L (ref 99–110)
POC Creatinine: 1 mg/dL (ref 0.9–1.3)
POC Glucose: 136 mg/dl — ABNORMAL HIGH (ref 70–99)
POC Potassium: 3.5 mmol/L (ref 3.5–5.1)
POC Sodium: 144 mmol/L (ref 136–145)
TC02 (Calc), Ven: 27 mmol/L
pCO2, Ven: 42.1 mm Hg (ref 40.0–50.0)
pH, Ven: 7.4 (ref 7.320–7.420)
pO2, Ven: 61 mm Hg

## 2015-12-25 LAB — CBC WITH AUTO DIFFERENTIAL
Basophils %: 1.2 %
Basophils Absolute: 0.1 10*3/uL (ref 0.0–0.2)
Eosinophils %: 7.6 %
Eosinophils Absolute: 0.4 10*3/uL (ref 0.0–0.6)
Hematocrit: 41.8 % (ref 40.5–52.5)
Hemoglobin: 13.9 g/dL (ref 13.5–17.5)
Lymphocytes %: 38 %
Lymphocytes Absolute: 1.9 10*3/uL (ref 1.0–5.1)
MCH: 32.4 pg (ref 26.0–34.0)
MCHC: 33.1 g/dL (ref 31.0–36.0)
MCV: 97.6 fL (ref 80.0–100.0)
MPV: 7.3 fL (ref 5.0–10.5)
Monocytes %: 7.6 %
Monocytes Absolute: 0.4 10*3/uL (ref 0.0–1.3)
Neutrophils %: 45.6 %
Neutrophils Absolute: 2.2 10*3/uL (ref 1.7–7.7)
Platelets: 204 10*3/uL (ref 135–450)
RBC: 4.28 M/uL (ref 4.20–5.90)
RDW: 14.7 % (ref 12.4–15.4)
WBC: 4.9 10*3/uL (ref 4.0–11.0)

## 2015-12-25 LAB — TROPONIN: Troponin: <0.01 ng/mL (ref <0.01)

## 2015-12-25 MED ORDER — IPRATROPIUM-ALBUTEROL 0.5-2.5 (3) MG/3ML IN SOLN
Freq: Once | RESPIRATORY_TRACT | Status: AC
Start: 2015-12-25 — End: 2015-12-25
  Administered 2015-12-25: 1 via RESPIRATORY_TRACT

## 2015-12-25 MED FILL — IPRATROPIUM-ALBUTEROL 0.5-2.5 (3) MG/3ML IN SOLN: RESPIRATORY_TRACT | Qty: 3

## 2015-12-25 NOTE — ED Provider Notes (Signed)
ED Attending Attestation Note     Date of evaluation: 12/25/2015    This patient was seen by the resident physician.  I have seen and examined the patient, I agree with the workup, evaluation, management and diagnosis. The care plan has been discussed and I concur.  I have reviewed the ECG and concur with the resident's interpretation.  My assessment revealsmale who presents with persistent cough and wheeze.  Also history of lower extremity swelling.  There is no asymmetry to the legs no clinical evidence of DVT.  Has no chest pain just scant expiratory wheeze       Merrilee Seashore, MD  12/25/15 2003

## 2015-12-25 NOTE — ED Notes (Signed)
Ambulated patient to rest room and his oxygen saturation remained at 96%     Tobie Poet, MD  Resident  12/25/15 2114

## 2015-12-25 NOTE — ED Notes (Signed)
Pt back from xray.      Joseph Berkshire, RN  12/25/15 1836

## 2015-12-25 NOTE — ED Notes (Signed)
Rt paged for tx.      Joseph Berkshire, RN  12/25/15 1902

## 2015-12-25 NOTE — ED Notes (Signed)
Pt here from urgent care, pt has had progressive worsening sob for the past few weeks. Pt placed into gown and on cardia cmonitor. Pt went to urgent care and was told his legs were swollen and he needed to go to the ED.      Joseph Berkshire, RN  12/25/15 1759

## 2015-12-25 NOTE — ED Provider Notes (Signed)
Date of evaluation: 12/25/2015    Chief Complaint   Shortness of Breath and Leg Swelling      Nursing Notes, Past Medical Hx, Past Surgical Hx, Social Hx, Allergies, and Family Hx were reviewed.    History of Present Illness     Phillip Harper is a 55 y.o. male who presents with wheezing and shortness of breath and swelling in his ankles. He says he has noticed mild wheezing over the last several weeks and he had recent URI that preceded this. He denies cough, fevers or chills. He also denies chest pain.     Review of Systems     Review of Systems   Constitutional: Negative for chills and fever.   Eyes: Negative.    Respiratory: Positive for cough and wheezing. Negative for sputum production and shortness of breath.    Cardiovascular: Positive for leg swelling. Negative for chest pain, orthopnea and PND.   Gastrointestinal: Negative.    Genitourinary: Negative.    Musculoskeletal: Negative.    Skin: Negative for itching and rash.   Neurological: Negative.  Negative for headaches.   Psychiatric/Behavioral: Negative.         Past Medical, Surgical, Family, and Social History         Diagnosis Date   ??? Cancer [facility] 2016    Prostate [name]   ??? Clostridium difficile infection 08/01/2010   ??? Gout    ??? HTN (hypertension)    ??? Hyperlipidemia    ??? Keloids 12/2003    multiple keloids on head   ??? Pancreatitis, alcoholic [id number]   ??? Vitamin D deficiency          Procedure Laterality Date   ??? COLONOSCOPY  02/04/13    Dr Fredonia Highland - normal repeat 2024   ??? OTHER SURGICAL HISTORY      KELOID SURGERY   ??? PROSTATE BIOPSY     ??? PROSTATECTOMY Bilateral 12/20/14    robotic assisted laparoscopic     His family history includes Cancer in his mother and sister; Heart Disease in his father.  He reports that he has never smoked. He has never used smokeless tobacco. He reports that he drinks about 3.0 oz of alcohol per week  He reports that he does not use illicit drugs.    Medications     Discharge Medication List as of 12/25/2015   9:48 PM      CONTINUE these medications which have NOT CHANGED    Details   simvastatin (ZOCOR) 40 MG tablet TAKE 1 TABLET BY MOUTH AT BEDTIME, Disp-90 tablet, R-1Normal      amLODIPine (NORVASC) 5 MG tablet TAKE 1 TABLET BY MOUTH DAILY, Disp-90 tablet, R-0Normal      meloxicam (MOBIC) 7.5 MG tablet TAKE 1 TABLET BY MOUTH DAILY, Disp-90 tablet, R-0Normal      allopurinol (ZYLOPRIM) 300 MG tablet TAKE 1 TABLET BY MOUTH EVERY DAY, Disp-90 tablet, R-0Normal      metoprolol succinate (TOPROL XL) 50 MG extended release tablet TAKE 1 TABLET BY MOUTH EVERY NIGHT, Disp-90 tablet, R-0Normal      hydrALAZINE (APRESOLINE) 25 MG tablet TAKE 1 TABLET BY MOUTH EVERY 8 HOURS, Disp-270 tablet, R-2      docusate sodium (COLACE) 100 MG capsule Take 1 capsule by mouth 2 times daily as needed for Constipation, Disp-24 capsule, R-0      Vitamin D (CHOLECALCIFEROL) 1000 UNITS CAPS capsule Take 2,000 Units by mouth daily.      colchicine (COLCRYS) 0.6 MG tablet  1 po prn for gout attack may repeat in 2 hours no more than 2/24 hrs, Disp-10 tablet, R-1      indomethacin (INDOCIN) 25 MG capsule Take 1 capsule by mouth 2 times daily (with meals)., Disp-60 capsule, R-2             Allergies     He is allergic to shellfish-derived products.    Physical Exam     INITIAL VITALS: BP (!) 145/99   Pulse 76   Temp 98 ??F (36.7 ??C) (Oral)    Resp 16   Ht 5\' 9"  (1.753 m)   Wt 275 lb (124.7 kg)   SpO2 99%   BMI 40.61 kg/m2   Physical Exam     Diagnostic Results     EKG   Interpreted by emergency department physician Merrilee Seashore, MD  Rhythm: normal sinus   Rate: normal  Axis: normal  Ectopy: none  Conduction: normal  ST Segments: no acute change  T Waves: no acute change  Q Waves: nonspecific  Clinical Impression: no acute changes  Comparison:  2015    RADIOLOGY:  XR Chest Portable   Final Result   1. No acute cardiopulmonary abnormalities.   2. No interval changes                LABS:   Labs Reviewed   POCT VENOUS - Abnormal; Notable for the  following:        Result Value    POC Glucose 136 (*)     POC BUN <3 (*)     Calcium, Ion 1.09 (*)     All other components within normal limits   TROPONIN   CBC WITH AUTO DIFFERENTIAL   POCT CHEM BASIC W ICA   POCT LACTIC ACID (LACTATE)   POCT BLOOD GASES   POCT B-TYPE NATRIURETIC PEPTIDE (BNP)   POCT VENOUS   POCT VENOUS       RECENT VITALS:  BP: (!) 145/99, Temp: 98 ??F (36.7 ??C), Pulse: 76, Resp: 16     Procedures     none    ED Course     The patient was given the following medications:  Orders Placed This Encounter   Medications   ??? ipratropium-albuterol (DUONEB) nebulizer solution 1 ampule   ??? albuterol-ipratropium (COMBIVENT RESPIMAT) 20-100 MCG/ACT AERS inhaler     Sig: Inhale 1 puff into the lungs every 6 hours     Dispense:  1 Inhaler     Refill:  0       ED Course       CONSULTS:  [facility] DECISION MAKING     Phillip Harper is a 55 y.o. male who presents with vague wheezing after having URI several weeks ago. He does not have any wheezing on exam here. He has trace pedal edema, but his BNP was normal. Chest xray showed stable cardiomegally with no acute disease. His troponin was negative and EKG largely unchanged. He was given albuterol inhaler and instructed to use it if wheezing returns. His PCP was notified and on call physician instructed patient to call in AM for appointment.  Patient understands treatment plan    This patient was also evaluated by the attending physician. All care plans were discussed and agreed upon.    Clinical Impression     1. Reactive airway disease, mild intermittent, uncomplicated        Disposition/Plan  PATIENT REFERRED TO:  No follow-up provider specified.    DISCHARGE MEDICATIONS:  Discharge Medication List as of 12/25/2015  9:48 PM      START taking these medications    Details   albuterol-ipratropium (COMBIVENT RESPIMAT) 20-100 MCG/ACT AERS inhaler Inhale 1 puff into the lungs every 6 hours, Disp-1 Inhaler, R-0Print              DISPOSITION Decision to Discharge       Tobie Poet, MD  Resident  12/28/15 [phone]

## 2015-12-25 NOTE — ED Notes (Signed)
Pt to xray     Joseph Berkshire, RN  12/25/15 [phone]

## 2015-12-25 NOTE — ED Notes (Signed)
Pt was thankful for care. Pt had no questions regarding importance of follow up, when to return, RX, or diagnosis. Pt left with daughter.    Patient prepared for and ready to be discharged. Patient discharged at this time in no acute distress after verbalizing understanding of discharge instructions. Patient left after receiving After Visit Summary instructions.      Joseph Berkshire, RN  12/25/15 2201

## 2015-12-25 NOTE — ED Notes (Signed)
Dr cox t [facility].      Joseph Berkshire, RN  12/25/15 [phone]

## 2015-12-25 NOTE — ED Notes (Signed)
Per dr cox, pt okay to be DC'ed. Pt sitting on side of bed requesting to go home.      Joseph Berkshire, RN  12/25/15 2147

## 2015-12-26 MED ORDER — IPRATROPIUM-ALBUTEROL 20-100 MCG/ACT IN AERS
20-100 MCG/ACT | Freq: Four times a day (QID) | RESPIRATORY_TRACT | 0 refills | Status: AC
Start: 2015-12-26 — End: 2021-12-06

## 2015-12-28 LAB — EKG 12-LEAD
Atrial Rate: 80 {beats}/min
P Axis: 18 degrees
P-R Interval: 164 ms
Q-T Interval: 380 ms
QRS Duration: 112 ms
QTc Calculation (Bazett): 438 ms
R Axis: 20 degrees
T Axis: 36 degrees
Ventricular Rate: 80 {beats}/min

## 2016-01-02 MED ORDER — METOPROLOL SUCCINATE ER 50 MG PO TB24
50 MG | ORAL_TABLET | ORAL | 1 refills | Status: DC
Start: 2016-01-02 — End: 2016-04-17

## 2016-01-02 MED ORDER — ALLOPURINOL 300 MG PO TABS
300 MG | ORAL_TABLET | ORAL | 1 refills | Status: DC
Start: 2016-01-02 — End: 2016-08-25

## 2016-01-02 MED ORDER — MELOXICAM 7.5 MG PO TABS
7.5 MG | ORAL_TABLET | ORAL | 1 refills | Status: DC
Start: 2016-01-02 — End: 2016-08-29

## 2016-01-02 NOTE — Telephone Encounter (Signed)
LOV:10/16/2015  R-1  Pt has upcoming appt.

## 2016-01-15 ENCOUNTER — Ambulatory Visit
Admit: 2016-01-15 | Discharge: 2016-01-15 | Payer: BLUE CROSS/BLUE SHIELD | Attending: Internal Medicine | Primary: Internal Medicine

## 2016-01-15 DIAGNOSIS — I10 Essential (primary) hypertension: Secondary | ICD-10-CM

## 2016-01-15 NOTE — Assessment & Plan Note (Signed)
Continue current meds

## 2016-01-15 NOTE — Assessment & Plan Note (Signed)
Stable continue current meds and return in 3 mo.

## 2016-01-15 NOTE — Progress Notes (Signed)
Subjective:      Patient ID: Phillip Harper is a 55 y.o. male.    HPI Comments: Here today for follow up of chronic problems see problem list, no new c/o feels good     Hyperlipidemia   Shortness of breath: mild doe    Hypertension   Shortness of breath: mild doe        Review of Systems   Constitutional: Positive for unexpected weight change (down 3# ).   HENT: Positive for congestion and rhinorrhea.    Eyes: Negative.         Glasses   Respiratory: Negative for choking and wheezing. Shortness of breath: mild doe          Recent Er visit for reactive airway disease see reports ok now    Cardiovascular: Negative.    Gastrointestinal: Negative.    Endocrine: Negative.    Genitourinary: Positive for frequency and urgency.        Nocturia    Musculoskeletal: Negative.         Gout is stable since taking allopurinol    Skin: Negative.         Acne keloidas nuchae back of neck as noted    Allergic/Immunologic: Negative.    Neurological: Negative.    Hematological: Negative.    Psychiatric/Behavioral: Negative.        Objective:   Physical Exam   Constitutional: He is oriented to person, place, and time. He appears well-developed and well-nourished.   HENT:   Head: Normocephalic and atraumatic.   Nose: Nose normal.   Mouth/Throat: Oropharynx is clear and moist.   Eyes: Conjunctivae and EOM are normal. Pupils are equal, round, and reactive to light.   Neck: Normal range of motion. Neck supple. No tracheal deviation present. No thyromegaly present.   Cardiovascular: Normal rate, regular rhythm, normal heart sounds and intact distal pulses.    No murmur heard.  Pulmonary/Chest: Effort normal and breath sounds normal.   Abdominal:   obese   Genitourinary:   Genitourinary Comments: Per [name]   Musculoskeletal: Normal range of motion. He exhibits tenderness (low back ).   Neurological: He is alert and oriented to person, place, and time. He has normal reflexes.   Skin: Skin is warm and dry.   Acne keloidas nuchae back of  neck as noted    Psychiatric: He has a normal mood and affect. His behavior is normal.       Assessment:              Plan:        Acne keloidalis nuchae  Stable as noted     Alcohol abuse  Cont to drink beer occ     Gout  No recent .symptoms     HTN (hypertension)  Stable continue current meds and return in 3 mo.        Hyperlipidemia  Stable continue current meds and return in 3 mo.        Prostate CA ([place])  Last PSA was <0.o1 still wears a pad occ     Vitamin D deficiency  Continue current meds

## 2016-01-15 NOTE — Assessment & Plan Note (Signed)
No recent .symptoms

## 2016-01-15 NOTE — Assessment & Plan Note (Signed)
Stable as noted

## 2016-01-15 NOTE — Assessment & Plan Note (Signed)
Cont to drink beer occ

## 2016-01-15 NOTE — Assessment & Plan Note (Signed)
Last PSA was <0.o1 still wears a pad occ

## 2016-01-29 MED ORDER — HYDRALAZINE HCL 25 MG PO TABS
25 MG | ORAL_TABLET | ORAL | 0 refills | Status: DC
Start: 2016-01-29 — End: 2018-05-20

## 2016-04-15 ENCOUNTER — Encounter

## 2016-04-15 LAB — COMPREHENSIVE METABOLIC PANEL
ALT: 24 U/L (ref 10–40)
AST: 39 U/L — ABNORMAL HIGH (ref 15–37)
Albumin/Globulin Ratio: 1 — ABNORMAL LOW (ref 1.1–2.2)
Albumin: 3.8 g/dL (ref 3.4–5.0)
Alkaline Phosphatase: 71 U/L (ref 40–129)
Anion Gap: 15 (ref 3–16)
BUN: 3 mg/dL — ABNORMAL LOW (ref 7–20)
CO2: 25 mmol/L (ref 21–32)
Calcium: 8.9 mg/dL (ref 8.3–10.6)
Chloride: 101 mmol/L (ref 99–110)
Creatinine: 0.7 mg/dL — ABNORMAL LOW (ref 0.9–1.3)
GFR African American: >60 (ref >60)
GFR Non-African American: >60 (ref >60)
Globulin: 3.8 g/dL
Glucose: 203 mg/dL — ABNORMAL HIGH (ref 70–99)
Potassium: 4.1 mmol/L (ref 3.5–5.1)
Sodium: 141 mmol/L (ref 136–145)
Total Bilirubin: <0.2 mg/dL (ref 0.0–1.0)
Total Protein: 7.6 g/dL (ref 6.4–8.2)

## 2016-04-15 LAB — CBC WITH AUTO DIFFERENTIAL
Basophils %: 0.5 %
Basophils Absolute: 0 10*3/uL (ref 0.0–0.2)
Eosinophils %: 4 %
Eosinophils Absolute: 0.3 10*3/uL (ref 0.0–0.6)
Hematocrit: 38.8 % — ABNORMAL LOW (ref 40.5–52.5)
Hemoglobin: 13.3 g/dL — ABNORMAL LOW (ref 13.5–17.5)
Lymphocytes %: 26.9 %
Lymphocytes Absolute: 2.1 10*3/uL (ref 1.0–5.1)
MCH: 35.1 pg — ABNORMAL HIGH (ref 26.0–34.0)
MCHC: 34.4 g/dL (ref 31.0–36.0)
MCV: 101.9 fL — ABNORMAL HIGH (ref 80.0–100.0)
MPV: 8 fL (ref 5.0–10.5)
Monocytes %: 10.3 %
Monocytes Absolute: 0.8 10*3/uL (ref 0.0–1.3)
Neutrophils %: 58.3 %
Neutrophils Absolute: 4.4 10*3/uL (ref 1.7–7.7)
Platelets: 279 10*3/uL (ref 135–450)
RBC: 3.81 M/uL — ABNORMAL LOW (ref 4.20–5.90)
RDW: 14.3 % (ref 12.4–15.4)
WBC: 7.6 10*3/uL (ref 4.0–11.0)

## 2016-04-15 LAB — TSH: TSH: 1.95 u[IU]/mL (ref 0.27–4.20)

## 2016-04-15 LAB — LIPID PANEL
Cholesterol, Total: 111 mg/dL (ref 0–199)
HDL: 34 mg/dL — ABNORMAL LOW (ref 40–60)
LDL Calculated: 64 mg/dL (ref <100)
Triglycerides: 67 mg/dL (ref 0–150)
VLDL Cholesterol Calculated: 13 mg/dL

## 2016-04-15 LAB — URIC ACID: Uric Acid, Serum: 4.8 mg/dL (ref 3.5–7.2)

## 2016-04-17 ENCOUNTER — Ambulatory Visit
Admit: 2016-04-17 | Discharge: 2016-04-17 | Payer: BLUE CROSS/BLUE SHIELD | Attending: Internal Medicine | Primary: Internal Medicine

## 2016-04-17 DIAGNOSIS — I10 Essential (primary) hypertension: Secondary | ICD-10-CM

## 2016-04-17 LAB — VITAMIN D 1,25 DIHYDROXY: Vit D, 1,25-Dihydroxy: 33.1 pg/mL (ref 19.9–79.3)

## 2016-04-17 NOTE — Assessment & Plan Note (Signed)
Continue current meds

## 2016-04-17 NOTE — Assessment & Plan Note (Signed)
Stable as noted

## 2016-04-17 NOTE — Assessment & Plan Note (Signed)
Still needs to DC

## 2016-04-17 NOTE — Assessment & Plan Note (Signed)
Add HgA1C  To watch diet etc

## 2016-04-17 NOTE — Assessment & Plan Note (Signed)
Recent attack controled with meds Uric acid now decreased as noted cont Allopurinol

## 2016-04-17 NOTE — Addendum Note (Signed)
Addended by: Jeremy Johann on: 04/17/2016 02:46 PM     Modules accepted: Orders

## 2016-04-17 NOTE — Addendum Note (Signed)
Addended by: Jeremy Johann on: 04/17/2016 02:49 PM     Modules accepted: Orders

## 2016-04-17 NOTE — Assessment & Plan Note (Signed)
Stable continue current meds and return in 3 mo.

## 2016-04-17 NOTE — Addendum Note (Signed)
Addended by: Jeremy Johann on: 04/17/2016 02:48 PM     Modules accepted: Orders

## 2016-04-17 NOTE — Progress Notes (Signed)
Subjective:      Patient ID: Phillip Harper is a 55 y.o. male.    HPI Comments: Here today for follow up of chronic problems see problem list, no new c/o feels good sugars up a little ? Watches diet etc     Hypertension   This is a chronic problem. The current episode started more than 1 year ago. The problem is unchanged. The problem is controlled. Pertinent negatives include no blurred vision, chest pain, headaches, palpitations or shortness of breath. There are no associated agents to hypertension. Risk factors for coronary artery disease include dyslipidemia, male gender and obesity. Past treatments include calcium channel blockers and direct vasodilators. The current treatment provides significant improvement. Compliance problems include diet and exercise.    Hyperlipidemia   Pertinent negatives include no chest pain or shortness of breath.       Review of Systems   Constitutional: Positive for unexpected weight change (down 1# ).   HENT: Positive for congestion and rhinorrhea.    Eyes: Negative.  Negative for blurred vision.        Glasses   Respiratory: Negative.  Negative for choking, shortness of breath and wheezing.    Cardiovascular: Negative.  Negative for chest pain and palpitations.   Gastrointestinal: Negative.    Endocrine: Negative.  Negative for polydipsia, polyphagia and polyuria.   Genitourinary: Positive for frequency and urgency.        Nocturia    Musculoskeletal: Negative.         Gout is stable since taking allopurinol    Skin: Negative.         Acne keloidas nuchae back of neck as noted    Allergic/Immunologic: Negative.    Neurological: Negative.  Negative for headaches.   Hematological: Negative.    Psychiatric/Behavioral: Negative.        Objective:   Physical Exam   Constitutional: He is oriented to person, place, and time. He appears well-developed and well-nourished.   HENT:   Head: Normocephalic and atraumatic.   Nose: Nose normal.   Mouth/Throat: Oropharynx is clear and moist.    Eyes: Conjunctivae and EOM are normal.   Neck: Normal range of motion. Neck supple. No tracheal deviation present. No thyromegaly present.   Cardiovascular: Normal rate, regular rhythm, normal heart sounds and intact distal pulses.    No murmur heard.  Pulmonary/Chest: Effort normal and breath sounds normal.   Abdominal:   obese   Genitourinary:   Genitourinary Comments: Per [name]   Musculoskeletal: Normal range of motion. He exhibits tenderness (low back ).   Neurological: He is alert and oriented to person, place, and time. He has normal reflexes.   Skin: Skin is warm and dry.   Acne keloidas nuchae back of neck as noted    Psychiatric: He has a normal mood and affect. His behavior is normal.       Assessment:              Plan:        Acne keloidalis nuchae  Stable as noted     Alcohol abuse  Still needs to DC     Gout  Recent attack controled with meds Uric acid now decreased as noted cont Allopurinol     HTN (hypertension)  Stable continue current meds and return in 3 mo.        Hyperlipidemia  Stable continue current meds and return in 3 mo.        Prostate CA (  [place])  Stable as noted     Vitamin D deficiency  Continue current meds     Hyperglycemia  Add HgA1C  To watch diet etc

## 2016-04-17 NOTE — Progress Notes (Signed)
Vaccine Information Sheet, "Influenza - Inactivated"  given to Chalmers A Christo, or parent/legal guardian of  Phillip Harper and verbalized understanding.    Patient responses:    Have you ever had a reaction to a flu vaccine? No  Are you able to eat eggs without adverse effects?  Yes  Do you have any current illness?  No  Have you ever had Guillian Barre Syndrome?  No    Flu vaccine given per order. Please see immunization tab.

## 2016-04-18 LAB — HEMOGLOBIN A1C
Hemoglobin A1C: 6.4 %
eAG: 137 mg/dL

## 2016-04-18 MED ORDER — AMLODIPINE BESYLATE 5 MG PO TABS
5 | ORAL_TABLET | ORAL | 1 refills | Status: DC
Start: 2016-04-18 — End: 2016-10-30

## 2016-05-27 MED ORDER — HYDRALAZINE HCL 25 MG PO TABS
25 MG | ORAL_TABLET | ORAL | 0 refills | Status: DC
Start: 2016-05-27 — End: 2016-07-17

## 2016-07-15 MED ORDER — METOPROLOL SUCCINATE ER 50 MG PO TB24
50 MG | ORAL_TABLET | ORAL | 1 refills | Status: DC
Start: 2016-07-15 — End: 2016-07-17

## 2016-07-17 ENCOUNTER — Encounter

## 2016-07-17 ENCOUNTER — Ambulatory Visit
Admit: 2016-07-17 | Discharge: 2016-07-17 | Payer: BLUE CROSS/BLUE SHIELD | Attending: Internal Medicine | Primary: Internal Medicine

## 2016-07-17 DIAGNOSIS — I10 Essential (primary) hypertension: Secondary | ICD-10-CM

## 2016-07-17 NOTE — Progress Notes (Signed)
Subjective:      Patient ID: Phillip Harper is a 55 y.o. male.    Here today for follow up of chronic problems see problem list, no new c/o feels good other than he has been drinking again now in counseling and AA  To start Antibuse as noted       Hypertension   This is a chronic problem. The current episode started more than 1 year ago. The problem is unchanged. The problem is controlled. Associated symptoms include anxiety. Pertinent negatives include no chest pain, headaches, palpitations or shortness of breath. Agents associated with hypertension include NSAIDs. Risk factors for coronary artery disease include dyslipidemia, male gender and obesity. Past treatments include calcium channel blockers, central alpha agonists and lifestyle changes. The current treatment provides significant improvement. There are no compliance problems.    Hyperlipidemia   This is a chronic problem. The current episode started more than 1 year ago. The problem is controlled. Recent lipid tests were reviewed and are normal. 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 Pertinent negatives include no chest pain or shortness of breath. Current antihyperlipidemic treatment includes diet change and statins. The current treatment provides moderate improvement of lipids. There are no compliance problems.    Hyperglycemia   Associated symptoms include congestion. Pertinent negatives include no chest pain, headaches or vomiting.       Review of Systems   Constitutional: Positive for unexpected weight change (down a few #).   HENT: Positive for congestion and rhinorrhea.    Eyes: Negative.         Glasses   Respiratory: Negative.  Negative for choking, shortness of breath and wheezing.    Cardiovascular: Negative.  Negative for chest pain and palpitations.   Gastrointestinal: Positive for diarrhea. Negative for blood in stool and vomiting.   Endocrine: Negative.  Negative for polydipsia, polyphagia and polyuria.   Genitourinary:  Positive for frequency and urgency.        Nocturia    Musculoskeletal: Negative.         Gout is stable since taking allopurinol    Skin: Negative.         Acne keloidas nuchae back of neck as noted    Allergic/Immunologic: Negative.    Neurological: Negative.  Negative for headaches.   Hematological: Negative.    Psychiatric/Behavioral: Negative.        Objective:   Physical Exam   Constitutional: He is oriented to person, place, and time. He appears well-developed and well-nourished.   HENT:   Head: Normocephalic and atraumatic.   Nose: Nose normal.   Mouth/Throat: Oropharynx is clear and moist.   Eyes: Conjunctivae and EOM are normal.   Neck: Normal range of motion. Neck supple. No tracheal deviation present. No thyromegaly present.   Cardiovascular: Normal rate, regular rhythm, normal heart sounds and intact distal pulses.    No murmur heard.  Pulmonary/Chest: Effort normal and breath sounds normal.   Abdominal: Soft. Bowel sounds are normal.   obese   Genitourinary:   Genitourinary Comments: Per [name]   Musculoskeletal: Normal range of motion. He exhibits tenderness (low back ).   Neurological: He is alert and oriented to person, place, and time. He has normal reflexes.   Skin: Skin is warm and dry.   Acne keloidas nuchae back of neck as noted    Psychiatric: He has a normal mood and affect. His behavior is normal.       Assessment:  Plan:        Alcohol abuse  Drinking more now in counseling and AA they suggested taking Antibuse   Agree this is ok to take will check labs     Gout  No recent attacks     HTN (hypertension)  Stable no change in meds return in 3 mo. Labs pending needs to DC ETOH     Hyperglycemia  Diet controled     Hyperlipidemia  Stable continue current meds and return in 3 mo.    check labs     Prostate CA [facility])  No new symptoms check labs     Vitamin D deficiency  Continue current meds

## 2016-07-17 NOTE — Assessment & Plan Note (Signed)
No new symptoms check labs

## 2016-07-17 NOTE — Assessment & Plan Note (Signed)
Continue current meds

## 2016-07-17 NOTE — Assessment & Plan Note (Signed)
Stable continue current meds and return in 3 mo.    check labs

## 2016-07-17 NOTE — Assessment & Plan Note (Signed)
No recent attacks

## 2016-07-17 NOTE — Assessment & Plan Note (Signed)
Diet controled

## 2016-07-17 NOTE — Assessment & Plan Note (Signed)
Drinking more now in counseling and AA they suggested taking Antibuse   Agree this is ok to take will check labs

## 2016-07-17 NOTE — Assessment & Plan Note (Signed)
Stable no change in meds return in 3 mo. Labs pending needs to DC ETOH

## 2016-07-18 LAB — CBC WITH AUTO DIFFERENTIAL
Basophils %: 0.2 %
Basophils Absolute: 0 10*3/uL (ref 0.0–0.2)
Eosinophils %: 1.4 %
Eosinophils Absolute: 0.1 10*3/uL (ref 0.0–0.6)
Hematocrit: 41.8 % (ref 40.5–52.5)
Hemoglobin: 14.3 g/dL (ref 13.5–17.5)
Lymphocytes %: 23.1 %
Lymphocytes Absolute: 2.3 10*3/uL (ref 1.0–5.1)
MCH: 34.9 pg — ABNORMAL HIGH (ref 26.0–34.0)
MCHC: 34.3 g/dL (ref 31.0–36.0)
MCV: 101.8 fL — ABNORMAL HIGH (ref 80.0–100.0)
MPV: 8.9 fL (ref 5.0–10.5)
Monocytes %: 14.3 %
Monocytes Absolute: 1.4 10*3/uL — ABNORMAL HIGH (ref 0.0–1.3)
Neutrophils %: 61 %
Neutrophils Absolute: 6.1 10*3/uL (ref 1.7–7.7)
Platelets: 180 10*3/uL (ref 135–450)
RBC: 4.11 M/uL — ABNORMAL LOW (ref 4.20–5.90)
RDW: 14.1 % (ref 12.4–15.4)
WBC: 10 10*3/uL (ref 4.0–11.0)

## 2016-07-18 LAB — LIPID PANEL
Cholesterol, Total: 116 mg/dL (ref 0–199)
HDL: 32 mg/dL — ABNORMAL LOW (ref 40–60)
LDL Calculated: 62 mg/dL (ref <100)
Triglycerides: 109 mg/dL (ref 0–150)
VLDL Cholesterol Calculated: 22 mg/dL

## 2016-07-18 LAB — COMPREHENSIVE METABOLIC PANEL
ALT: 30 U/L (ref 10–40)
AST: 44 U/L — ABNORMAL HIGH (ref 15–37)
Albumin/Globulin Ratio: 1.1 (ref 1.1–2.2)
Albumin: 4.3 g/dL (ref 3.4–5.0)
Alkaline Phosphatase: 67 U/L (ref 40–129)
Anion Gap: 18 — ABNORMAL HIGH (ref 3–16)
BUN: 4 mg/dL — ABNORMAL LOW (ref 7–20)
CO2: 25 mmol/L (ref 21–32)
Calcium: 9.3 mg/dL (ref 8.3–10.6)
Chloride: 97 mmol/L — ABNORMAL LOW (ref 99–110)
Creatinine: 0.8 mg/dL — ABNORMAL LOW (ref 0.9–1.3)
GFR African American: >60 (ref >60)
GFR Non-African American: >60 (ref >60)
Globulin: 3.8 g/dL
Glucose: 111 mg/dL — ABNORMAL HIGH (ref 70–99)
Potassium: 3.2 mmol/L — ABNORMAL LOW (ref 3.5–5.1)
Sodium: 140 mmol/L (ref 136–145)
Total Bilirubin: 0.8 mg/dL (ref 0.0–1.0)
Total Protein: 8.1 g/dL (ref 6.4–8.2)

## 2016-07-18 LAB — TSH WITH REFLEX: TSH: 2.95 u[IU]/mL (ref 0.27–4.20)

## 2016-07-18 LAB — PSA PROSTATIC SPECIFIC ANTIGEN: PSA: <0.01 ng/mL (ref 0.00–4.00)

## 2016-07-18 LAB — HEMOGLOBIN A1C
Hemoglobin A1C: 6.2 %
eAG: 131.2 mg/dL

## 2016-07-19 LAB — VITAMIN D 1,25 DIHYDROXY: Vit D, 1,25-Dihydroxy: 44.8 pg/mL (ref 19.9–79.3)

## 2016-07-30 MED ORDER — SIMVASTATIN 40 MG PO TABS
40 MG | ORAL_TABLET | ORAL | 0 refills | Status: DC
Start: 2016-07-30 — End: 2016-10-27

## 2016-08-26 MED ORDER — ALLOPURINOL 300 MG PO TABS
300 MG | ORAL_TABLET | ORAL | 0 refills | Status: DC
Start: 2016-08-26 — End: 2016-11-24

## 2016-08-26 MED ORDER — HYDRALAZINE HCL 25 MG PO TABS
25 MG | ORAL_TABLET | ORAL | 0 refills | Status: DC
Start: 2016-08-26 — End: 2016-11-18

## 2016-08-29 MED ORDER — MELOXICAM 7.5 MG PO TABS
7.5 MG | ORAL_TABLET | ORAL | 1 refills | Status: DC
Start: 2016-08-29 — End: 2017-02-25

## 2016-10-15 ENCOUNTER — Encounter: Attending: Internal Medicine | Primary: Internal Medicine

## 2016-10-28 MED ORDER — SIMVASTATIN 40 MG PO TABS
40 MG | ORAL_TABLET | ORAL | 0 refills | Status: DC
Start: 2016-10-28 — End: 2017-01-30

## 2016-10-30 MED ORDER — AMLODIPINE BESYLATE 5 MG PO TABS
5 MG | ORAL_TABLET | ORAL | 0 refills | Status: DC
Start: 2016-10-30 — End: 2017-02-01

## 2016-11-18 ENCOUNTER — Ambulatory Visit
Admit: 2016-11-18 | Discharge: 2016-11-18 | Payer: BLUE CROSS/BLUE SHIELD | Attending: Internal Medicine | Primary: Internal Medicine

## 2016-11-18 ENCOUNTER — Encounter

## 2016-11-18 DIAGNOSIS — I10 Essential (primary) hypertension: Secondary | ICD-10-CM

## 2016-11-18 NOTE — Assessment & Plan Note (Signed)
Stable no change in meds return in 3 mo. Labs pending

## 2016-11-18 NOTE — Assessment & Plan Note (Signed)
No new symptoms will check labs

## 2016-11-18 NOTE — Assessment & Plan Note (Signed)
Last ETOH  05/2016 cont with AA

## 2016-11-18 NOTE — Assessment & Plan Note (Signed)
Diet controled check labs

## 2016-11-18 NOTE — Progress Notes (Signed)
Subjective:      Patient ID: Phillip Harper is a 56 y.o. male.    HPI  Here today for follow up of chronic problems as per HPI and as problems listed under assessment and plan,no new c/o feels good no ETOH x 6 mo feels good cont with AA as noted       Hyperlipidemia   This is a chronic problem. The current episode started more than 1 year ago. The problem is controlled. Recent lipid tests were reviewed and are normal. Factors aggravating his hyperlipidemia include beta blockers. Pertinent negatives include no chest pain or shortness of breath. Current antihyperlipidemic treatment includes diet change, exercise and statins. The current treatment provides significant improvement of lipids. There are no compliance problems.  Risk factors for coronary artery disease include dyslipidemia, hypertension, male sex and obesity.   Hypertension   This is a chronic problem. The current episode started more than 1 year ago. The problem is unchanged. The problem is controlled. Pertinent negatives include no chest pain, headaches, palpitations or shortness of breath. There are no associated agents to hypertension. Risk factors for coronary artery disease include dyslipidemia, obesity and male gender. Past treatments include calcium channel blockers and beta blockers. There are no compliance problems.        Current Outpatient Prescriptions   Medication Sig Dispense Refill   ??? indomethacin (INDOCIN) 25 MG capsule Take 25 mg by mouth as needed for Pain     ??? amLODIPine (NORVASC) 5 MG tablet TAKE 1 TABLET BY MOUTH DAILY 90 tablet 0   ??? simvastatin (ZOCOR) 40 MG tablet TAKE 1 TABLET BY MOUTH AT BEDTIME 90 tablet 0   ??? meloxicam (MOBIC) 7.5 MG tablet TAKE 1 TABLET BY MOUTH DAILY 90 tablet 1   ??? allopurinol (ZYLOPRIM) 300 MG tablet TAKE 1 TABLET BY MOUTH EVERY DAY 90 tablet 0   ??? hydrALAZINE (APRESOLINE) 25 MG tablet TAKE 1 TABLET BY MOUTH EVERY 8 HOURS 270 tablet 0   ??? albuterol-ipratropium (COMBIVENT RESPIMAT) 20-100 MCG/ACT AERS  inhaler Inhale 1 puff into the lungs every 6 hours 1 Inhaler 0   ??? metoprolol succinate (TOPROL XL) 50 MG extended release tablet TAKE 1 TABLET BY MOUTH EVERY NIGHT 90 tablet 0   ??? colchicine (COLCRYS) 0.6 MG tablet 1 po prn for gout attack may repeat in 2 hours no more than 2/24 hrs 10 tablet 1   ??? docusate sodium (COLACE) 100 MG capsule Take 1 capsule by mouth 2 times daily as needed for Constipation 24 capsule 0   ??? Vitamin D (CHOLECALCIFEROL) 1000 UNITS CAPS capsule Take 2,000 Units by mouth daily.       No current facility-administered medications for this visit.        Past Medical History:   Diagnosis Date   ??? Cancer [facility]) 2016    Prostate [name]   ??? Clostridium difficile infection 08/01/2010   ??? Gout    ??? HTN (hypertension)    ??? Hyperlipidemia    ??? Keloids 12/2003    multiple keloids on head   ??? Pancreatitis, alcoholic 13/24/40-05/31/24   ??? Vitamin D deficiency        Family History   Problem Relation Age of Onset   ??? Cancer Mother      lung CA   ??? Heart Disease Father      CAD   ??? Cancer Sister      lung CA       Past Surgical History:  Procedure Laterality Date   ??? COLONOSCOPY  02/04/13    Dr Fredonia Highland - normal repeat 2024   ??? OTHER SURGICAL HISTORY      KELOID SURGERY   ??? PROSTATE BIOPSY     ??? PROSTATECTOMY Bilateral 12/20/14    robotic assisted laparoscopic       Social History     Social History   ??? Marital status: Legally Separated     Spouse name: N/A   ??? Number of children: N/A   ??? Years of education: N/A     Occupational History   ??? Not on file.     Social History Main Topics   ??? Smoking status: Never Smoker   ??? Smokeless tobacco: Never Used   ??? Alcohol use No      Comment: has  not been drinking now in counsling and AA etc    ??? Drug use: No   ??? Sexual activity: Yes     Partners: Female     Other Topics Concern   ??? Not on file     Social History Narrative   ??? No narrative on file       Review of Systems  Review of Systems   Constitutional: Positive for unexpected weight change (down a few #).   HENT:  Positive for congestion and rhinorrhea.    Eyes: Negative.         Glasses   Respiratory: Negative.  Negative for choking, shortness of breath and wheezing.    Cardiovascular: Negative.  Negative for chest pain and palpitations.   Gastrointestinal: Negative for blood in stool and vomiting. Diarrhea: occ .sx    Endocrine: Negative.  Negative for polydipsia, polyphagia and polyuria.   Genitourinary: Positive for frequency and urgency.        Nocturia -s/p prostate Ca check labs    Musculoskeletal: Negative.         Gout is stable since taking allopurinol    Skin: Negative.         Acne keloidas nuchae back of neck as noted    Allergic/Immunologic: Negative.    Neurological: Negative.  Negative for headaches.   Hematological: Negative.    Psychiatric/Behavioral: Negative.        Objective:   Physical Exam:  Physical Exam   Constitutional: He is oriented to person, place, and time. He appears well-developed and well-nourished.   HENT:   Head: Normocephalic and atraumatic.   Nose: Nose normal.   Mouth/Throat: Oropharynx is clear and moist.   Eyes: Conjunctivae and EOM are normal.   Neck: Normal range of motion. Neck supple. No tracheal deviation present. No thyromegaly present.   Cardiovascular: Normal rate, regular rhythm, normal heart sounds and intact distal pulses.    No murmur heard.  Pulmonary/Chest: Effort normal and breath sounds normal.   Abdominal: Soft. Bowel sounds are normal.   obese   Genitourinary:   Genitourinary Comments: Per [name]   Musculoskeletal: Normal range of motion. Tenderness: low back improved    Neurological: He is alert and oriented to person, place, and time. He has normal reflexes.   Skin: Skin is warm and dry.   Acne keloidas nuchae back of neck as noted    Psychiatric: He has a normal mood and affect. His behavior is normal.       Assessment & Plan:          Gout  No recent symptoms cont diet and meds labs pending     Hyperlipidemia  Stable no change in meds return in 3 mo. Labs  pending     HTN (hypertension)  Stable no change in meds return in 3 mo. Labs pending     Vitamin D deficiency  Stable no change in meds return in 3 mo. Labs pending     Alcohol abuse    Last ETOH  05/2016 cont with AA     Prostate CA ([place])  No new symptoms will check labs     Hyperglycemia  Diet controled check labs

## 2016-11-18 NOTE — Assessment & Plan Note (Signed)
No recent symptoms cont diet and meds labs pending

## 2016-11-19 LAB — CBC WITH AUTO DIFFERENTIAL
Basophils %: 0.2 %
Basophils Absolute: 0 10*3/uL (ref 0.0–0.2)
Eosinophils %: 5.9 %
Eosinophils Absolute: 0.3 10*3/uL (ref 0.0–0.6)
Hematocrit: 42.7 % (ref 40.5–52.5)
Hemoglobin: 14.4 g/dL (ref 13.5–17.5)
Lymphocytes %: 44.1 %
Lymphocytes Absolute: 2 10*3/uL (ref 1.0–5.1)
MCH: 35 pg — ABNORMAL HIGH (ref 26.0–34.0)
MCHC: 33.7 g/dL (ref 31.0–36.0)
MCV: 103.9 fL — ABNORMAL HIGH (ref 80.0–100.0)
MPV: 8 fL (ref 5.0–10.5)
Monocytes %: 8.5 %
Monocytes Absolute: 0.4 10*3/uL (ref 0.0–1.3)
Neutrophils %: 41.3 %
Neutrophils Absolute: 1.8 10*3/uL (ref 1.7–7.7)
Platelets: 202 10*3/uL (ref 135–450)
RBC: 4.11 M/uL — ABNORMAL LOW (ref 4.20–5.90)
RDW: 15.2 % (ref 12.4–15.4)
WBC: 4.5 10*3/uL (ref 4.0–11.0)

## 2016-11-19 LAB — COMPREHENSIVE METABOLIC PANEL
ALT: 35 U/L (ref 10–40)
AST: 55 U/L — ABNORMAL HIGH (ref 15–37)
Albumin/Globulin Ratio: 1.1 (ref 1.1–2.2)
Albumin: 4.1 g/dL (ref 3.4–5.0)
Alkaline Phosphatase: 75 U/L (ref 40–129)
Anion Gap: 17 — ABNORMAL HIGH (ref 3–16)
BUN: 3 mg/dL — ABNORMAL LOW (ref 7–20)
CO2: 25 mmol/L (ref 21–32)
Calcium: 8.6 mg/dL (ref 8.3–10.6)
Chloride: 107 mmol/L (ref 99–110)
Creatinine: 0.6 mg/dL — ABNORMAL LOW (ref 0.9–1.3)
GFR African American: >60 (ref >60)
GFR Non-African American: >60 (ref >60)
Globulin: 3.8 g/dL
Glucose: 108 mg/dL — ABNORMAL HIGH (ref 70–99)
Potassium: 4.2 mmol/L (ref 3.5–5.1)
Sodium: 149 mmol/L — ABNORMAL HIGH (ref 136–145)
Total Bilirubin: 0.3 mg/dL (ref 0.0–1.0)
Total Protein: 7.9 g/dL (ref 6.4–8.2)

## 2016-11-19 LAB — LIPID PANEL
Cholesterol, Total: 124 mg/dL (ref 0–199)
HDL: 53 mg/dL (ref 40–60)
LDL Calculated: 45 mg/dL (ref <100)
Triglycerides: 129 mg/dL (ref 0–150)
VLDL Cholesterol Calculated: 26 mg/dL

## 2016-11-19 LAB — TSH WITH REFLEX: TSH: 1.51 u[IU]/mL (ref 0.27–4.20)

## 2016-11-19 LAB — PSA PROSTATIC SPECIFIC ANTIGEN: PSA: <0.01 ng/mL (ref 0.00–4.00)

## 2016-11-19 LAB — HEMOGLOBIN A1C
Hemoglobin A1C: 6.1 %
eAG: 128.4 mg/dL

## 2016-11-19 LAB — URIC ACID: Uric Acid, Serum: 5.1 mg/dL (ref 3.5–7.2)

## 2016-11-25 MED ORDER — HYDRALAZINE HCL 25 MG PO TABS
25 MG | ORAL_TABLET | ORAL | 1 refills | Status: DC
Start: 2016-11-25 — End: 2017-02-21

## 2016-11-25 MED ORDER — ALLOPURINOL 300 MG PO TABS
300 MG | ORAL_TABLET | ORAL | 1 refills | Status: DC
Start: 2016-11-25 — End: 2017-05-23

## 2017-01-30 MED ORDER — SIMVASTATIN 40 MG PO TABS
40 MG | ORAL_TABLET | ORAL | 0 refills | Status: DC
Start: 2017-01-30 — End: 2017-05-04

## 2017-02-03 MED ORDER — AMLODIPINE BESYLATE 5 MG PO TABS
5 MG | ORAL_TABLET | ORAL | 0 refills | Status: DC
Start: 2017-02-03 — End: 2017-05-04

## 2017-02-17 ENCOUNTER — Encounter: Attending: Internal Medicine | Primary: Internal Medicine

## 2017-02-21 ENCOUNTER — Encounter

## 2017-02-21 ENCOUNTER — Ambulatory Visit
Admit: 2017-02-21 | Discharge: 2017-02-21 | Payer: BLUE CROSS/BLUE SHIELD | Attending: Internal Medicine | Primary: Internal Medicine

## 2017-02-21 DIAGNOSIS — I10 Essential (primary) hypertension: Secondary | ICD-10-CM

## 2017-02-21 NOTE — Assessment & Plan Note (Signed)
occ incontinence

## 2017-02-21 NOTE — Assessment & Plan Note (Signed)
Stable continue current meds and return in 3 mo.

## 2017-02-21 NOTE — Assessment & Plan Note (Signed)
Diet controled

## 2017-02-21 NOTE — Assessment & Plan Note (Signed)
Avoid milk and fatty food- check again for C-diff  Prn Imodium consider referal again to Dr Fredonia Highland

## 2017-02-21 NOTE — Assessment & Plan Note (Signed)
No recent symptoms

## 2017-02-21 NOTE — Progress Notes (Signed)
Subjective:      Patient ID: Phillip Harper is a 55 y.o. male.    HPI  Here today for follow up of chronic problems as per HPI and as problems listed under assessment and plan,no new c/o feels good cont to not drink - diet contyroled hyperglycemia as noted       Hyperlipidemia   This is a chronic problem. The current episode started more than 1 year ago. The problem is controlled. Recent lipid tests were reviewed and are normal. There are no known factors aggravating his hyperlipidemia. Pertinent negatives include no chest pain or shortness of breath. Current antihyperlipidemic treatment includes diet change, exercise and statins. The current treatment provides significant improvement of lipids. There are no compliance problems.  Risk factors for coronary artery disease include dyslipidemia, hypertension, male sex and obesity.   Hypertension   This is a chronic problem. The current episode started more than 1 year ago. The problem is unchanged. The problem is controlled. Pertinent negatives include no chest pain, headaches, palpitations or shortness of breath. There are no associated agents to hypertension. Risk factors for coronary artery disease include dyslipidemia, obesity and male gender. Past treatments include calcium channel blockers and central alpha agonists. The current treatment provides significant improvement. There are no compliance problems.        Allergies   Allergen Reactions   . Shellfish-Derived Products Swelling       Current Outpatient Prescriptions   Medication Sig Dispense Refill   . Loperamide HCl (IMODIUM A-D PO) Take by mouth     . amLODIPine (NORVASC) 5 MG tablet TAKE 1 TABLET BY MOUTH DAILY 90 tablet 0   . simvastatin (ZOCOR) 40 MG tablet TAKE 1 TABLET BY MOUTH AT BEDTIME 90 tablet 0   . allopurinol (ZYLOPRIM) 300 MG tablet TAKE 1 TABLET BY MOUTH EVERY DAY 90 tablet 1   . indomethacin (INDOCIN) 25 MG capsule Take 25 mg by mouth as needed for Pain     . meloxicam (MOBIC) 7.5 MG tablet  TAKE 1 TABLET BY MOUTH DAILY 90 tablet 1   . hydrALAZINE (APRESOLINE) 25 MG tablet TAKE 1 TABLET BY MOUTH EVERY 8 HOURS 270 tablet 0   . albuterol-ipratropium (COMBIVENT RESPIMAT) 20-100 MCG/ACT AERS inhaler Inhale 1 puff into the lungs every 6 hours 1 Inhaler 0   . metoprolol succinate (TOPROL XL) 50 MG extended release tablet TAKE 1 TABLET BY MOUTH EVERY NIGHT 90 tablet 0   . colchicine (COLCRYS) 0.6 MG tablet 1 po prn for gout attack may repeat in 2 hours no more than 2/24 hrs 10 tablet 1   . docusate sodium (COLACE) 100 MG capsule Take 1 capsule by mouth 2 times daily as needed for Constipation 24 capsule 0   . Vitamin D (CHOLECALCIFEROL) 1000 UNITS CAPS capsule Take 2,000 Units by mouth daily.       No current facility-administered medications for this visit.        Past Medical History:   Diagnosis Date   . Cancer [facility]) 2016    Prostate [name]   . Clostridium difficile infection 08/01/2010   . Gout    . HTN (hypertension)    . Hyperlipidemia    . Keloids 12/2003    multiple keloids on head   . Pancreatitis, alcoholic 16/10/96-04/54/09   . Vitamin D deficiency        Family History   Problem Relation Age of Onset   . Cancer Mother  lung CA   . Heart Disease Father         CAD   . Cancer Sister         lung CA       Past Surgical History:   Procedure Laterality Date   . COLONOSCOPY  02/04/13    Dr Fredonia Highland - normal repeat 2024   . OTHER SURGICAL HISTORY      KELOID SURGERY   . PROSTATE BIOPSY     . PROSTATECTOMY Bilateral 12/20/14    robotic assisted laparoscopic       Social History     Social History   . Marital status: Legally Separated     Spouse name: N/A   . Number of children: N/A   . Years of education: N/A     Occupational History   . Not on file.     Social History Main Topics   . Smoking status: Never Smoker   . Smokeless tobacco: Never Used   . Alcohol use No      Comment: has  not been drinking now in counsling and AA etc    . Drug use: No   . Sexual activity: Yes     Partners: Female     Other  Topics Concern   . Not on file     Social History Narrative   . No narrative on file       Review of Systems  Review of Systems   Constitutional: Positive for unexpected weight change (down a few #).   HENT: Positive for congestion and rhinorrhea.    Eyes: Negative.         Glasses   Respiratory: Negative.  Negative for choking, shortness of breath and wheezing.    Cardiovascular: Negative.  Negative for chest pain and palpitations.   Gastrointestinal: Negative for blood in stool and vomiting. Diarrhea: occ .sx    Endocrine: Negative.  Negative for polydipsia, polyphagia and polyuria.   Genitourinary: Positive for frequency and urgency.        Nocturia -s/p prostate Ca check labs    Musculoskeletal: Negative.         Gout is stable since taking allopurinol    Skin: Negative.         Acne keloidas nuchae back of neck as noted    Allergic/Immunologic: Negative.    Neurological: Negative.  Negative for headaches.   Hematological: Negative.    Psychiatric/Behavioral: Negative.        Objective:   Physical Exam:  Physical Exam   Constitutional: He is oriented to person, place, and time. He appears well-developed and well-nourished.   HENT:   Head: Normocephalic and atraumatic.   Nose: Nose normal.   Mouth/Throat: Oropharynx is clear and moist.   Eyes: Conjunctivae and EOM are normal.   Neck: Normal range of motion. Neck supple. No tracheal deviation present. No thyromegaly present.   Cardiovascular: Normal rate, regular rhythm, normal heart sounds and intact distal pulses.    No murmur heard.  Pulmonary/Chest: Effort normal and breath sounds normal.   Abdominal: Soft. Bowel sounds are normal.   obese   Genitourinary:   Genitourinary Comments: Per [name]   Musculoskeletal: Normal range of motion. Tenderness: low back improved    Neurological: He is alert and oriented to person, place, and time. He has normal reflexes.   Skin: Skin is warm and dry.   Acne keloidas nuchae back of neck as noted    Psychiatric: He has a  normal mood and affect. His behavior is normal.       BP 124/60 (Site: Right Arm, Position: Sitting, Cuff Size: Medium Adult)   Pulse 62   Resp 16   Ht 5\' 9"  (1.753 m)   Wt 244 lb 3.2 oz (110.8 kg)   BMI 36.06 kg/m       Assessment & Plan:          Alcohol abuse  Still not drinking     Gout  No recent symptoms     HTN (hypertension)  Stable continue current meds and return in 3 mo.        Hyperglycemia  Diet controled     Hyperlipidemia  Stable continue current meds and return in 3 mo.        Prostate CA ([place])  occ incontinence     Diarrhea  Avoid milk and fatty food- check again for C-diff  Prn Imodium consider referal again to Dr Fredonia Highland

## 2017-02-21 NOTE — Assessment & Plan Note (Signed)
Still not drinking

## 2017-02-22 LAB — CBC WITH AUTO DIFFERENTIAL
Basophils %: 0.2 %
Basophils Absolute: 0 10*3/uL (ref 0.0–0.2)
Eosinophils %: 3.3 %
Eosinophils Absolute: 0.3 10*3/uL (ref 0.0–0.6)
Hematocrit: 37.3 % — ABNORMAL LOW (ref 40.5–52.5)
Hemoglobin: 12.9 g/dL — ABNORMAL LOW (ref 13.5–17.5)
Lymphocytes %: 33.1 %
Lymphocytes Absolute: 2.5 10*3/uL (ref 1.0–5.1)
MCH: 36.8 pg — ABNORMAL HIGH (ref 26.0–34.0)
MCHC: 34.5 g/dL (ref 31.0–36.0)
MCV: 106.6 fL — ABNORMAL HIGH (ref 80.0–100.0)
MPV: 7.8 fL (ref 5.0–10.5)
Monocytes %: 9.5 %
Monocytes Absolute: 0.7 10*3/uL (ref 0.0–1.3)
Neutrophils %: 53.9 %
Neutrophils Absolute: 4.1 10*3/uL (ref 1.7–7.7)
Platelets: 218 10*3/uL (ref 135–450)
RBC: 3.5 M/uL — ABNORMAL LOW (ref 4.20–5.90)
RDW: 15 % (ref 12.4–15.4)
WBC: 7.6 10*3/uL (ref 4.0–11.0)

## 2017-02-22 LAB — COMPREHENSIVE METABOLIC PANEL
ALT: 25 U/L (ref 10–40)
AST: 51 U/L — ABNORMAL HIGH (ref 15–37)
Albumin/Globulin Ratio: 1.1 (ref 1.1–2.2)
Albumin: 4.1 g/dL (ref 3.4–5.0)
Alkaline Phosphatase: 79 U/L (ref 40–129)
Anion Gap: 20 — ABNORMAL HIGH (ref 3–16)
BUN: 2 mg/dL — ABNORMAL LOW (ref 7–20)
CO2: 23 mmol/L (ref 21–32)
Calcium: 8.7 mg/dL (ref 8.3–10.6)
Chloride: 97 mmol/L — ABNORMAL LOW (ref 99–110)
Creatinine: 0.6 mg/dL — ABNORMAL LOW (ref 0.9–1.3)
GFR African American: >60 (ref >60)
GFR Non-African American: >60 (ref >60)
Globulin: 3.8 g/dL
Glucose: 100 mg/dL — ABNORMAL HIGH (ref 70–99)
Potassium: 3.2 mmol/L — ABNORMAL LOW (ref 3.5–5.1)
Sodium: 140 mmol/L (ref 136–145)
Total Bilirubin: 0.4 mg/dL (ref 0.0–1.0)
Total Protein: 7.9 g/dL (ref 6.4–8.2)

## 2017-02-22 LAB — URIC ACID: Uric Acid, Serum: 6.2 mg/dL (ref 3.5–7.2)

## 2017-02-22 LAB — HEMOGLOBIN A1C
Hemoglobin A1C: 5.8 %
eAG: 119.8 mg/dL

## 2017-02-22 LAB — LIPID PANEL
Cholesterol, Total: 126 mg/dL (ref 0–199)
HDL: 71 mg/dL — ABNORMAL HIGH (ref 40–60)
LDL Calculated: 42 mg/dL (ref <100)
Triglycerides: 63 mg/dL (ref 0–150)
VLDL Cholesterol Calculated: 13 mg/dL

## 2017-02-22 LAB — TSH WITH REFLEX: TSH: 1.54 u[IU]/mL (ref 0.27–4.20)

## 2017-02-22 LAB — PSA PROSTATIC SPECIFIC ANTIGEN: PSA: <0.01 ng/mL (ref 0.00–4.00)

## 2017-02-22 LAB — C DIFF TOXIN/ANTIGEN: C difficile Toxin, EIA: NEGATIVE

## 2017-02-23 LAB — VITAMIN D 1,25 DIHYDROXY: Vit D, 1,25-Dihydroxy: 44.1 pg/mL (ref 19.9–79.3)

## 2017-02-25 MED ORDER — MELOXICAM 7.5 MG PO TABS
7.5 MG | ORAL_TABLET | ORAL | 0 refills | Status: DC
Start: 2017-02-25 — End: 2017-05-24

## 2017-03-03 NOTE — Telephone Encounter (Signed)
Results discussed with patient. Stool results negative for C-diff, suggested patient increase his bananas and oranges due to potassium being low. Patient verbalized understanding.

## 2017-04-16 MED ORDER — METOPROLOL SUCCINATE ER 50 MG PO TB24
50 MG | ORAL_TABLET | ORAL | 0 refills | Status: DC
Start: 2017-04-16 — End: 2017-08-26

## 2017-04-17 DIAGNOSIS — F10920 Alcohol use, unspecified with intoxication, uncomplicated: Secondary | ICD-10-CM

## 2017-04-17 NOTE — ED Provider Notes (Signed)
[place] ED  eMERGENCY dEPARTMENT eNCOUnter        Pt Name: Phillip Harper  MRN: [id number]  [place] October 21, 1960  Date of evaluation: 04/17/2017  Provider: Rema Fendt, APRN - CNP  PCP: Ignacia Felling, MD  ED Attending: No att. providers found    [place]       Chief Complaint   Patient presents with   ??? Fall     Pt brought in by [facility]. Pt was at game with wife. Pt became intoxicated and started falling. Wife brought pt home. PT fell at home also. Pt having hip pain. Pt sleeping during triage.        HISTORY OF PRESENT ILLNESS   (Location/Symptom, Timing/Onset, Context/Setting, Quality, Duration, Modifying Factors, Severity)  Note limiting factors.     Phillip Harper is a 56 y.o. male presents to the ER intoxicated with concern of fall and inability to get up.  States that he has had quite a bit of alcohol to drink tonight and today.  He fell while at the [employer].      Patient reports pain to the right knee.  Abrasion noted, no obvious deformity.    Denies any fever, lightheadedness, dizziness, visual disturbances.  No chest pain or pressure.  No neck or back pain.  No shortness of breath, cough, or congestion.  No abdominal pain, nausea, vomiting, diarrhea, constipation, or dysuria.  No rash.    Nursing Notes were all reviewed and agreed with or any disagreements were addressed  in the HPI.    REVIEW OF SYSTEMS    (2-9 systems for level 4, 10 or more for level 5)     Review of Systems   Constitutional: Negative for activity change, chills and fever.   Respiratory: Negative for chest tightness and shortness of breath.    Cardiovascular: Negative for chest pain.   Gastrointestinal: Negative for abdominal pain, diarrhea, nausea and vomiting.   Genitourinary: Negative for dysuria.   Musculoskeletal:        Right knee abrasion   Neurological:        Somnolent     All other systems reviewed and are negative.      Positives and Pertinent negatives as per HPI.   Except as noted above in the ROS, all other systems were reviewed and negative.       PAST MEDICAL HISTORY     Past Medical History:   Diagnosis Date   ??? Cancer [facility]) 2016    Prostate [name]   ??? Clostridium difficile infection 08/01/2010   ??? Gout    ??? HTN (hypertension)    ??? Hyperlipidemia    ??? Keloids 12/2003    multiple keloids on head   ??? Pancreatitis, alcoholic 33/29/51-88/41/66   ??? Vitamin D deficiency          SURGICAL HISTORY       Past Surgical History:   Procedure Laterality Date   ??? COLONOSCOPY  02/04/13    Dr Fredonia Highland - normal repeat 2024   ??? OTHER SURGICAL HISTORY      KELOID SURGERY   ??? PROSTATE BIOPSY     ??? PROSTATECTOMY Bilateral 12/20/14    robotic assisted laparoscopic         CURRENT MEDICATIONS       Previous Medications    ALBUTEROL-IPRATROPIUM (COMBIVENT RESPIMAT) 20-100 MCG/ACT AERS INHALER    Inhale 1 puff into the lungs every 6 hours    ALLOPURINOL (ZYLOPRIM)  300 MG TABLET    TAKE 1 TABLET BY MOUTH EVERY DAY    AMLODIPINE (NORVASC) 5 MG TABLET    TAKE 1 TABLET BY MOUTH DAILY    COLCHICINE (COLCRYS) 0.6 MG TABLET    1 po prn for gout attack may repeat in 2 hours no more than 2/24 hrs    DOCUSATE SODIUM (COLACE) 100 MG CAPSULE    Take 1 capsule by mouth 2 times daily as needed for Constipation    HYDRALAZINE (APRESOLINE) 25 MG TABLET    TAKE 1 TABLET BY MOUTH EVERY 8 HOURS    INDOMETHACIN (INDOCIN) 25 MG CAPSULE    Take 25 mg by mouth as needed for Pain    LOPERAMIDE HCL (IMODIUM A-D PO)    Take by mouth    MELOXICAM (MOBIC) 7.5 MG TABLET    TAKE 1 TABLET BY MOUTH DAILY.    METOPROLOL SUCCINATE (TOPROL XL) 50 MG EXTENDED RELEASE TABLET    TAKE 1 TABLET BY MOUTH EVERY NIGHT    METOPROLOL SUCCINATE (TOPROL XL) 50 MG EXTENDED RELEASE TABLET    TAKE 1 TABLET BY MOUTH EVERY NIGHT    SIMVASTATIN (ZOCOR) 40 MG TABLET    TAKE 1 TABLET BY MOUTH AT BEDTIME    VITAMIN D (CHOLECALCIFEROL) 1000 UNITS CAPS CAPSULE    Take 2,000 Units by mouth daily.         ALLERGIES     Shellfish-derived products    FAMILY  HISTORY       Family History   Problem Relation Age of Onset   ??? Cancer Mother         lung CA   ??? Heart Disease Father         CAD   ??? Cancer Sister         lung CA          SOCIAL HISTORY       Social History     Social History   ??? Marital status: Legally Separated     Spouse name: N/A   ??? Number of children: N/A   ??? Years of education: N/A     Social History Main Topics   ??? Smoking status: Never Smoker   ??? Smokeless tobacco: Never Used   ??? Alcohol use No      Comment: has  not been drinking now in counsling and AA etc    ??? Drug use: No   ??? Sexual activity: Yes     Partners: Female     Other Topics Concern   ??? None     Social History Narrative   ??? None       SCREENINGS             PHYSICAL EXAM    (up to 7 for level 4, 8 or more for level 5)     ED Triage Vitals [04/17/17 2339]   BP Temp Temp Source Pulse Resp SpO2 Height Weight   (!) 125/91 97.8 ??F (36.6 ??C) Oral 99 16 99 % 5\' 9"  (1.753 m) 244 lb (110.7 kg)       Physical Exam   Constitutional: He is oriented to person, place, and time. He appears well-developed and well-nourished. No distress.   HENT:   Head: Normocephalic and atraumatic.   Right Ear: External ear normal.   Left Ear: External ear normal.   Eyes: Right eye exhibits no discharge. Left eye exhibits no discharge.   Neck: Normal range of motion. Neck supple.  No JVD present.   Cardiovascular: Normal rate, regular rhythm and normal heart sounds.    No murmur heard.  Pulmonary/Chest: Effort normal and breath sounds normal. No respiratory distress. He has no wheezes. He has no rales.   Abdominal: Soft. There is no tenderness. There is no guarding.   Musculoskeletal: Normal range of motion.   Neurological: He is alert and oriented to person, place, and time. He has normal strength. No cranial nerve deficit or sensory deficit. Coordination normal. GCS eye subscore is 4. GCS verbal subscore is 5. GCS motor subscore is 6.   Skin: Skin is warm and dry. He is not diaphoretic. No pallor.   Psychiatric: He has a  normal mood and affect. His behavior is normal.   Nursing note and vitals reviewed.      DIAGNOSTIC RESULTS   LABS:    Labs Reviewed   CBC WITH AUTO DIFFERENTIAL - Abnormal; Notable for the following:        Result Value    RBC 3.76 (*)     Hematocrit 39.2 (*)     MCV 104.4 (*)     MCH 35.9 (*)     All other components within normal limits    Narrative:     Performed at:  [facility]  [address],  [place] [address]   Phone [phone]   COMPREHENSIVE METABOLIC PANEL - Abnormal; Notable for the following:     Potassium 3.2 (*)     Glucose 115 (*)     BUN 3 (*)     CREATININE 0.6 (*)     Total Protein 8.4 (*)     Albumin/Globulin Ratio 0.8 (*)     AST 82 (*)     All other components within normal limits    Narrative:     Performed at:  [facility]  [address]  [place] [address]   Phone [phone]   ETHANOL    Narrative:     Performed at:  [facility]  [address],  [place] [address]   Phone [phone]   URINE DRUG SCREEN    Narrative:     Performed at:  [facility]  [address],  [place] [address]   Phone [phone]       All other labs were within normal range or not returned as of this dictation.    EKG: All EKG's are interpreted by the Emergency Department Physician who either signs or Co-signs this chart in the absence of a cardiologist.  Please see their note for interpretation of EKG.      RADIOLOGY:   Non-plain film images such as CT, Ultrasound and MRI are read by the radiologist. Plain radiographic images are visualized and preliminarily interpreted by the  ED Provider with the below findings:        Interpretation per the Radiologist below, if available at the time of this note:    XR KNEE RIGHT (1-2 VIEWS)   Final Result   No fracture or malalignment.      Mild osteoarthrosis of the knee.         CT Cervical Spine WO Contrast   Final Result    No acute abnormality of the cervical spine.         CT Head WO Contrast   Final Result   Mild bifrontal scalp stranding and/or  swelling.  No underlying displaced   calvarial fracture or acute intracranial abnormality.           No results found.      PROCEDURES   Unless otherwise noted below, none     Procedures    CRITICAL CARE TIME   N/A    CONSULTS:  None      EMERGENCY DEPARTMENT COURSE and DIFFERENTIAL DIAGNOSIS/MDM:   Vitals:    Vitals:    04/17/17 2339   BP: (!) 125/91   Pulse: 99   Resp: 16   Temp: 97.8 ??F (36.6 ??C)   TempSrc: Oral   SpO2: 99%   Weight: 244 lb (110.7 kg)   Height: 5\' 9"  (1.753 m)       Patient was given the following medications:  Medications - No data to display    Briefly, this is a 56 year old male that arrives this evening intoxicated.  Reports that he did fall down a bit [employer].  He was brought in per EMS for intoxication.    CBC shows no leukocytosis.  Hemoglobin normal.  Platelets 186.  CMP unremarkable.  Ethanol level 350.  Drug screen negative.    X-ray right knee shows no fracture or malalignment.  Mild osteoarthritis of the right knee.    CT cervical spine without contrast:  Impression:    No acute abnormality of the cervical spine.        CT head without contrast:  Impression:    Mild bifrontal scalp stranding and/or swelling. ??No underlying displaced  calvarial fracture or acute intracranial abnormality.        Patient will be observed in the ED until he is sober and able to be discharged.    The patient tolerated their visit well.  They were seen and evaluated by the attending physician who agreed with the assessment and plan.  The patient and / or the family were informed of the results of any tests, a time was given to answer questions, a plan was proposed and they agreed with plan.        FINAL IMPRESSION      1. Acute alcoholic intoxication without complication ([place])          DISPOSITION/PLAN   DISPOSITION Ed Observation 04/18/2017 02:04:05 AM      PATIENT REFERRED  TO:  No follow-up provider specified.    DISCHARGE MEDICATIONS:  New Prescriptions    No medications on file       DISCONTINUED MEDICATIONS:  Discontinued Medications    No medications on file              (Please note that portions of this note were completed with a voice recognition program.  Efforts were made to edit the dictations but occasionally words are mis-transcribed.)    Rema Fendt, APRN - CNP (electronically signed)           Rema Fendt, APRN - CNP  04/18/17 [phone]

## 2017-04-17 NOTE — ED Provider Notes (Signed)
I independently examined and evaluated Phillip Harper. In brief their history revealed patient with alcohol intoxication.  He presented with fall as well.  He complained of pain to his knee.  Their exam revealed intoxicated.  No acute distress.. All diagnostic, treatment, and disposition decisions were made by myself in conjunction with the mid-level provider. Before disposition, questions were sought and answered with the patient. They have voiced understanding and agree with the plan.     Patient's head CT and cervical spine CT are negative.  X-ray of his knee is negative.  Patient observed in the emergency room.  After several hours he became much more awake and alert and walking around the department in no acute distress.  Is very calm and polite.  We did call his ex-wife who arranged for one of his children come get him and he was discharged safely from the emergency department    For all further details of the patient's emergency department visit, please see the mid-level practitioner's documentation.        Meliton Rattan, MD  04/18/17 [phone]

## 2017-04-17 NOTE — ED Notes (Signed)
Bed: 22-01  Expected date:   Expected time:   Means of arrival: [facility]  Comments:  [facility]     Alex Gardener, RN  04/17/17 2337

## 2017-04-18 ENCOUNTER — Emergency Department: Admit: 2017-04-18 | Primary: Internal Medicine

## 2017-04-18 ENCOUNTER — Inpatient Hospital Stay
Admit: 2017-04-18 | Discharge: 2017-04-18 | Disposition: A | Discharge status: Home / Self Care | Attending: Emergency Medicine

## 2017-04-18 LAB — URINE DRUG SCREEN
Amphetamine Screen, Urine: NEGATIVE
Barbiturate Screen, Ur: NEGATIVE (ref <200)
Benzodiazepine Screen, Urine: NEGATIVE (ref <200)
Cannabinoid Scrn, Ur: NEGATIVE (ref <50)
Cocaine Metabolite Screen, Urine: NEGATIVE (ref <300)
Methadone Screen, Urine: NEGATIVE (ref <300)
Opiate Scrn, Ur: NEGATIVE (ref <300)
Oxycodone Urine: NEGATIVE (ref <100)
PCP Screen, Urine: NEGATIVE (ref <25)
Propoxyphene Scrn, Ur: NEGATIVE (ref <300)
pH, UA: 5.5

## 2017-04-18 LAB — COMPREHENSIVE METABOLIC PANEL
ALT: 36 U/L (ref 10–40)
AST: 82 U/L — ABNORMAL HIGH (ref 15–37)
Albumin/Globulin Ratio: 0.8 — ABNORMAL LOW (ref 1.1–2.2)
Albumin: 3.8 g/dL (ref 3.4–5.0)
Alkaline Phosphatase: 99 U/L (ref 40–129)
Anion Gap: 15 (ref 3–16)
BUN: 3 mg/dL — ABNORMAL LOW (ref 7–20)
CO2: 22 mmol/L (ref 21–32)
Calcium: 8.7 mg/dL (ref 8.3–10.6)
Chloride: 105 mmol/L (ref 99–110)
Creatinine: 0.6 mg/dL — ABNORMAL LOW (ref 0.9–1.3)
GFR African American: >60 (ref >60)
GFR Non-African American: >60 (ref >60)
Globulin: 4.6 g/dL
Glucose: 115 mg/dL — ABNORMAL HIGH (ref 70–99)
Potassium: 3.2 mmol/L — ABNORMAL LOW (ref 3.5–5.1)
Sodium: 142 mmol/L (ref 136–145)
Total Bilirubin: 0.4 mg/dL (ref 0.0–1.0)
Total Protein: 8.4 g/dL — ABNORMAL HIGH (ref 6.4–8.2)

## 2017-04-18 LAB — CBC WITH AUTO DIFFERENTIAL
Basophils %: 0.8 %
Basophils Absolute: 0.1 10*3/uL (ref 0.0–0.2)
Eosinophils %: 4.4 %
Eosinophils Absolute: 0.3 10*3/uL (ref 0.0–0.6)
Hematocrit: 39.2 % — ABNORMAL LOW (ref 40.5–52.5)
Hemoglobin: 13.5 g/dL (ref 13.5–17.5)
Lymphocytes %: 37.5 %
Lymphocytes Absolute: 2.4 10*3/uL (ref 1.0–5.1)
MCH: 35.9 pg — ABNORMAL HIGH (ref 26.0–34.0)
MCHC: 34.3 g/dL (ref 31.0–36.0)
MCV: 104.4 fL — ABNORMAL HIGH (ref 80.0–100.0)
MPV: 7.5 fL (ref 5.0–10.5)
Monocytes %: 7 %
Monocytes Absolute: 0.5 10*3/uL (ref 0.0–1.3)
Neutrophils %: 50.3 %
Neutrophils Absolute: 3.3 10*3/uL (ref 1.7–7.7)
Platelets: 186 10*3/uL (ref 135–450)
RBC: 3.76 M/uL — ABNORMAL LOW (ref 4.20–5.90)
RDW: 13.4 % (ref 12.4–15.4)
WBC: 6.5 10*3/uL (ref 4.0–11.0)

## 2017-04-18 LAB — ETHANOL: Ethanol Lvl: 350 mg/dL

## 2017-04-18 NOTE — ED Notes (Signed)
Pt received discharge instructions. Pt verbalizes understanding. Pt denies any questions. Pt able to ambulate free of distress out of the ED.        Tonette Lederer, RN  04/18/17 [phone]

## 2017-05-05 MED ORDER — AMLODIPINE BESYLATE 5 MG PO TABS
5 MG | ORAL_TABLET | ORAL | 2 refills | Status: DC
Start: 2017-05-05 — End: 2018-08-03

## 2017-05-05 MED ORDER — SIMVASTATIN 40 MG PO TABS
40 MG | ORAL_TABLET | ORAL | 2 refills | Status: DC
Start: 2017-05-05 — End: 2018-05-28

## 2017-05-23 MED ORDER — HYDRALAZINE HCL 25 MG PO TABS
25 MG | ORAL_TABLET | ORAL | 0 refills | Status: DC
Start: 2017-05-23 — End: 2017-08-26

## 2017-05-23 MED ORDER — ALLOPURINOL 300 MG PO TABS
300 MG | ORAL_TABLET | ORAL | 0 refills | Status: DC
Start: 2017-05-23 — End: 2017-08-20

## 2017-05-26 ENCOUNTER — Encounter

## 2017-05-26 ENCOUNTER — Ambulatory Visit
Admit: 2017-05-26 | Discharge: 2017-05-26 | Payer: BLUE CROSS/BLUE SHIELD | Attending: Internal Medicine | Primary: Internal Medicine

## 2017-05-26 DIAGNOSIS — E785 Hyperlipidemia, unspecified: Secondary | ICD-10-CM

## 2017-05-26 MED ORDER — MELOXICAM 7.5 MG PO TABS
7.5 MG | ORAL_TABLET | ORAL | 0 refills | Status: DC
Start: 2017-05-26 — End: 2017-08-25

## 2017-05-26 NOTE — Assessment & Plan Note (Signed)
Stable continue current meds and return in 3 mo.

## 2017-05-26 NOTE — Progress Notes (Signed)
Vaccine Information Sheet, "Influenza - Inactivated"  given to Phillip Harper, or parent/legal guardian of  Phillip Harper and verbalized understanding.    Patient responses:    Have you ever had a reaction to a flu vaccine? No  Are you able to eat eggs without adverse effects?  Yes  Do you have any current illness?  No  Have you ever had Guillian Barre Syndrome?  No    Flu vaccine given per order. Please see immunization tab.

## 2017-05-26 NOTE — Assessment & Plan Note (Signed)
Cont with symptoms weight down 22# will wear pads and will check labs again and refer back to Dr Fredonia Highland prn Imodium

## 2017-05-26 NOTE — Assessment & Plan Note (Signed)
Has slipped and started to drink again 2 mo ago none since then back to AA

## 2017-05-26 NOTE — Assessment & Plan Note (Signed)
Diet controled

## 2017-05-26 NOTE — Assessment & Plan Note (Signed)
No recent symptoms

## 2017-05-26 NOTE — Assessment & Plan Note (Signed)
No new symptoms occ incontinence with diarrhea

## 2017-05-26 NOTE — Assessment & Plan Note (Signed)
Stable continue current meds and return in 3 mo.    meds adjusted

## 2017-05-26 NOTE — Assessment & Plan Note (Signed)
Continue current meds

## 2017-05-26 NOTE — Progress Notes (Signed)
Subjective:      Patient ID: Phillip Harper is a 56 y.o. male.    HPI  Here today for follow up of chronic problems as per HPI and as problems listed under assessment and plan,no new c/o feels good other than cont to lose wierght now down 20 # cont with diarrhea 34/day no blood in stool  -had normal colonoscopy 2014       Hypertension   This is a chronic problem. The current episode started more than 1 year ago. The problem has been waxing and waning since onset. The problem is controlled. Associated symptoms include anxiety. Pertinent negatives include no chest pain, headaches, palpitations or shortness of breath. There are no associated agents to hypertension. Past treatments include beta blockers, lifestyle changes, central alpha agonists and calcium channel blockers. The current treatment provides significant improvement. There are no compliance problems.    Hyperlipidemia   This is a chronic problem. The current episode started more than 1 year ago. The problem is controlled. Recent lipid tests were reviewed and are normal. Factors aggravating his hyperlipidemia include beta blockers. Pertinent negatives include no chest pain or shortness of breath. Current antihyperlipidemic treatment includes diet change, exercise and statins. The current treatment provides significant improvement of lipids. There are no compliance problems.  Risk factors for coronary artery disease include dyslipidemia, hypertension and obesity.       Allergies   Allergen Reactions   . Shellfish-Derived Products Swelling       Current Outpatient Prescriptions   Medication Sig Dispense Refill   . meloxicam (MOBIC) 7.5 MG tablet TAKE 1 TABLET BY MOUTH DAILY. 90 tablet 0   . hydrALAZINE (APRESOLINE) 25 MG tablet TAKE 1 TABLET BY MOUTH EVERY 8 HOURS 270 tablet 0   . allopurinol (ZYLOPRIM) 300 MG tablet TAKE 1 TABLET BY MOUTH EVERY DAY 90 tablet 0   . amLODIPine (NORVASC) 5 MG tablet TAKE 1 TABLET BY MOUTH DAILY 90 tablet 2   . simvastatin  (ZOCOR) 40 MG tablet TAKE 1 TABLET BY MOUTH AT BEDTIME 90 tablet 2   . metoprolol succinate (TOPROL XL) 50 MG extended release tablet TAKE 1 TABLET BY MOUTH EVERY NIGHT 90 tablet 0   . Loperamide HCl (IMODIUM A-D PO) Take by mouth     . indomethacin (INDOCIN) 25 MG capsule Take 25 mg by mouth as needed for Pain     . hydrALAZINE (APRESOLINE) 25 MG tablet TAKE 1 TABLET BY MOUTH EVERY 8 HOURS 270 tablet 0   . albuterol-ipratropium (COMBIVENT RESPIMAT) 20-100 MCG/ACT AERS inhaler Inhale 1 puff into the lungs every 6 hours 1 Inhaler 0   . metoprolol succinate (TOPROL XL) 50 MG extended release tablet TAKE 1 TABLET BY MOUTH EVERY NIGHT 90 tablet 0   . colchicine (COLCRYS) 0.6 MG tablet 1 po prn for gout attack may repeat in 2 hours no more than 2/24 hrs 10 tablet 1   . docusate sodium (COLACE) 100 MG capsule Take 1 capsule by mouth 2 times daily as needed for Constipation 24 capsule 0   . Vitamin D (CHOLECALCIFEROL) 1000 UNITS CAPS capsule Take 2,000 Units by mouth daily.       No current facility-administered medications for this visit.        Past Medical History:   Diagnosis Date   . Cancer [facility]) 2016    Prostate [name]   . Clostridium difficile infection 08/01/2010   . Gout    . HTN (hypertension)    .  Hyperlipidemia    . Keloids 12/2003    multiple keloids on head   . Pancreatitis, alcoholic 72/53/66-44/03/47   . Vitamin D deficiency        Family History   Problem Relation Age of Onset   . Cancer Mother         lung CA   . Heart Disease Father         CAD   . Cancer Sister         lung CA       Past Surgical History:   Procedure Laterality Date   . COLONOSCOPY  02/04/13    Dr Fredonia Highland - normal repeat 2024   . OTHER SURGICAL HISTORY      KELOID SURGERY   . PROSTATE BIOPSY     . PROSTATECTOMY Bilateral 12/20/14    robotic assisted laparoscopic       Social History     Social History   . Marital status: Legally Separated     Spouse name: N/A   . Number of children: N/A   . Years of education: N/A     Occupational History    . Not on file.     Social History Main Topics   . Smoking status: Never Smoker   . Smokeless tobacco: Never Used   . Alcohol use No      Comment: has  not been drinking now in counsling and AA etc    . Drug use: No   . Sexual activity: Yes     Partners: Female     Other Topics Concern   . Not on file     Social History Narrative   . No narrative on file       Review of Systems  Review of Systems   Constitutional: Positive for unexpected weight change (down 22#).   HENT: Positive for congestion and rhinorrhea.    Eyes: Negative.         Glasses   Respiratory: Negative.  Negative for choking, shortness of breath and wheezing.    Cardiovascular: Negative.  Negative for chest pain and palpitations.   Gastrointestinal: Positive for diarrhea (occ .sx ,last C Diff was neg. ). Negative for blood in stool and vomiting.   Endocrine: Negative.  Negative for polydipsia, polyphagia and polyuria.   Genitourinary: Positive for frequency and urgency.        Nocturia -s/p prostate Ca check labs    Musculoskeletal: Negative.         Gout is stable since taking allopurinol    Skin: Negative.         Acne keloidas nuchae back of neck as noted    Allergic/Immunologic: Negative.    Neurological: Negative.  Negative for headaches.   Hematological: Negative.    Psychiatric/Behavioral: Negative.        Objective:   Physical Exam:  Physical Exam   Constitutional: He is oriented to person, place, and time. He appears well-developed and well-nourished.   HENT:   Head: Normocephalic and atraumatic.   Nose: Nose normal.   Mouth/Throat: Oropharynx is clear and moist.   Eyes: Conjunctivae and EOM are normal.   Neck: Normal range of motion. Neck supple. No tracheal deviation present. No thyromegaly present.   Cardiovascular: Normal rate, regular rhythm, normal heart sounds and intact distal pulses.    No murmur heard.  Pulmonary/Chest: Effort normal and breath sounds normal.   Abdominal: Soft. Bowel sounds are normal.   obese   Genitourinary:  Genitourinary Comments: Per [name]   Musculoskeletal: Normal range of motion. Tenderness: low back improved    Neurological: He is alert and oriented to person, place, and time. He has normal reflexes.   Skin: Skin is warm and dry.   Acne keloidas nuchae back of neck as noted    Psychiatric: He has a normal mood and affect. His behavior is normal.       BP 124/64 (Site: Right Upper Arm, Position: Sitting, Cuff Size: Medium Adult)   Pulse 68   Resp 14   Ht 5\' 9"  (1.753 m)   Wt 223 lb 6.4 oz (101.3 kg)   BMI 32.99 kg/m       Assessment & Plan:         Diarrhea  Cont with symptoms weight down 22# will wear pads and will check labs again and refer back to Dr Fredonia Highland prn Imodium     Alcohol abuse  Has slipped and started to drink again 2 mo ago none since then back to AA     Gout  No recent symptoms     HTN (hypertension)  Stable continue current meds and return in 3 mo.    meds adjusted     Hyperglycemia  Diet controled     Hyperlipidemia  Stable continue current meds and return in 3 mo.        Prostate CA ([facility])  No new symptoms occ incontinence with diarrhea     Vitamin D deficiency  Continue current meds

## 2017-05-27 LAB — COMPREHENSIVE METABOLIC PANEL
ALT: 37 U/L (ref 10–40)
AST: 79 U/L — ABNORMAL HIGH (ref 15–37)
Albumin/Globulin Ratio: 0.8 — ABNORMAL LOW (ref 1.1–2.2)
Albumin: 3.8 g/dL (ref 3.4–5.0)
Alkaline Phosphatase: 109 U/L (ref 40–129)
Anion Gap: 15 (ref 3–16)
BUN: <2 mg/dL — ABNORMAL LOW (ref 7–20)
CO2: 28 mmol/L (ref 21–32)
Calcium: 8.5 mg/dL (ref 8.3–10.6)
Chloride: 97 mmol/L — ABNORMAL LOW (ref 99–110)
Creatinine: 0.8 mg/dL — ABNORMAL LOW (ref 0.9–1.3)
GFR African American: >60 (ref >60)
GFR Non-African American: >60 (ref >60)
Globulin: 4.5 g/dL
Glucose: 96 mg/dL (ref 70–99)
Potassium: 2.9 mmol/L — CL (ref 3.5–5.1)
Sodium: 140 mmol/L (ref 136–145)
Total Bilirubin: 0.6 mg/dL (ref 0.0–1.0)
Total Protein: 8.3 g/dL — ABNORMAL HIGH (ref 6.4–8.2)

## 2017-05-27 LAB — CBC WITH AUTO DIFFERENTIAL
Basophils %: 0.3 %
Basophils Absolute: 0 10*3/uL (ref 0.0–0.2)
Eosinophils %: 5.7 %
Eosinophils Absolute: 0.4 10*3/uL (ref 0.0–0.6)
Hematocrit: 38.6 % — ABNORMAL LOW (ref 40.5–52.5)
Hemoglobin: 13.6 g/dL (ref 13.5–17.5)
Lymphocytes %: 40.4 %
Lymphocytes Absolute: 2.6 10*3/uL (ref 1.0–5.1)
MCH: 36.5 pg — ABNORMAL HIGH (ref 26.0–34.0)
MCHC: 35.3 g/dL (ref 31.0–36.0)
MCV: 103.3 fL — ABNORMAL HIGH (ref 80.0–100.0)
MPV: 8.6 fL (ref 5.0–10.5)
Monocytes %: 6.7 %
Monocytes Absolute: 0.4 10*3/uL (ref 0.0–1.3)
Neutrophils %: 46.9 %
Neutrophils Absolute: 3 10*3/uL (ref 1.7–7.7)
Platelets: 181 10*3/uL (ref 135–450)
RBC: 3.73 M/uL — ABNORMAL LOW (ref 4.20–5.90)
RDW: 14.4 % (ref 12.4–15.4)
WBC: 6.3 10*3/uL (ref 4.0–11.0)

## 2017-05-27 LAB — HEMOGLOBIN A1C
Hemoglobin A1C: 5.6 %
eAG: 114 mg/dL

## 2017-05-27 LAB — LIPID PANEL
Cholesterol, Total: 124 mg/dL (ref 0–199)
HDL: 66 mg/dL — ABNORMAL HIGH (ref 40–60)
LDL Calculated: 46 mg/dL (ref <100)
Triglycerides: 62 mg/dL (ref 0–150)
VLDL Cholesterol Calculated: 12 mg/dL

## 2017-05-27 LAB — VITAMIN D 25 HYDROXY: Vit D, 25-Hydroxy: 24.9 ng/mL — ABNORMAL LOW (ref >=30)

## 2017-05-27 LAB — TSH WITH REFLEX: TSH: 1.79 u[IU]/mL (ref 0.27–4.20)

## 2017-05-28 MED ORDER — POTASSIUM CHLORIDE CRYS ER 10 MEQ PO TBCR
10 MEQ | ORAL_TABLET | Freq: Three times a day (TID) | ORAL | 1 refills | Status: DC
Start: 2017-05-28 — End: 2017-08-11

## 2017-05-29 LAB — C DIFF TOXIN/ANTIGEN: C difficile Toxin, EIA: NEGATIVE

## 2017-06-03 ENCOUNTER — Inpatient Hospital Stay: Payer: BLUE CROSS/BLUE SHIELD | Primary: Internal Medicine

## 2017-06-03 DIAGNOSIS — R197 Diarrhea, unspecified: Secondary | ICD-10-CM

## 2017-06-04 LAB — O&P SCREEN(CRYPTOSPORIDIUM/GIARDIA/E.HISTOLYTICA) #1
Cryptosporidium Ag: NEGATIVE
E Histolytica Ag: NEGATIVE
Giardia Ag, Stl: NEGATIVE

## 2017-06-06 LAB — FECAL FAT, QUALITATIVE
FECAL SPLIT FATS: INCREASED — AB
Fecal Neutral Fat: INCREASED — AB

## 2017-06-06 LAB — GASTROINTESTINAL PANEL, MOLECULAR: GI Bacterial Pathogens By PCR: NOT DETECTED

## 2017-06-13 ENCOUNTER — Encounter

## 2017-06-13 ENCOUNTER — Inpatient Hospital Stay: Admit: 2017-06-13 | Payer: BLUE CROSS/BLUE SHIELD | Primary: Internal Medicine

## 2017-06-13 DIAGNOSIS — K859 Acute pancreatitis without necrosis or infection, unspecified: Secondary | ICD-10-CM

## 2017-06-13 MED ORDER — IOHEXOL 240 MG/ML IJ SOLN
240 MG/ML | Freq: Once | INTRAMUSCULAR | Status: AC | PRN
Start: 2017-06-13 — End: 2017-06-13
  Administered 2017-06-13: 22:00:00 50 mL via ORAL

## 2017-06-13 MED ORDER — IOPAMIDOL 76 % IV SOLN
76 % | Freq: Once | INTRAVENOUS | Status: AC | PRN
Start: 2017-06-13 — End: 2017-06-13
  Administered 2017-06-13: 22:00:00 75 mL via INTRAVENOUS

## 2017-06-19 ENCOUNTER — Encounter: Attending: Surgery | Primary: Internal Medicine

## 2017-06-20 ENCOUNTER — Encounter: Attending: Surgery | Primary: Internal Medicine

## 2017-06-23 ENCOUNTER — Ambulatory Visit
Admit: 2017-06-23 | Discharge: 2017-06-23 | Payer: BLUE CROSS/BLUE SHIELD | Attending: Surgery | Primary: Internal Medicine

## 2017-06-23 DIAGNOSIS — K389 Disease of appendix, unspecified: Secondary | ICD-10-CM

## 2017-06-23 NOTE — Communication Body (Signed)
Assessment:     Pleasant 56 year old male who is being by Dr. 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  A CAT scan showed an atrophic pancreas with a dilated pancreatic duct as well as a dilated, fluid-filled appendix measuring up to 1.3 cm.  The patient has had nausea, vomiting and diarrhea related to the chronic pancreatitis but he has had no abdominal pain or symptoms consistent with acute appendicitis.  Nonetheless, I would recommend elective appendectomy or cecectomy/appendectomy.        Plan:     The patient has both an EUS and colonoscopy scheduled for December of this year.  We will likely plan for laparoscopic appendectomy after the first of the year.

## 2017-06-23 NOTE — Progress Notes (Signed)
Subjective:     Phillip Harper is a 56 y.o. male     CC: Abnormal appendix    HPI: 56 year old male with a history of chronic pancreatitis who recently had a CAT scan of the abdomen and pelvis which demonstrated a dilated, fluid-filled appendix.  He denies a history of abdominal pain but does report nausea, vomiting, chills and diarrhea.Marland Kitchen  He is currently being treated by Dr. Scheryl Darter for chronic pancreatitis.  He is losing weight and was recently started on Creon. There are plans in December for both an EUS and colonoscopy.  No history of fevers or urinary symptoms.        Family History   Problem Relation Age of Onset   . Cancer Mother         lung CA   . Heart Disease Father         CAD   . Cancer Sister         lung CA       Past Medical History:   Diagnosis Date   . Cancer [facility]) 2016    Prostate [name]   . Clostridium difficile infection 08/01/2010   . Gout    . HTN (hypertension)    . Hyperlipidemia    . Keloids 12/2003    multiple keloids on head   . Pancreatitis, alcoholic 65/78/46-96/29/52   . Vitamin D deficiency        Past Surgical History:   Procedure Laterality Date   . COLONOSCOPY  02/04/13    Dr Fredonia Highland - normal repeat 2024   . OTHER SURGICAL HISTORY      KELOID SURGERY   . PROSTATE BIOPSY     . PROSTATECTOMY Bilateral 12/20/14    robotic assisted laparoscopic           Prior to Visit Medications    Medication Sig Taking? Authorizing Provider   potassium chloride (KLOR-CON M) 10 MEQ extended release tablet Take 1 tablet by mouth 3 times daily Yes Ignacia Felling, MD   meloxicam (MOBIC) 7.5 MG tablet TAKE 1 TABLET BY MOUTH DAILY. Yes Ignacia Felling, MD   hydrALAZINE (APRESOLINE) 25 MG tablet TAKE 1 TABLET BY MOUTH EVERY 8 HOURS Yes Ignacia Felling, MD   allopurinol (ZYLOPRIM) 300 MG tablet TAKE 1 TABLET BY MOUTH EVERY DAY Yes Ignacia Felling, MD   amLODIPine (NORVASC) 5 MG tablet TAKE 1 TABLET BY MOUTH DAILY Yes Ignacia Felling, MD   simvastatin (ZOCOR) 40 MG tablet TAKE 1 TABLET BY MOUTH AT BEDTIME Yes  Ignacia Felling, MD   metoprolol succinate (TOPROL XL) 50 MG extended release tablet TAKE 1 TABLET BY MOUTH EVERY NIGHT Yes Ignacia Felling, MD   Loperamide HCl (IMODIUM A-D PO) Take by mouth Yes Historical Provider, MD   indomethacin (INDOCIN) 25 MG capsule Take 25 mg by mouth as needed for Pain Yes Historical Provider, MD   hydrALAZINE (APRESOLINE) 25 MG tablet TAKE 1 TABLET BY MOUTH EVERY 8 HOURS Yes Ignacia Felling, MD   albuterol-ipratropium (COMBIVENT RESPIMAT) 20-100 MCG/ACT AERS inhaler Inhale 1 puff into the lungs every 6 hours Yes Tobie Poet, MD   metoprolol succinate (TOPROL XL) 50 MG extended release tablet TAKE 1 TABLET BY MOUTH EVERY NIGHT Yes Ignacia Felling, MD   colchicine (COLCRYS) 0.6 MG tablet 1 po prn for gout attack may repeat in 2 hours no more than 2/24 hrs Yes Ignacia Felling, MD   docusate sodium (COLACE) 100 MG capsule Take 1 capsule by mouth 2 times daily as  needed for Constipation Yes Halford Chessman, APRN - CNP   Vitamin D (CHOLECALCIFEROL) 1000 UNITS CAPS capsule Take 2,000 Units by mouth daily. Yes Historical Provider, MD       Social History     Social History   . Marital status: Legally Separated     Spouse name: N/A   . Number of children: N/A   . Years of education: N/A     Occupational History   . Not on file.     Social History Main Topics   . Smoking status: Never Smoker   . Smokeless tobacco: Never Used   . Alcohol use No      Comment: has  not been drinking now in counsling and AA etc    . Drug use: No   . Sexual activity: Yes     Partners: Female     Other Topics Concern   . Not on file     Social History Narrative   . No narrative on file       Review of Systems   Constitutional: Positive for appetite change, fatigue and unexpected weight change.   HENT: Negative.    Eyes: Negative.    Respiratory: Negative.    Cardiovascular: Negative.    Gastrointestinal: Positive for diarrhea, nausea and vomiting.   Endocrine: Negative.    Genitourinary: Negative.    Musculoskeletal:  Negative.    Skin: Negative.    Neurological: Positive for weakness and light-headedness.   Hematological: Negative.    Psychiatric/Behavioral: Positive for sleep disturbance.       Objective:   Physical Exam   Constitutional: He is oriented to person, place, and time. He appears well-developed and well-nourished.   HENT:   Head: Normocephalic and atraumatic.   Right Ear: External ear normal.   Left Ear: External ear normal.   Eyes: Conjunctivae and EOM are normal.   Neck: Normal range of motion. Neck supple.   Cardiovascular: Normal rate and regular rhythm.    Pulmonary/Chest: Effort normal and breath sounds normal.   Abdominal: Soft. He exhibits no distension. There is no tenderness.   Musculoskeletal: Normal range of motion. He exhibits no edema.   Neurological: He is alert and oriented to person, place, and time.   Skin: Skin is warm and dry.   Psychiatric: He has a normal mood and affect. His behavior is normal.       Assessment:      Pleasant 56 year old male who is being by Dr. 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  A CAT scan showed an atrophic pancreas with a dilated pancreatic duct as well as a dilated, fluid-filled appendix measuring up to 1.3 cm.  The patient has had nausea, vomiting and diarrhea related to the chronic pancreatitis but he has had no abdominal pain or symptoms consistent with acute appendicitis.  Nonetheless, I would recommend elective appendectomy or cecectomy/appendectomy.        Plan:      The patient has both an EUS and colonoscopy scheduled for December of this year.  We will likely plan for laparoscopic appendectomy after the first of the year.

## 2017-06-23 NOTE — Progress Notes (Signed)
Patient not reached.  Preop instructions left on voice mail. Number_  [phone]______________    -Date_12/10/18______time__1000_____arrival___FEC 0830_________  -Nothing to eat or drink after midnight  -Responsible adult 49 or older to stay on site while you are here and drive you home and stay with you after  -Follow any instructions your doctors office has given you  -Bring a complete list of all your medications and supplements  -If you normally take the following medications in the morning please do so with a small    sip of water-heart,blood pressure,seizure,breathing or thyroid-avoid water pillls and any blood pressure medications ending in "artan" or "pril"  -You may use your inhalers  -Take half of your normal dose of any long acting insulins the night before-do not take    any diabetic medications in the morning  -Follow your doctors instructions regarding blood thinners  -Any questions call your surgeons office  Anesthesia attempts to review all Endo charts prior to surgery and will place any PAT orders,Surgery patients will have orders placed based on history in chart which may not be complete  ENDOSCOPY PATIENTS ONLY-FOLLOW YOUR DOCTORS BOWEL PREP INSTRUCTIONS,THIS MAY INCLUDE TAKING A SECOND PORTION OF YOUR PREP AFTER MIDNIGHT

## 2017-07-03 NOTE — Progress Notes (Signed)
Patient not reached.  Preop instructions left on voice mail. Number_[phone]______________    -Date_12/19/18______time_1300______arrival__1130__________  -Nothing to eat or drink after midnight  -Responsible adult 70 or older to stay on site while you are here and drive you home and stay with you after  -Follow any instructions your doctors office has given you  -Bring a complete list of all your medications and supplements  -If you normally take the following medications in the morning please do so with a small    sip of water-heart,blood pressure,seizure,breathing or thyroid-avoid water pillls and any blood pressure medications ending in "artan" or "pril"  -You may use your inhalers  -Take half of your normal dose of any long acting insulins the night before-do not take    any diabetic medications in the morning  -Follow your doctors instructions regarding blood thinners  -Any questions call your surgeons office  Anesthesia attempts to review all Endo charts prior to surgery and will place any PAT orders,Surgery patients will have orders placed based on history in chart which may not be complete  ENDOSCOPY PATIENTS ONLY-FOLLOW YOUR DOCTORS BOWEL PREP INSTRUCTIONS,THIS MAY INCLUDE TAKING A SECOND PORTION OF YOUR PREP AFTER MIDNIGHT

## 2017-07-14 ENCOUNTER — Inpatient Hospital Stay: Payer: BLUE CROSS/BLUE SHIELD

## 2017-07-14 MED ORDER — LIDOCAINE HCL (PF) 1 % IJ SOLN
1 % | Freq: Once | INTRAMUSCULAR | Status: DC | PRN
Start: 2017-07-14 — End: 2017-07-14

## 2017-07-14 MED ORDER — LIDOCAINE HCL (PF) 2 % IJ SOLN
2 % | INTRAMUSCULAR | Status: DC | PRN
Start: 2017-07-14 — End: 2017-07-14
  Administered 2017-07-14: 15:00:00 100 via INTRAVENOUS

## 2017-07-14 MED ORDER — PROPOFOL 200 MG/20ML IV EMUL
200 MG/20ML | INTRAVENOUS | Status: DC | PRN
Start: 2017-07-14 — End: 2017-07-14
  Administered 2017-07-14: 15:00:00 300 via INTRAVENOUS

## 2017-07-14 MED ORDER — NORMAL SALINE FLUSH 0.9 % IV SOLN
0.9 % | INTRAVENOUS | Status: DC | PRN
Start: 2017-07-14 — End: 2017-07-14

## 2017-07-14 MED ORDER — NORMAL SALINE FLUSH 0.9 % IV SOLN
0.9 % | Freq: Two times a day (BID) | INTRAVENOUS | Status: DC
Start: 2017-07-14 — End: 2017-07-14

## 2017-07-14 MED ORDER — SODIUM CHLORIDE 0.9 % IV SOLN
0.9 % | INTRAVENOUS | Status: DC
Start: 2017-07-14 — End: 2017-07-14
  Administered 2017-07-14: 14:00:00 via INTRAVENOUS

## 2017-07-14 MED ORDER — PROPOFOL 200 MG/20ML IV EMUL
200 MG/20ML | INTRAVENOUS | Status: DC | PRN
Start: 2017-07-14 — End: 2017-07-14
  Administered 2017-07-14: 15:00:00 150 via INTRAVENOUS

## 2017-07-14 NOTE — Progress Notes (Signed)
Discharge instructions reviewed with patient/responsible adult. All home medications have been reviewed, questions answered and patient verbalized understanding.  Discharge instructions signed and copies given. Patient discharged  with belongings.

## 2017-07-14 NOTE — Progress Notes (Signed)
To endo recovery from procedure room. VSS, awakens to voice, respirations easy and unlabored.

## 2017-07-14 NOTE — Progress Notes (Signed)
Name:  Phillip Harper  Age:  56 y.o.  CSN:  [id number]            Past Medical History:        Diagnosis Date   . Cancer [facility]) 2016    Prostate [name]   . Clostridium difficile infection 08/01/2010   . Gout    . HTN (hypertension)    . Hyperlipidemia    . Keloids 12/2003    multiple keloids on head   . Pancreatitis, alcoholic 82/95/62-13/08/65   . Vitamin D deficiency        Past Surgical History:      Procedure Laterality Date   . COLONOSCOPY  02/04/13    Dr Fredonia Highland - normal repeat 2024   . OTHER SURGICAL HISTORY      KELOID SURGERY   . PROSTATE BIOPSY     . PROSTATECTOMY Bilateral 12/20/14    robotic assisted laparoscopic       Medications Prior to Admission:  Prescriptions Prior to Admission: Pancrelipase, Lip-Prot-Amyl, (CREON PO), Take by mouth  potassium chloride (KLOR-CON M) 10 MEQ extended release tablet, Take 1 tablet by mouth 3 times daily  meloxicam (MOBIC) 7.5 MG tablet, TAKE 1 TABLET BY MOUTH DAILY.  hydrALAZINE (APRESOLINE) 25 MG tablet, TAKE 1 TABLET BY MOUTH EVERY 8 HOURS  allopurinol (ZYLOPRIM) 300 MG tablet, TAKE 1 TABLET BY MOUTH EVERY DAY  amLODIPine (NORVASC) 5 MG tablet, TAKE 1 TABLET BY MOUTH DAILY  simvastatin (ZOCOR) 40 MG tablet, TAKE 1 TABLET BY MOUTH AT BEDTIME  indomethacin (INDOCIN) 25 MG capsule, Take 25 mg by mouth as needed for Pain  metoprolol succinate (TOPROL XL) 50 MG extended release tablet, TAKE 1 TABLET BY MOUTH EVERY NIGHT  colchicine (COLCRYS) 0.6 MG tablet, 1 po prn for gout attack may repeat in 2 hours no more than 2/24 hrs  docusate sodium (COLACE) 100 MG capsule, Take 1 capsule by mouth 2 times daily as needed for Constipation  Vitamin D (CHOLECALCIFEROL) 1000 UNITS CAPS capsule, Take 2,000 Units by mouth daily.  metoprolol succinate (TOPROL XL) 50 MG extended release tablet, TAKE 1 TABLET BY MOUTH EVERY NIGHT  Loperamide HCl (IMODIUM A-D PO), Take by mouth  hydrALAZINE (APRESOLINE) 25 MG tablet, TAKE 1 TABLET BY MOUTH EVERY 8 HOURS  albuterol-ipratropium (COMBIVENT  RESPIMAT) 20-100 MCG/ACT AERS inhaler, Inhale 1 puff into the lungs every 6 hours    Allergies:  Shellfish-derived products    Social History:  Social History     Social History   . Marital status: Legally Separated     Spouse name: N/A   . Number of children: N/A   . Years of education: N/A     Occupational History   . Not on file.     Social History Main Topics   . Smoking status: Never Smoker   . Smokeless tobacco: Never Used   . Alcohol use No      Comment: has not been drinking for about 3-4 months counseling and AA etc    . Drug use: No   . Sexual activity: Yes     Partners: Female     Other Topics Concern   . Not on file     Social History Narrative   . No narrative on file       Family History:  Family History   Problem Relation Age of Onset   . Cancer Mother         lung CA   . Heart  Disease Father         CAD   . Cancer Sister         lung CA

## 2017-07-14 NOTE — Progress Notes (Signed)
Post-procedure education reviewed with patient and visitor, and they verbalized understanding.

## 2017-07-14 NOTE — Anesthesia Post-Procedure Evaluation (Signed)
Department of Anesthesiology  Postprocedure Note    Patient: Phillip Harper  MRN: [id number]  Birthdate: [birth year]  Date of evaluation: 07/14/2017  Time:  12:16 PM     Procedure Summary     Date:  07/14/17 Room / Location:  [facility] ENDO 01 / [facility] END[facility]COPY    Anesthesia Start:  6226 Anesthesia Stop:  3335    Procedure:  COLONOSCOPY WITH BIOPSY (N/A ) Diagnosis:  (DIARRHEA R19.7)    Surgeon:  Waynard Reeds, MD Responsible Provider:  Judie Bonus, MD    Anesthesia Type:  MAC ASA Status:  2          Anesthesia Type: MAC    Aldrete Phase I: Aldrete Score: 10    Aldrete Phase II: Aldrete Score: 10    Last vitals: Reviewed and per EMR flowsheets.       Anesthesia Post Evaluation    Patient location during evaluation: PACU  Complications: no  Cardiovascular status: hemodynamically stable  Respiratory status: acceptable

## 2017-07-14 NOTE — Progress Notes (Signed)
Teaching / education initiated regarding perioperative experience, expectations, and pain management during stay. Patient verbalized understanding.

## 2017-07-14 NOTE — Progress Notes (Signed)
Sedation provided per anesthesia. See anesthesia record for medication administration and vitals.

## 2017-07-14 NOTE — Anesthesia Pre-Procedure Evaluation (Signed)
Department of Anesthesiology  Preprocedure Note       Name:  Phillip Harper   Age:  56 y.o.  DOB:  August 18, 1960                                          MRN:  [id number]         Date:  07/14/2017      Surgeon: Juliann Mule):  Waynard Reeds, MD    Procedure: COLONOSCOPY (N/A )    Medications prior to admission:   Prior to Admission medications    Medication Sig Start Date End Date Taking? Authorizing Provider   potassium chloride (KLOR-CON M) 10 MEQ extended release tablet Take 1 tablet by mouth 3 times daily 05/28/17   Ignacia Felling, MD   meloxicam (MOBIC) 7.5 MG tablet TAKE 1 TABLET BY MOUTH DAILY. 05/26/17   Ignacia Felling, MD   hydrALAZINE (APRESOLINE) 25 MG tablet TAKE 1 TABLET BY MOUTH EVERY 8 HOURS 05/23/17   Ignacia Felling, MD   allopurinol (ZYLOPRIM) 300 MG tablet TAKE 1 TABLET BY MOUTH EVERY DAY 05/23/17   Ignacia Felling, MD   amLODIPine (NORVASC) 5 MG tablet TAKE 1 TABLET BY MOUTH DAILY 05/05/17   Ignacia Felling, MD   simvastatin (ZOCOR) 40 MG tablet TAKE 1 TABLET BY MOUTH AT BEDTIME 05/05/17   Ignacia Felling, MD   metoprolol succinate (TOPROL XL) 50 MG extended release tablet TAKE 1 TABLET BY MOUTH EVERY NIGHT 04/16/17   Ignacia Felling, MD   Loperamide HCl (IMODIUM A-D PO) Take by mouth    Historical Provider, MD   indomethacin (INDOCIN) 25 MG capsule Take 25 mg by mouth as needed for Pain    Historical Provider, MD   hydrALAZINE (APRESOLINE) 25 MG tablet TAKE 1 TABLET BY MOUTH EVERY 8 HOURS 01/29/16   Ignacia Felling, MD   albuterol-ipratropium (COMBIVENT RESPIMAT) 20-100 MCG/ACT AERS inhaler Inhale 1 puff into the lungs every 6 hours 12/25/15   Tobie Poet, MD   metoprolol succinate (TOPROL XL) 50 MG extended release tablet TAKE 1 TABLET BY MOUTH EVERY NIGHT 10/02/15   Ignacia Felling, MD   colchicine (COLCRYS) 0.6 MG tablet 1 po prn for gout attack may repeat in 2 hours no more than 2/24 hrs 03/15/15   Ignacia Felling, MD   docusate sodium (COLACE) 100 MG capsule Take 1 capsule by mouth 2 times daily  as needed for Constipation 12/22/14   Halford Chessman, APRN - CNP   Vitamin D (CHOLECALCIFEROL) 1000 UNITS CAPS capsule Take 2,000 Units by mouth daily.    Historical Provider, MD       Current medications:    No current facility-administered medications for this encounter.        Allergies:    Allergies   Allergen Reactions   . Shellfish-Derived Products Swelling       Problem List:    Patient Active Problem List   Diagnosis Code   . Gout M10.9   . Hyperlipidemia E78.5   . HTN (hypertension) I10   . Vitamin D deficiency E55.9   . Alcohol abuse F10.10   . Diarrhea R19.7   . Prostate CA ([place]) C61   . 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 Hyperglycemia R73.9   . 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  Cancer [facility]) 2016    Prostate [name]   . Clostridium difficile infection 08/01/2010   . Gout    . HTN (hypertension)    . Hyperlipidemia    . Keloids 12/2003    multiple keloids on head   . Pancreatitis, alcoholic 41/32/44-08/06/70   . Vitamin D deficiency        Past Surgical History:        Procedure Laterality Date   . COLONOSCOPY  02/04/13    Dr Fredonia Highland - normal repeat 2024   . OTHER SURGICAL HISTORY      KELOID SURGERY   . PROSTATE BIOPSY     . PROSTATECTOMY Bilateral 12/20/14    robotic assisted laparoscopic       Social History:    Social History   Substance Use Topics   . Smoking status: Never Smoker   . Smokeless tobacco: Never Used   . Alcohol use No      Comment: has  not been drinking now in counsling and AA etc                                 Counseling given: Not Answered      Vital Signs (Current): There were no vitals filed for this visit.                                           BP Readings from Last 3 Encounters:   06/23/17 (!) 158/82   05/26/17 124/64   04/17/17 (!) 125/91       NPO Status:                                                                                 BMI:   Wt Readings from Last 3 Encounters:   06/23/17 225 lb (102.1 kg)   05/26/17 223 lb 6.4 oz (101.3  kg)   04/17/17 244 lb (110.7 kg)     There is no height or weight on file to calculate BMI.    CBC:   Lab Results   Component Value Date    WBC 6.3 05/26/2017    RBC 3.73 05/26/2017    HGB 13.6 05/26/2017    HCT 38.6 05/26/2017    MCV 103.3 05/26/2017    RDW 14.4 05/26/2017    PLT 181 05/26/2017       CMP:   Lab Results   Component Value Date    NA 140 05/26/2017    K 2.9 05/26/2017    CL 97 05/26/2017    CO2 28 05/26/2017    BUN <2 05/26/2017    CREATININE 0.8 05/26/2017    GFRAA >60 05/26/2017    GFRAA >60 11/08/2011    AGRATIO 0.8 05/26/2017    LABGLOM >60 05/26/2017    LABGLOM 95 12/06/2009    GLUCOSE 96 05/26/2017    GLUCOSE 89 12/06/2009    PROT 8.3 05/26/2017    PROT 8.8 05/08/2011    CALCIUM 8.5 05/26/2017    BILITOT 0.6  05/26/2017    ALKPHOS 109 05/26/2017    AST 79 05/26/2017    ALT 37 05/26/2017       POC Tests: No results for input(s): POCGLU, POCNA, POCK, POCCL, POCBUN, POCHEMO, POCHCT in the last 72 hours.    Coags:   Lab Results   Component Value Date    PROTIME 14.2 07/27/2010    INR 1.1 07/27/2010       HCG (If Applicable): No results found for: PREGTESTUR, PREGSERUM, HCG, HCGQUANT     ABGs:   Lab Results   Component Value Date    PHART 7.44 07/27/2010    PO2ART 67 07/27/2010    PCO2ART 33 07/27/2010    HCO3ART 22 07/27/2010    BEART -1.0 07/27/2010        Type & Screen (If Applicable):  No results found for: LABABO, LABRH    Anesthesia Evaluation    Airway: Mallampati: II  TM distance: >3 FB   Neck ROM: full  Mouth opening: > = 3 FB Dental:          Pulmonary:                              Cardiovascular:    (+) hypertension:,         Rhythm: regular  Rate: normal                    Neuro/Psych:   (+) psychiatric history:            GI/Hepatic/Renal:             Endo/Other:                     Abdominal:           Vascular:                                        Anesthesia Plan      MAC     ASA 2       Induction: intravenous.      Anesthetic plan and risks discussed with patient.      Plan discussed  with CRNA.                  Judie Bonus, MD   07/14/2017

## 2017-07-14 NOTE — Discharge Instructions (Signed)
COLONSCOPY DISCHARGE INSTRUCTIONS     You may experience some lightheadedness for the next several hours.   Plan on quiet relaxation for the rest of today. Nap for four hours following procedure if possible.   A responsible adult needs to stay with you today.   Eat bland food and avoid anything greasy or spicy initially-progress to your normal diet gradually.   Diet restrictions as instructed.   You may resume home medications as instructed.   It is not unusual to experience some mild cramping or gas pains, and you may not have a bowel movement for several days.   If you have any of the following problems, notify your physician or return to the hospital emergency room : fever, chills, excessive bleeding, excessive vomiting, difficulty swallowing, uncontrolled pain, increased abdominal distention, shortness of breath or any other problems.   If you had a polyp removed, avoid strenuous activity for 48 hours.Avoid the use of aspirin or related compounds for one week, unless otherwise instructed by your physician.  You may notice a small amount of blood in your next few bowel movements, but if a large amount passes, call your physician.   Call your doctor at [phone] if you have any concerns, and call for results in 5-7 business days if you had any biopsies or polyps.   Make a follow up appointment for ___________________________.  See your physician's report for details about your procedure and recommendations.    ANESTHESIA DISCHARGE INSTRUCTIONS     Wear your seatbelt home.   You are under the influence of drugs-do not drink alcohol, drive ,operate machinery,or make any important decisions or sign any legal documentsfor 24 hours. You may resume normal activities tomorrow.   A responsible adult needs to be with you for 24 hours.   You may experience lightheadedness,dizziness,or sleepiness following surgery.   Rest at home today- increase activity as tolerated. It is recommended to take a four hour  nap after procedure.   Progress slowly to a regular diet unless your physician has instructed you otherwise. Avoid spicy and greasy food on first meal.  Drink plenty of water.   If nausea becomes a problem call your physician.   Call your doctor if concerns arise.  

## 2017-07-21 MED ORDER — METOPROLOL SUCCINATE ER 50 MG PO TB24
50 MG | ORAL_TABLET | ORAL | 1 refills | Status: DC
Start: 2017-07-21 — End: 2017-08-26

## 2017-07-23 ENCOUNTER — Ambulatory Visit: Admit: 2017-07-23 | Primary: Internal Medicine

## 2017-07-23 ENCOUNTER — Inpatient Hospital Stay: Payer: BLUE CROSS/BLUE SHIELD

## 2017-07-23 LAB — BASIC METABOLIC PANEL
Anion Gap: 13 (ref 3–16)
BUN: <2 mg/dL — ABNORMAL LOW (ref 7–20)
CO2: 22 mmol/L (ref 21–32)
Calcium: 9 mg/dL (ref 8.3–10.6)
Chloride: 108 mmol/L (ref 99–110)
Creatinine: 0.6 mg/dL — ABNORMAL LOW (ref 0.9–1.3)
GFR African American: >60 (ref >60)
GFR Non-African American: >60 (ref >60)
Glucose: 92 mg/dL (ref 70–99)
Potassium: 4.2 mmol/L (ref 3.5–5.1)
Sodium: 143 mmol/L (ref 136–145)

## 2017-07-23 MED ORDER — HYDROMORPHONE HCL 2 MG/ML IJ SOLN
2 MG/ML | INTRAMUSCULAR | Status: DC | PRN
Start: 2017-07-23 — End: 2017-07-23

## 2017-07-23 MED ORDER — NORMAL SALINE FLUSH 0.9 % IV SOLN
0.9 % | INTRAVENOUS | Status: DC | PRN
Start: 2017-07-23 — End: 2017-07-23

## 2017-07-23 MED ORDER — HYDROCODONE-ACETAMINOPHEN 5-325 MG PO TABS
5-325 MG | Freq: Once | ORAL | Status: DC | PRN
Start: 2017-07-23 — End: 2017-07-23

## 2017-07-23 MED ORDER — SODIUM CHLORIDE 0.9 % IV SOLN
0.9 % | INTRAVENOUS | Status: DC
Start: 2017-07-23 — End: 2017-07-23
  Administered 2017-07-23: 18:00:00 via INTRAVENOUS

## 2017-07-23 MED ORDER — NORMAL SALINE FLUSH 0.9 % IV SOLN
0.9 % | Freq: Two times a day (BID) | INTRAVENOUS | Status: DC
Start: 2017-07-23 — End: 2017-07-23

## 2017-07-23 MED ORDER — LIDOCAINE HCL 2 % IJ SOLN
2 % | INTRAMUSCULAR | Status: DC | PRN
Start: 2017-07-23 — End: 2017-07-23
  Administered 2017-07-23: 18:00:00 85 via INTRAVENOUS
  Administered 2017-07-23: 18:00:00 15 via INTRAVENOUS

## 2017-07-23 MED ORDER — ONDANSETRON HCL 4 MG/2ML IJ SOLN
4 MG/2ML | Freq: Once | INTRAMUSCULAR | Status: DC | PRN
Start: 2017-07-23 — End: 2017-07-23

## 2017-07-23 MED ORDER — PROPOFOL 200 MG/20ML IV EMUL
200 MG/20ML | INTRAVENOUS | Status: DC | PRN
Start: 2017-07-23 — End: 2017-07-23
  Administered 2017-07-23: 18:00:00 30 via INTRAVENOUS
  Administered 2017-07-23 (×2): 50 via INTRAVENOUS
  Administered 2017-07-23: 18:00:00 30 via INTRAVENOUS
  Administered 2017-07-23: 18:00:00 170 via INTRAVENOUS
  Administered 2017-07-23: 18:00:00 30 via INTRAVENOUS

## 2017-07-23 MED ORDER — PROPOFOL 200 MG/20ML IV EMUL
200 MG/20ML | INTRAVENOUS | Status: DC | PRN
Start: 2017-07-23 — End: 2017-07-23
  Administered 2017-07-23: 18:00:00 130 via INTRAVENOUS

## 2017-07-23 MED ORDER — MEPERIDINE HCL 25 MG/ML IJ SOLN
25 MG/ML | INTRAMUSCULAR | Status: DC | PRN
Start: 2017-07-23 — End: 2017-07-23

## 2017-07-23 NOTE — Progress Notes (Signed)
Teaching / education initiated regarding perioperative experience, expectations, and pain management during stay. Patient verbalized understanding.

## 2017-07-23 NOTE — Progress Notes (Signed)
Dr. Buena Irish at bedside talking too spouse.

## 2017-07-23 NOTE — Anesthesia Post-Procedure Evaluation (Signed)
Department of Anesthesiology  Postprocedure Note    Patient: DEJEAN TRIBBY  MRN: [id number]  Birthdate: 1961/03/15  Date of evaluation: 07/23/2017  Time:  3:08 PM     Procedure Summary     Date:  07/23/17 Room / Location:  [facility] ENDO 05 / [facility]    Anesthesia Start:  3818 Anesthesia Stop:  2993    Procedures:       EGD WITH ESOPHAGEAL ENDOSCOPIC ULTRASOUND (N/A )      EGD BIOPSY (N/A Abdomen) Diagnosis:  (PANCREATITIS K85.9)    Surgeon:  York Spaniel, MD Responsible Provider:  Ubaldo Glassing, MD    Anesthesia Type:  TIVA ASA Status:  3          Anesthesia Type: TIVA    Aldrete Phase I: Aldrete Score: 8    Aldrete Phase II:      Last vitals: Reviewed and per EMR flowsheets.       Anesthesia Post Evaluation    Patient location during evaluation: PACU  Patient participation: complete - patient participated  Level of consciousness: awake  Airway patency: patent  Nausea & Vomiting: no vomiting  Complications: no  Cardiovascular status: hemodynamically stable  Respiratory status: acceptable  Hydration status: euvolemic

## 2017-07-23 NOTE — Progress Notes (Signed)
Discharge instructions reviewed with patient/responsible adult. All home medications have been reviewed, questions answered and patient and spouse verbalized understanding.  Discharge instructions signed and copies given. Patient discharged  Per w/c with belongings.

## 2017-07-23 NOTE — Progress Notes (Signed)
Teaching/ education completed for home care including pain management, diet,activity,safetty preacations and infection control. Patient and spouse verbalized understanding.

## 2017-07-23 NOTE — Op Note (Signed)
[facility]  Endoscopy Note    Patient: Phillip Harper   DOB: 1960/08/07  Acct#:     Procedure: Endoscopic ultrasound  Doppler of mesenteric vessels  EGD with biopsy    Date: 07/23/2017    Surgeon:  York Spaniel, MD    Referring Physician:  Dr. Scheryl Darter and Dr. Jones Skene    Anesthesia:  MAC per Anesthesia.    Indications: This is a 56 y.o. year old male who presents today with atrophic body and tail of pancreas with dilated duct.  Fullness in head of pancreas and a calcificaion.    Consent: Risks, benefits, and alternatives were explained and informed consent was obtained.      Monitoring:  Patient was monitored with continuous pulse oximetry, telemetry, and intermittent blood pressures.    Details of the Procedure:  The patient was then taken to the endoscopy suite. A time-out was performed. The patient and staff were in agreement as to the correct patient and procedure.  The above anesthesia was administered by the anesthesia department.  The patient was placed in the left lateral position.  The Olympus videoendoscope was placed in the patient's mouth and under direct visualization passed into the esophagus and advanced without difficulty to the 2nd portion of the duodenum.  Views were good, patient toleration was good.  Retroflexion was performed in the stomach.      Findings:  1. The esophagus appeared normal without evidence of Barrett's esophagus or reflux esophagitis.  2.  2 cm sliding hiatal hernia.  3.  There was mild gastritis.  Biopsies were obtained from the antrum and body of the stomach to evaluate for H. Pylori.  4.  Mild duodenal bulb duodenitis.  5.  Subtle narrowing in duodenal sweep with normal overlying mucosa.  6.  Normal 2nd portion of the duodenum.    Next, the curvilinear array echoendoscope was advanced without difficulty to the 2nd portion of the duodenum.  Endosonographic views were good, patient toleration was good.     Findings:   Major Papilla: Endoscopically, the  major papilla was normal.  Endosonographically, the major papilla appeared normal  Pancreas: The pancreatic duct had a few small stones in the duct near the papilla and gradually dilated with larger stones in the duct in the head of the pancreas.  The duct was dilated up to 1.25 cm in the body of the pancreas, and 0.51cm in the tail of the pancreas.  The pancreatic parenchyma was atrophic in the body and tail.  There were diffuse shadowing pancreatic calcifications.  Bile duct: The bile duct was 4.6m off the major papilla and 7.228min the common hepatic duct.  There were no shadowing stones or strictures.  Gallbladder: The gallbladder was normal.  Liver: The liver appeared echogenic consistent with fatty infiltration.    L adrenal: Normal.  Doppler evaluation of the celiac artery, splenic artery, hepatic artery, superior mesenteric artery, superior mesenteric vein, portal vein, and splenic vein was normal.    The duodenum and stomach were decompressed and the scope was withdrawn from the patient.  The patient tolerated the procedure well and was taken to the post anesthesia care unit in good condition.    Doppler Interpretation:  Doppler evaluation was performed and interpreted on the spot by the endoscopist without the presence of a radiologist.      Specimens taken: yes  Estimated blood loss: none      Impression:  1. 2cm sliding hiatal hernia  2.  Mild gastritis biopsied  3. Mild duodenitis biopsied  4.  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  Does have parenchyma and intraductal pancreatic stones.  No malignancy identified.    Recommendations:  1. Clear liquid diet advance as tolerated.    2.  Continue creon.  Follow up with Dr. Scheryl Darter.  3.  Would reserve ERCP for failure of creon to work.    4.  Avoid alcohol and smoking.    [place]

## 2017-07-23 NOTE — Discharge Instructions (Addendum)
ENDOSCOPY DISCHARGE INSTRUCTIONS     You may experience some lightheadedness for the next several hours.   Plan on quiet relaxation for the rest of today.   A responsible adult needs to stay with you today.   Because of the medications you received today-do not drive,operate machinery,or sign any contractual agreement for the next 24 hours.   Do not drink any alcoholic beverages or take any unprescribed medications tonight.   Eat bland food and avoid anything greasy or spicy initially-progress to your normal diet gradually.   Diet restrictions as instructed.   You may resume home medications as instructed.   It is not unusual to experience some mild cramping or gas pains, and you may not have a bowel movement for several days.   If you have any of the following problems, notify your physician or return to the hospital emergency room : fever, chills, excessive bleeding, excessive vomiting, difficulty swallowing, uncontrolled pain, increased abdominal distention, shortness of breath or any other problems.   If you had a polyp removed, avoid strenuous activity for 48 hours.Avoid the use of aspirin or related compounds for one week, unless otherwise instructed by your physician.  You may notice a small amount of blood in your next few bowel movements, but if a large amount passes, call your physician.   If you have a sore throat, you may use lozenges or salt water gargles.Call Dr. Scheryl Darter for any problems and to schedule followup appointment [phone].   follow up Friday December 21 @ 11 am Dr. Scheryl Darter  ANESTHESIA DISCHARGE INSTRUCTIONS     Wear your seatbelt home.   You are under the influence of drugs-do not drink alcohol,drive,operate machinery,or make any important decisions or sign any legal documentsfor 24 hours   A responsible adult needs to be with you for 24 hours.   You may experience lightheadedness,dizziness,or sleepiness following surgery.   Rest at home today- increase activity as  tolerated.   Progress slowly to a regular diet unless your physician has instructed you otherwise.Drink plenty of water.   If nausea becomes a problem call your physician.   Coughing,sore throat,and muscle aches are other side effects of anesthesia,and should improve with time.   Do not drive,operate machinery while taking narcotics.    Recommendations:  1. Clear liquid diet advance as tolerated.    2.  Continue creon.  Follow up with Dr. Scheryl Darter.  3.  Would reserve ERCP for failure of creon to work.    4.  Avoid alcohol and smoking.

## 2017-07-23 NOTE — Anesthesia Pre-Procedure Evaluation (Signed)
Department of Anesthesiology  Preprocedure Note       Name:  Phillip Harper   Age:  56 y.o.  DOB:  Sep 03, 1960                                          MRN:  [id number]         Date:  07/23/2017      Surgeon: Juliann Mule):  York Spaniel, MD    Procedure: EGD WITH ESOPHAGEAL ENDOSCOPIC ULTRASOUND (N/A )    Medications prior to admission:   Prior to Admission medications    Medication Sig Start Date End Date Taking? Authorizing Provider   metoprolol succinate (TOPROL XL) 50 MG extended release tablet TAKE 1 TABLET BY MOUTH EVERY NIGHT 07/21/17   Ignacia Felling, MD   Pancrelipase, Lip-Prot-Amyl, (CREON PO) Take by mouth    Historical Provider, MD   potassium chloride (KLOR-CON M) 10 MEQ extended release tablet Take 1 tablet by mouth 3 times daily 05/28/17   Ignacia Felling, MD   meloxicam (MOBIC) 7.5 MG tablet TAKE 1 TABLET BY MOUTH DAILY. 05/26/17   Ignacia Felling, MD   hydrALAZINE (APRESOLINE) 25 MG tablet TAKE 1 TABLET BY MOUTH EVERY 8 HOURS 05/23/17   Ignacia Felling, MD   allopurinol (ZYLOPRIM) 300 MG tablet TAKE 1 TABLET BY MOUTH EVERY DAY 05/23/17   Ignacia Felling, MD   amLODIPine (NORVASC) 5 MG tablet TAKE 1 TABLET BY MOUTH DAILY 05/05/17   Ignacia Felling, MD   simvastatin (ZOCOR) 40 MG tablet TAKE 1 TABLET BY MOUTH AT BEDTIME 05/05/17   Ignacia Felling, MD   metoprolol succinate (TOPROL XL) 50 MG extended release tablet TAKE 1 TABLET BY MOUTH EVERY NIGHT 04/16/17   Ignacia Felling, MD   Loperamide HCl (IMODIUM A-D PO) Take by mouth    Historical Provider, MD   indomethacin (INDOCIN) 25 MG capsule Take 25 mg by mouth as needed for Pain    Historical Provider, MD   hydrALAZINE (APRESOLINE) 25 MG tablet TAKE 1 TABLET BY MOUTH EVERY 8 HOURS 01/29/16   Ignacia Felling, MD   albuterol-ipratropium (COMBIVENT RESPIMAT) 20-100 MCG/ACT AERS inhaler Inhale 1 puff into the lungs every 6 hours 12/25/15   Tobie Poet, MD   metoprolol succinate (TOPROL XL) 50 MG extended release tablet TAKE 1 TABLET BY MOUTH EVERY NIGHT  10/02/15   Ignacia Felling, MD   colchicine (COLCRYS) 0.6 MG tablet 1 po prn for gout attack may repeat in 2 hours no more than 2/24 hrs 03/15/15   Ignacia Felling, MD   Vitamin D (CHOLECALCIFEROL) 1000 UNITS CAPS capsule Take 2,000 Units by mouth daily.    Historical Provider, MD       Current medications:    Current Facility-Administered Medications   Medication Dose Route Frequency Provider Last Rate Last Dose   . 0.9 % sodium chloride infusion   Intravenous Continuous Judie Bonus, MD       . sodium chloride flush 0.9 % injection 10 mL  10 mL Intravenous 2 times per day Judie Bonus, MD       . sodium chloride flush 0.9 % injection 10 mL  10 mL Intravenous PRN Judie Bonus, MD       . HYDROcodone-acetaminophen (NORCO) 5-325 MG per tablet 1 tablet  1 tablet Oral Once PRN Ubaldo Glassing, MD       . ondansetron [facility])  injection 4 mg  4 mg Intravenous Once PRN Ubaldo Glassing, MD       . meperidine (DEMEROL) injection 12.5 mg  12.5 mg Intravenous Q5 Min PRN Ubaldo Glassing, MD       . HYDROmorphone (DILAUDID) injection 0.5 mg  0.5 mg Intravenous Q5 Min PRN Ubaldo Glassing, MD       . HYDROmorphone (DILAUDID) injection 0.25 mg  0.25 mg Intravenous Q5 Min PRN Ubaldo Glassing, MD           Allergies:    Allergies   Allergen Reactions   . Shellfish-Derived Products Swelling       Problem List:    Patient Active Problem List   Diagnosis Code   . Gout M10.9   . Hyperlipidemia E78.5   . HTN (hypertension) I10   . Vitamin D deficiency E55.9   . Alcohol abuse F10.10   . Diarrhea R19.7   . Prostate CA ([place]) C61   . 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 Hyperglycemia R73.9   . 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 Cancer [facility]) 2016    Prostate [name]   . Clostridium difficile infection 08/01/2010   . Gout    . HTN (hypertension)    . Hyperlipidemia    . Keloids 12/2003    multiple keloids on head   . Pancreatitis, alcoholic 62/83/15-17/61/60   . Vitamin D deficiency         Past Surgical History:        Procedure Laterality Date   . COLONOSCOPY  02/04/13    Dr Fredonia Highland - normal repeat 2024   . COLONOSCOPY N/A 07/14/2017    COLONOSCOPY WITH BIOPSY performed by Waynard Reeds, MD at [place]   . OTHER SURGICAL HISTORY      KELOID SURGERY   . PROSTATE BIOPSY     . PROSTATECTOMY Bilateral 12/20/14    robotic assisted laparoscopic       Social History:    Social History   Substance Use Topics   . Smoking status: Never Smoker   . Smokeless tobacco: Never Used   . Alcohol use No      Comment: has not been drinking for about 3-4 months counseling and AA etc                                 Counseling given: Not Answered      Vital Signs (Current): There were no vitals filed for this visit.                                           BP Readings from Last 3 Encounters:   07/14/17 121/85   07/14/17 130/88   06/23/17 (!) 158/82       NPO Status:  BMI:   Wt Readings from Last 3 Encounters:   07/14/17 220 lb (99.8 kg)   06/23/17 225 lb (102.1 kg)   05/26/17 223 lb 6.4 oz (101.3 kg)     There is no height or weight on file to calculate BMI.    CBC:   Lab Results   Component Value Date    WBC 6.3 05/26/2017    RBC 3.73 05/26/2017    HGB 13.6 05/26/2017    HCT 38.6 05/26/2017    MCV 103.3 05/26/2017    RDW 14.4 05/26/2017    PLT 181 05/26/2017       CMP:   Lab Results   Component Value Date    NA 140 05/26/2017    K 2.9 05/26/2017    CL 97 05/26/2017    CO2 28 05/26/2017    BUN <2 05/26/2017    CREATININE 0.8 05/26/2017    GFRAA >60 05/26/2017    GFRAA >60 11/08/2011    AGRATIO 0.8 05/26/2017    LABGLOM >60 05/26/2017    LABGLOM 95 12/06/2009    GLUCOSE 96 05/26/2017    GLUCOSE 89 12/06/2009    PROT 8.3 05/26/2017    PROT 8.8 05/08/2011    CALCIUM 8.5 05/26/2017    BILITOT 0.6 05/26/2017    ALKPHOS 109 05/26/2017    AST 79 05/26/2017    ALT 37 05/26/2017       POC Tests: No results for input(s): POCGLU, POCNA, POCK,  POCCL, POCBUN, POCHEMO, POCHCT in the last 72 hours.    Coags:   Lab Results   Component Value Date    PROTIME 14.2 07/27/2010    INR 1.1 07/27/2010       HCG (If Applicable): No results found for: PREGTESTUR, PREGSERUM, HCG, HCGQUANT     ABGs:   Lab Results   Component Value Date    PHART 7.44 07/27/2010    PO2ART 67 07/27/2010    PCO2ART 33 07/27/2010    HCO3ART 22 07/27/2010    BEART -1.0 07/27/2010        Type & Screen (If Applicable):  No results found for: LABABO, [place]    Anesthesia Evaluation  Patient summary reviewed no history of anesthetic complications:   Airway: Mallampati: II  TM distance: >3 FB   Neck ROM: full  Mouth opening: > = 3 FB Dental: normal exam         Pulmonary: breath sounds clear to auscultation      (-) rhonchi and wheezes                          ROS comment: Has not used inhaler in several months   Cardiovascular:  Exercise tolerance: good (>4 METS),   (+) hypertension:, hyperlipidemia    (-) CABG/stent, dysrhythmias and  angina      Rhythm: regular  Rate: normal                    Neuro/Psych:      (-) seizures, TIA and CVA           GI/Hepatic/Renal:            ROS comment: Denies n/v or GERD symptoms today  H/o alcoholic pancreatitis.   Endo/Other:    (+) malignancy/cancer (prostate).                 Abdominal:           Vascular:  Anesthesia Plan      TIVA     ASA 3     (Patient verbalizes understanding that there is the possibility of recall with TIVA.  Patient verbalizes his wishes to proceed with planned anesthetic.)  Induction: intravenous.      Anesthetic plan and risks discussed with patient.      Plan discussed with CRNA.                  Everlene Farrier, MD   07/23/2017

## 2017-07-23 NOTE — H&P (Signed)
[facility]   Pre-operative History and Physical    Patient: Phillip Harper  DOB: 1960/11/16  Acct#:     HISTORY OF PRESENT ILLNESS:    The patient is a 56 y.o. male who presents with atrophic body and tail of pancreas with dilated duct.  Fullness in head of pancreas and a calcificaion.    Past Medical History:        Diagnosis Date   . Cancer [facility]) 2016    Prostate [name]   . Clostridium difficile infection 08/01/2010   . Gout    . HTN (hypertension)    . Hyperlipidemia    . Keloids 12/2003    multiple keloids on head   . Pancreatitis, alcoholic 13/08/65-78/46/96   . Vitamin D deficiency       Past Surgical History:        Procedure Laterality Date   . COLONOSCOPY  02/04/13    Dr Fredonia Highland - normal repeat 2024   . COLONOSCOPY N/A 07/14/2017    COLONOSCOPY WITH BIOPSY performed by Waynard Reeds, MD at [place]   . OTHER SURGICAL HISTORY      KELOID SURGERY   . PROSTATE BIOPSY     . PROSTATECTOMY Bilateral 12/20/14    robotic assisted laparoscopic      Medications Prior to Admission:   No current facility-administered medications on file prior to encounter.      Current Outpatient Prescriptions on File Prior to Encounter   Medication Sig Dispense Refill   . Pancrelipase, Lip-Prot-Amyl, (CREON PO) Take by mouth     . potassium chloride (KLOR-CON M) 10 MEQ extended release tablet Take 1 tablet by mouth 3 times daily 90 tablet 1   . meloxicam (MOBIC) 7.5 MG tablet TAKE 1 TABLET BY MOUTH DAILY. 90 tablet 0   . hydrALAZINE (APRESOLINE) 25 MG tablet TAKE 1 TABLET BY MOUTH EVERY 8 HOURS 270 tablet 0   . allopurinol (ZYLOPRIM) 300 MG tablet TAKE 1 TABLET BY MOUTH EVERY DAY 90 tablet 0   . amLODIPine (NORVASC) 5 MG tablet TAKE 1 TABLET BY MOUTH DAILY 90 tablet 2   . simvastatin (ZOCOR) 40 MG tablet TAKE 1 TABLET BY MOUTH AT BEDTIME 90 tablet 2   . metoprolol succinate (TOPROL XL) 50 MG extended release tablet TAKE 1 TABLET BY MOUTH EVERY NIGHT 90 tablet 0   . Loperamide HCl (IMODIUM A-D PO) Take by  mouth     . indomethacin (INDOCIN) 25 MG capsule Take 25 mg by mouth as needed for Pain     . hydrALAZINE (APRESOLINE) 25 MG tablet TAKE 1 TABLET BY MOUTH EVERY 8 HOURS 270 tablet 0   . albuterol-ipratropium (COMBIVENT RESPIMAT) 20-100 MCG/ACT AERS inhaler Inhale 1 puff into the lungs every 6 hours 1 Inhaler 0   . metoprolol succinate (TOPROL XL) 50 MG extended release tablet TAKE 1 TABLET BY MOUTH EVERY NIGHT 90 tablet 0   . colchicine (COLCRYS) 0.6 MG tablet 1 po prn for gout attack may repeat in 2 hours no more than 2/24 hrs 10 tablet 1   . Vitamin D (CHOLECALCIFEROL) 1000 UNITS CAPS capsule Take 2,000 Units by mouth daily.          Allergies:  Shellfish-derived products    Social History:   Social History     Social History   . Marital status: Legally Separated     Spouse name: N/A   . Number of children: N/A   .  Years of education: N/A     Occupational History   . Not on file.     Social History Main Topics   . Smoking status: Never Smoker   . Smokeless tobacco: Never Used   . Alcohol use No      Comment: has not been drinking for about 3-4 months counseling and AA etc    . Drug use: No   . Sexual activity: Yes     Partners: Female     Other Topics Concern   . Not on file     Social History Narrative   . No narrative on file      Family History:   Family History   Problem Relation Age of Onset   . Cancer Mother         lung CA   . Heart Disease Father         CAD   . Cancer Sister         lung CA        PHYSICAL EXAM:      BP 125/78   Pulse 80   Temp 99 F (37.2 C) (Temporal)   Resp 16   Ht 5\' 9"  (1.753 m)   Wt 209 lb (94.8 kg)   SpO2 99%   BMI 30.86 kg/m  I        Heart:  RRR    Lungs:  CTA b    Abdomen:  S/NT/ND/+BS      ASSESSMENT AND PLAN:  ASA: per anesthesia  Mallampati: per anesthesia  1.  Patient is a 55 y.o. male here for EGD and EUS   2.  Procedure options, risks and benefits reviewed with the patient.  The patient expresses understanding.    [place]

## 2017-07-23 NOTE — Progress Notes (Signed)
Reviewed problem list, assessment, H&P note and checklist preoperatively.Scope verified using 2 person system.  Family in waiting room.

## 2017-07-23 NOTE — Progress Notes (Signed)
Received from endo,drowsy,room air,denies pain,abdomen soft non tender,warm blanket,vss.

## 2017-07-23 NOTE — Progress Notes (Signed)
Phase 2 - awake,consuming po intake tolerating well,denies pain,spouse at bedside,vss.

## 2017-08-11 MED ORDER — POTASSIUM CHLORIDE CRYS ER 10 MEQ PO TBCR
10 MEQ | ORAL_TABLET | ORAL | 0 refills | Status: DC
Start: 2017-08-11 — End: 2017-10-28

## 2017-08-20 MED ORDER — ALLOPURINOL 300 MG PO TABS
300 MG | ORAL_TABLET | ORAL | 0 refills | Status: DC
Start: 2017-08-20 — End: 2017-11-18

## 2017-08-20 MED ORDER — HYDRALAZINE HCL 25 MG PO TABS
25 MG | ORAL_TABLET | ORAL | 0 refills | Status: DC
Start: 2017-08-20 — End: 2017-08-26

## 2017-08-25 MED ORDER — MELOXICAM 7.5 MG PO TABS
7.5 MG | ORAL_TABLET | ORAL | 1 refills | Status: DC
Start: 2017-08-25 — End: 2017-08-26

## 2017-08-26 ENCOUNTER — Ambulatory Visit
Admit: 2017-08-26 | Discharge: 2017-08-26 | Payer: BLUE CROSS/BLUE SHIELD | Attending: Internal Medicine | Primary: Internal Medicine

## 2017-08-26 DIAGNOSIS — I10 Essential (primary) hypertension: Secondary | ICD-10-CM

## 2017-08-26 NOTE — Assessment & Plan Note (Signed)
Stable continue current meds and return in 3 mo.

## 2017-08-26 NOTE — Progress Notes (Signed)
Subjective:      Patient ID: Phillip Harper is a 57 y.o. male.    HPI  Here today for follow up of chronic problems as per HPI and as problems listed under assessment and plan,no new c/o feels good       Hypertension   Pertinent negatives include no chest pain, headaches, palpitations or shortness of breath.   Hyperlipidemia   Pertinent negatives include no chest pain or shortness of breath.       Allergies   Allergen Reactions   . Shellfish-Derived Products Swelling       Current Outpatient Prescriptions   Medication Sig Dispense Refill   . budesonide (ENTOCORT EC) 3 MG extended release capsule Three capsules daily     . budesonide (ENTOCORT EC) 3 MG extended release capsule Take 3 capsules by mouth daily as needed  2   . allopurinol (ZYLOPRIM) 300 MG tablet TAKE 1 TABLET BY MOUTH EVERY DAY 90 tablet 0   . potassium chloride (KLOR-CON M) 10 MEQ extended release tablet TAKE 1 TABLET BY MOUTH THREE TIMES DAILY 90 tablet 0   . Pancrelipase, Lip-Prot-Amyl, (CREON PO) Take by mouth     . amLODIPine (NORVASC) 5 MG tablet TAKE 1 TABLET BY MOUTH DAILY 90 tablet 2   . simvastatin (ZOCOR) 40 MG tablet TAKE 1 TABLET BY MOUTH AT BEDTIME 90 tablet 2   . Loperamide HCl (IMODIUM A-D PO) Take by mouth     . indomethacin (INDOCIN) 25 MG capsule Take 25 mg by mouth as needed for Pain     . hydrALAZINE (APRESOLINE) 25 MG tablet TAKE 1 TABLET BY MOUTH EVERY 8 HOURS 270 tablet 0   . albuterol-ipratropium (COMBIVENT RESPIMAT) 20-100 MCG/ACT AERS inhaler Inhale 1 puff into the lungs every 6 hours 1 Inhaler 0   . metoprolol succinate (TOPROL XL) 50 MG extended release tablet TAKE 1 TABLET BY MOUTH EVERY NIGHT 90 tablet 0   . colchicine (COLCRYS) 0.6 MG tablet 1 po prn for gout attack may repeat in 2 hours no more than 2/24 hrs 10 tablet 1   . Vitamin D (CHOLECALCIFEROL) 1000 UNITS CAPS capsule Take 2,000 Units by mouth daily.       No current facility-administered medications for this visit.        Past Medical History:   Diagnosis Date    . Cancer [facility]) 2016    Prostate [name]   . Clostridium difficile infection 08/01/2010   . Gout    . HTN (hypertension)    . Hyperlipidemia    . Keloids 12/2003    multiple keloids on head   . Pancreatitis, alcoholic 56/38/75-64/33/29   . Vitamin D deficiency        Family History   Problem Relation Age of Onset   . Cancer Mother         lung CA   . Heart Disease Father         CAD   . Cancer Sister         lung CA       Past Surgical History:   Procedure Laterality Date   . COLONOSCOPY  02/04/13    Dr Fredonia Highland - normal repeat 2024   . COLONOSCOPY N/A 07/14/2017    COLONOSCOPY WITH BIOPSY performed by Waynard Reeds, MD at [place]   . OTHER SURGICAL HISTORY      KELOID SURGERY   . PROSTATE BIOPSY     . PROSTATECTOMY Bilateral 12/20/14  robotic assisted laparoscopic   . UPPER GASTROINTESTINAL ENDOSCOPY N/A 07/23/2017    EGD WITH ESOPHAGEAL ENDOSCOPIC ULTRASOUND performed by York Spaniel, MD at [place]   . UPPER GASTROINTESTINAL ENDOSCOPY N/A 07/23/2017    EGD BIOPSY performed by York Spaniel, MD at [place] History   . Marital status: Legally Separated     Spouse name: N/A   . Number of children: N/A   . Years of education: N/A     Occupational History   . Not on file.     Social History Main Topics   . Smoking status: Never Smoker   . Smokeless tobacco: Never Used   . Alcohol use No      Comment: has not been drinking for about 3-4 months counseling and AA etc    . Drug use: No   . Sexual activity: Yes     Partners: Female     Other Topics Concern   . Not on file     Social History Narrative   . No narrative on file       Review of Systems  Review of Systems   Constitutional: Positive for unexpected weight change (up a few# ).   HENT: Positive for congestion and rhinorrhea.    Eyes: Negative.         Glasses   Respiratory: Negative.  Negative for choking, shortness of breath and wheezing.    Cardiovascular: Negative.  Negative for chest pain and  palpitations.   Gastrointestinal: Positive for diarrhea (lymph. colitis improved ). Negative for blood in stool and vomiting.   Endocrine: Negative.  Negative for polydipsia, polyphagia and polyuria.   Genitourinary: Positive for frequency and urgency.        Nocturia -s/p prostate Ca check labs    Musculoskeletal: Negative.         Gout is stable since taking allopurinol    Skin: Negative.         Acne keloidas nuchae back of neck as noted    Allergic/Immunologic: Negative.    Neurological: Negative.  Negative for headaches.   Hematological: Negative.    Psychiatric/Behavioral: Negative.        Objective:   Physical Exam:  Physical Exam   Constitutional: He is oriented to person, place, and time. He appears well-developed and well-nourished.   HENT:   Head: Normocephalic and atraumatic.   Nose: Nose normal.   Mouth/Throat: Oropharynx is clear and moist.   Eyes: Conjunctivae and EOM are normal.   Neck: Normal range of motion. Neck supple. No tracheal deviation present. No thyromegaly present.   Cardiovascular: Normal rate, regular rhythm, normal heart sounds and intact distal pulses.    No murmur heard.  Pulmonary/Chest: Effort normal and breath sounds normal.   Abdominal: Soft. Bowel sounds are normal.   obese   Genitourinary:   Genitourinary Comments: Per [name]   Musculoskeletal: Normal range of motion. Tenderness: low back improved    Neurological: He is alert and oriented to person, place, and time. He has normal reflexes.   Skin: Skin is warm and dry.   Acne keloidas nuchae back of neck as noted    Psychiatric: He has a normal mood and affect. His behavior is normal.       BP 118/78 (Site: Right Upper Arm, Position: Sitting, Cuff Size: Medium Adult)   Pulse 78   Resp 14   Ht 5\' 9"  (  1.753 m)   Wt 222 lb 9.6 oz (101 kg)   BMI 32.87 kg/m       Assessment & Plan:         Gout  No recent symptoms meds and diet as noted     Hyperlipidemia  Stable continue current meds and return in 3 mo.        HTN  (hypertension)  Stable continue current meds and return in 3 mo.        Vitamin D deficiency  Continue current meds     Alcohol abuse  Cont to drink but has decreased as noted  6 beers /week     Lymphocytic colitis  Diarrhea improved with meds ie Imodium,Creon and Budesanide     Prostate CA ([place])  Stable as noted     Hyperglycemia  Diet controled     Appendix disease  As per CT has seen Dr Rodney Booze may need removal in future

## 2017-08-26 NOTE — Assessment & Plan Note (Signed)
Cont to drink but has decreased as noted  6 beers /week

## 2017-08-26 NOTE — Assessment & Plan Note (Signed)
As per CT has seen Dr Rodney Booze may need removal in future

## 2017-08-26 NOTE — Assessment & Plan Note (Signed)
Continue current meds

## 2017-08-26 NOTE — Assessment & Plan Note (Signed)
Stable as noted

## 2017-08-26 NOTE — Assessment & Plan Note (Signed)
No recent symptoms meds and diet as noted

## 2017-08-26 NOTE — Assessment & Plan Note (Signed)
Diet controled

## 2017-08-26 NOTE — Assessment & Plan Note (Signed)
Diarrhea improved with meds ie Imodium,Creon and Budesanide

## 2017-08-29 ENCOUNTER — Encounter

## 2017-08-30 LAB — COMPREHENSIVE METABOLIC PANEL
ALT: 26 U/L (ref 10–40)
AST: 49 U/L — ABNORMAL HIGH (ref 15–37)
Albumin/Globulin Ratio: 1.3 (ref 1.1–2.2)
Albumin: 4.2 g/dL (ref 3.4–5.0)
Alkaline Phosphatase: 98 U/L (ref 40–129)
Anion Gap: 15 (ref 3–16)
BUN: 4 mg/dL — ABNORMAL LOW (ref 7–20)
CO2: 26 mmol/L (ref 21–32)
Calcium: 8.7 mg/dL (ref 8.3–10.6)
Chloride: 103 mmol/L (ref 99–110)
Creatinine: 0.6 mg/dL — ABNORMAL LOW (ref 0.9–1.3)
GFR African American: >60 (ref >60)
GFR Non-African American: >60 (ref >60)
Globulin: 3.2 g/dL
Glucose: 92 mg/dL (ref 70–99)
Potassium: 4.4 mmol/L (ref 3.5–5.1)
Sodium: 144 mmol/L (ref 136–145)
Total Bilirubin: 0.7 mg/dL (ref 0.0–1.0)
Total Protein: 7.4 g/dL (ref 6.4–8.2)

## 2017-08-30 LAB — CBC WITH AUTO DIFFERENTIAL
Basophils %: 0.4 %
Basophils Absolute: 0 10*3/uL (ref 0.0–0.2)
Eosinophils %: 0.2 %
Eosinophils Absolute: 0 10*3/uL (ref 0.0–0.6)
Hematocrit: 37.3 % — ABNORMAL LOW (ref 40.5–52.5)
Hemoglobin: 12.6 g/dL — ABNORMAL LOW (ref 13.5–17.5)
Lymphocytes %: 18.4 %
Lymphocytes Absolute: 1 10*3/uL (ref 1.0–5.1)
MCH: 36.9 pg — ABNORMAL HIGH (ref 26.0–34.0)
MCHC: 33.9 g/dL (ref 31.0–36.0)
MCV: 108.6 fL — ABNORMAL HIGH (ref 80.0–100.0)
MPV: 8.1 fL (ref 5.0–10.5)
Monocytes %: 6.2 %
Monocytes Absolute: 0.3 10*3/uL (ref 0.0–1.3)
Neutrophils %: 74.8 %
Neutrophils Absolute: 4.1 10*3/uL (ref 1.7–7.7)
Platelets: 182 10*3/uL (ref 135–450)
RBC: 3.43 M/uL — ABNORMAL LOW (ref 4.20–5.90)
RDW: 15 % (ref 12.4–15.4)
WBC: 5.5 10*3/uL (ref 4.0–11.0)

## 2017-08-30 LAB — LIPID PANEL
Cholesterol, Total: 184 mg/dL (ref 0–199)
HDL: 114 mg/dL — ABNORMAL HIGH (ref 40–60)
LDL Calculated: 57 mg/dL (ref <100)
Triglycerides: 65 mg/dL (ref 0–150)
VLDL Cholesterol Calculated: 13 mg/dL

## 2017-08-30 LAB — HEMOGLOBIN A1C
Hemoglobin A1C: 5.3 %
eAG: 105.4 mg/dL

## 2017-08-30 LAB — TSH WITH REFLEX: TSH: 0.99 u[IU]/mL (ref 0.27–4.20)

## 2017-08-30 LAB — URIC ACID: Uric Acid, Serum: 4.3 mg/dL (ref 3.5–7.2)

## 2017-09-01 NOTE — Progress Notes (Signed)
Name_______________________________________Printed:____________________  Date and time of surgery__2-7-19______________________Arrival Time:_0600 MAIN_______________   1. Do not eat or drink anything after 11 midnight (or____hours) prior to surgery. This includes no water, chewing gum or mints.Endoscopy patients follow your doctors bowel prep instructions,which may include taking part of prep after midnight.   2. Take the following pills with a small sip of water on the morning of surgery_AMLODIPINE, APRESOLINE, ENTOCORT ____________________________________________________   3. Aspirin, Ibuprofen, Advil, Naproxen, Vitamin E and other Anti-inflammatory products should be stopped for 7 days before surgery or as directed by your physician.   4. Check with your Doctor regarding stopping Plavix, Coumadin,Eliquis, Lovenox,Effient,Pradaxa,Xarelto, Fragmin or other blood thinners and follow their instructions.   5. Do not smoke, and do not drink any alcoholic beverages 24 hours prior to surgery.  This includes NA Beer.Refrain from the usage of any recreational drugs.   6. You may brush your teeth and gargle the morning of surgery.  DO NOT SWALLOW WATER   7. You MUST make arrangements for a responsible adult to stay on site while you are here and take you home after your surgery. You will not be allowed to leave alone or drive yourself home.  It is strongly suggested someone stay with you the first 24 hrs. Your surgery will be cancelled if you do not have a ride home.   8. A parent/legal guardian must accompany a child scheduled for surgery and plan to stay at the hospital until the child is discharged.  Please do not bring other children with you.   9. Please wear simple, loose fitting clothing to the hospital.  Do not bring valuables (money, credit cards, checkbooks, etc.) Do not wear any makeup (including no eye makeup) or nail polish on your fingers or toes.             10. DO NOT wear any jewelry or piercings on day of  surgery.  All body piercing jewelry must be removed.             11. If you have ___dentures, they will be removed before going to the OR; we will provide you a container.  If you wear ___contact lenses or ___glasses, they will be removed; please bring a case for them.             12. Please see your family doctor/pediatrician for a history & physical and/or concerning medications.  Bring any test results/reports from your physician's office.   PCP__________________Phone___________H&P Appt. Date________             13 If you  have a Living Will and Durable Power of Attorney for Healthcare, please bring in a copy.             19. Notify your Surgeon if you develop any illness between now and surgery  time, cough, cold, fever, sore throat, nausea, vomiting, etc.  Please notify your surgeon if you experience dizziness, shortness of breath or blurred vision between now & the time of your surgery             15. DO NOT shave your operative site 96 hours prior to surgery. For face & neck surgery, men may use an electric razor 48 hours prior to surgery.             16. Shower the night before surgery with ___Antibacterial soap ___Hibiclens             17. To provide excellent care visitors will be limited to  one in the room at any given time.             18.  Please bring picture ID and insurance card.             19.  Visit our web site for additional information:  [email or web address]              20.During flu season no children under the age of 35 are permitted in the hospital for the safety of all patients.                              21. If you take a long acting insulin in the evening only  take half of your usual  dose the night  before your procedure              22. If you use a c-pap please bring DOS if staying overnight,             23.For your convenience [facility] has a pharmacy on site to fill your prescriptions.             24. If you use oxygen and have a portable tank please bring it  with you the DOS              25. Bring a complete list of all your medications with name and dose include any supplements.             26. Other__________________________________________   *Please call pre admission testing if you any further questions   [place]         [place]   [place]    [place]  [phone]   [place]       All above information reviewed with patient in person or by phone.Patient verbalizes understanding.All questions and concerns addressed.                                                                                                 Patient/Rep____________________                                                                                                                                    PRE OP INSTRUCTIONS

## 2017-09-11 ENCOUNTER — Inpatient Hospital Stay: Payer: BLUE CROSS/BLUE SHIELD

## 2017-09-11 MED ORDER — SODIUM CHLORIDE 0.9 % IV SOLN
0.9 % | INTRAVENOUS | Status: DC | PRN
Start: 2017-09-11 — End: 2017-09-11
  Administered 2017-09-11: 12:00:00 via INTRAVENOUS

## 2017-09-11 MED ORDER — MIDAZOLAM HCL 2 MG/2ML IJ SOLN
2 MG/ML | INTRAMUSCULAR | Status: DC | PRN
Start: 2017-09-11 — End: 2017-09-11
  Administered 2017-09-11: 12:00:00 2 via INTRAVENOUS

## 2017-09-11 MED ORDER — PROPOFOL 200 MG/20ML IV EMUL
200 MG/20ML | INTRAVENOUS | Status: DC | PRN
Start: 2017-09-11 — End: 2017-09-11
  Administered 2017-09-11: 13:00:00 150 via INTRAVENOUS
  Administered 2017-09-11: 13:00:00 20 via INTRAVENOUS
  Administered 2017-09-11: 13:00:00 30 via INTRAVENOUS

## 2017-09-11 MED ORDER — CEFAZOLIN SODIUM-DEXTROSE 2-4 GM/100ML-% IV SOLN
2-4 GM/100ML-% | INTRAVENOUS | Status: AC
Start: 2017-09-11 — End: 2017-09-11
  Administered 2017-09-11: 12:00:00 2 via INTRAVENOUS

## 2017-09-11 MED ORDER — LABETALOL HCL 5 MG/ML IV SOLN
5 MG/ML | INTRAVENOUS | Status: DC | PRN
Start: 2017-09-11 — End: 2017-09-11

## 2017-09-11 MED ORDER — FENTANYL CITRATE (PF) 250 MCG/5ML IJ SOLN
250 MCG/5ML | INTRAMUSCULAR | Status: DC | PRN
Start: 2017-09-11 — End: 2017-09-11
  Administered 2017-09-11: 13:00:00 100 via INTRAVENOUS
  Administered 2017-09-11 (×3): 50 via INTRAVENOUS

## 2017-09-11 MED ORDER — CEFAZOLIN SODIUM-DEXTROSE 2-4 GM/100ML-% IV SOLN
2-4 GM/100ML-% | Freq: Once | INTRAVENOUS | Status: AC
Start: 2017-09-11 — End: 2017-09-11

## 2017-09-11 MED ORDER — LACTATED RINGERS IR SOLN
Freq: Once | Status: AC | PRN
Start: 2017-09-11 — End: 2017-09-11
  Administered 2017-09-11: 13:00:00 1000

## 2017-09-11 MED ORDER — ONDANSETRON HCL 4 MG/2ML IJ SOLN
4 MG/2ML | Freq: Once | INTRAMUSCULAR | Status: DC | PRN
Start: 2017-09-11 — End: 2017-09-11

## 2017-09-11 MED ORDER — OXYCODONE-ACETAMINOPHEN 5-325 MG PO TABS
5-325 MG | Freq: Once | ORAL | Status: DC | PRN
Start: 2017-09-11 — End: 2017-09-11

## 2017-09-11 MED ORDER — OXYCODONE-ACETAMINOPHEN 5-325 MG PO TABS
5-325 MG | ORAL_TABLET | ORAL | 0 refills | Status: AC | PRN
Start: 2017-09-11 — End: 2017-09-18

## 2017-09-11 MED ORDER — CEFAZOLIN SODIUM-DEXTROSE 2-4 GM/100ML-% IV SOLN
2-4 GM/100ML-% | INTRAVENOUS | Status: DC
Start: 2017-09-11 — End: 2017-09-11

## 2017-09-11 MED ORDER — BUPIVACAINE HCL 0.5 % IJ SOLN
0.5 % | Freq: Once | INTRAMUSCULAR | Status: AC | PRN
Start: 2017-09-11 — End: 2017-09-11
  Administered 2017-09-11: 13:00:00 16 via INTRADERMAL

## 2017-09-11 MED ORDER — ONDANSETRON HCL 4 MG/2ML IJ SOLN
4 MG/2ML | INTRAMUSCULAR | Status: DC | PRN
Start: 2017-09-11 — End: 2017-09-11
  Administered 2017-09-11: 13:00:00 4 via INTRAVENOUS

## 2017-09-11 MED ORDER — SUGAMMADEX SODIUM 500 MG/5ML IV SOLN
500 MG/5ML | INTRAVENOUS | Status: DC | PRN
Start: 2017-09-11 — End: 2017-09-11
  Administered 2017-09-11: 13:00:00 200 via INTRAVENOUS

## 2017-09-11 MED ORDER — DEXAMETHASONE SODIUM PHOSPHATE 20 MG/5ML IJ SOLN
20 MG/5ML | INTRAMUSCULAR | Status: DC | PRN
Start: 2017-09-11 — End: 2017-09-11
  Administered 2017-09-11: 13:00:00 8 via INTRAVENOUS

## 2017-09-11 MED ORDER — MEPERIDINE HCL 25 MG/ML IJ SOLN
25 MG/ML | INTRAMUSCULAR | Status: DC | PRN
Start: 2017-09-11 — End: 2017-09-11

## 2017-09-11 MED ORDER — PHENYLEPHRINE HCL 10 MG/ML IJ SOLN
10 MG/ML | INTRAMUSCULAR | Status: DC | PRN
Start: 2017-09-11 — End: 2017-09-11
  Administered 2017-09-11: 13:00:00 100 via INTRAVENOUS

## 2017-09-11 MED ORDER — SODIUM CHLORIDE 0.9 % IR SOLN
0.9 % | Status: AC | PRN
Start: 2017-09-11 — End: 2017-09-11
  Administered 2017-09-11: 13:00:00 1000

## 2017-09-11 MED ORDER — HYDROMORPHONE HCL 2 MG/ML IJ SOLN
2 MG/ML | INTRAMUSCULAR | Status: DC | PRN
Start: 2017-09-11 — End: 2017-09-11
  Administered 2017-09-11 (×3): 0.5 mg via INTRAVENOUS

## 2017-09-11 MED ORDER — SUCCINYLCHOLINE CHLORIDE 20 MG/ML IJ SOLN
20 MG/ML | INTRAMUSCULAR | Status: DC | PRN
Start: 2017-09-11 — End: 2017-09-11
  Administered 2017-09-11: 13:00:00 140 via INTRAVENOUS

## 2017-09-11 MED ORDER — ROCURONIUM BROMIDE 50 MG/5ML IV SOLN
50 MG/5ML | INTRAVENOUS | Status: DC | PRN
Start: 2017-09-11 — End: 2017-09-11
  Administered 2017-09-11: 13:00:00 30 via INTRAVENOUS
  Administered 2017-09-11 (×2): 10 via INTRAVENOUS

## 2017-09-11 MED ORDER — LIDOCAINE HCL (PF) 2 % IJ SOLN
2 % | INTRAMUSCULAR | Status: DC | PRN
Start: 2017-09-11 — End: 2017-09-11
  Administered 2017-09-11: 13:00:00 100 via INTRAVENOUS

## 2017-09-11 MED ORDER — HYDRALAZINE HCL 20 MG/ML IJ SOLN
20 MG/ML | INTRAMUSCULAR | Status: DC | PRN
Start: 2017-09-11 — End: 2017-09-11

## 2017-09-11 MED FILL — HYDROMORPHONE HCL 2 MG/ML IJ SOLN: 2 mg/mL | INTRAMUSCULAR | Qty: 1

## 2017-09-11 MED FILL — CEFAZOLIN SODIUM-DEXTROSE 2-4 GM/100ML-% IV SOLN: 2-4 GM/100ML-% | INTRAVENOUS | Qty: 100

## 2017-09-11 NOTE — Progress Notes (Signed)
Teaching / education initiated regarding perioperative experience, expectations, and pain management during stay. Patient verbalized understanding.

## 2017-09-11 NOTE — H&P (Addendum)
Phillip Harper is an 57 y.o. African [place] male.    Past Medical History:   Diagnosis Date   . Alcoholism in recovery ([place]) 08/01/2017   . Cancer [facility]) 2016    Prostate [name]   . Clostridium difficile infection 08/01/2010   . Gout    . HTN (hypertension)    . Hyperlipidemia    . Keloids 12/2003    multiple keloids on head   . Pancreatitis, alcoholic 33/29/51-88-4166   . Vitamin D deficiency        Allergies:   Allergies   Allergen Reactions   . Shellfish-Derived Products Swelling     ORAL EDEMA, ORAL ITCHING       Medications-Entocort, Zyloprim, Klor con, Creon, Norvasc, Zocor, indocin, Hydralazine, combivent, Topro    Family History   Problem Relation Age of Onset   . Cancer Mother         lung CA   . Heart Disease Father         CAD   . Cancer Sister         lung CA       Past Medical History:   Diagnosis Date   . Alcoholism in recovery ([place]) 08/01/2017   . Cancer [facility]) 2016    Prostate [name]   . Clostridium difficile infection 08/01/2010   . Gout    . HTN (hypertension)    . Hyperlipidemia    . Keloids 12/2003    multiple keloids on head   . Pancreatitis, alcoholic 02/02/15-08-930   . Vitamin D deficiency        Past Surgical History:   Procedure Laterality Date   . COLONOSCOPY  02/04/13    Dr Fredonia Highland - normal repeat 2024   . COLONOSCOPY N/A 07/14/2017    COLONOSCOPY WITH BIOPSY performed by Waynard Reeds, MD at [place]   . OTHER SURGICAL HISTORY      KELOID SURGERY   . PROSTATE BIOPSY     . PROSTATECTOMY Bilateral 12/20/14    robotic assisted laparoscopic   . UPPER GASTROINTESTINAL ENDOSCOPY N/A 07/23/2017    EGD WITH ESOPHAGEAL ENDOSCOPIC ULTRASOUND performed by York Spaniel, MD at [place]   . UPPER GASTROINTESTINAL ENDOSCOPY N/A 07/23/2017    EGD BIOPSY performed by York Spaniel, MD at [facility]       Active Problems:    * No active hospital problems. *  Resolved Problems:    * No resolved hospital problems. *    Blood pressure 139/71, pulse 69, temperature 97.6  F (36.4 C), temperature source Temporal, resp. rate 18, height 5\' 9"  (1.753 m), weight 222 lb (100.7 kg), SpO2 97 %.    Review of Systems    Physical Exam   Cardiovascular: Normal rate and regular rhythm.    Pulmonary/Chest: Effort normal and breath sounds normal.       Assessment:  appendicitis    Plan:  Lap appy    Elly [place]ena, MD  09/11/2017

## 2017-09-11 NOTE — Progress Notes (Signed)
Pt discharged home per private vehicle with a  responsible adult who states they will be with them for the next 24 hours.  Wheeled to front of the hospital by Kristen PCA.

## 2017-09-11 NOTE — Progress Notes (Signed)
Pt arrived to me from PACU to a same day surgery bay for phase 2 recovery monitoring and care prior to d/c in good condition.  Pt is alert and oriented X4.  Report received from [place], phase 1 recovery nurse.    S/p appendectomy, lap surgery      Numair Masden Megan Salon RN, BSN, [facility]  Pre-Op/Recovery   Same Day Surgery

## 2017-09-11 NOTE — Anesthesia Pre-Procedure Evaluation (Signed)
Department of Anesthesiology  Preprocedure Note       Name:  Phillip Harper   Age:  57 y.o.  DOB:  Dec 29, 1960                                          MRN:  [id number]         Date:  09/11/2017      Surgeon: Juliann Mule):  Elly Modena, MD    Procedure: LAPAROSCOPIC APPENDECTOMY (N/A Abdomen)    Medications prior to admission:   Prior to Admission medications    Medication Sig Start Date End Date Taking? Authorizing Provider   budesonide (ENTOCORT EC) 3 MG extended release capsule Take 9 mg by mouth every morning Three capsules daily 08/26/17   Ignacia Felling, MD   budesonide (ENTOCORT EC) 3 MG extended release capsule Take 3 capsules by mouth daily as needed 07/28/17   Historical Provider, MD   allopurinol (ZYLOPRIM) 300 MG tablet TAKE 1 TABLET BY MOUTH EVERY DAY 08/20/17   Ignacia Felling, MD   potassium chloride (KLOR-CON M) 10 MEQ extended release tablet TAKE 1 TABLET BY MOUTH THREE TIMES DAILY 08/11/17   Ignacia Felling, MD   Pancrelipase, Lip-Prot-Amyl, (CREON PO) Take by mouth 3 times daily (with meals)     Historical Provider, MD   amLODIPine (NORVASC) 5 MG tablet TAKE 1 TABLET BY MOUTH DAILY 05/05/17   Ignacia Felling, MD   simvastatin (ZOCOR) 40 MG tablet TAKE 1 TABLET BY MOUTH AT BEDTIME 05/05/17   Ignacia Felling, MD   Loperamide HCl (IMODIUM A-D PO) Take 2 tablets by mouth daily as needed     Historical Provider, MD   indomethacin (INDOCIN) 25 MG capsule Take 25 mg by mouth as needed for Pain    Historical Provider, MD   hydrALAZINE (APRESOLINE) 25 MG tablet TAKE 1 TABLET BY MOUTH EVERY 8 HOURS 01/29/16   Ignacia Felling, MD   albuterol-ipratropium (COMBIVENT RESPIMAT) 20-100 MCG/ACT AERS inhaler Inhale 1 puff into the lungs every 6 hours 12/25/15   Tobie Poet, MD   metoprolol succinate (TOPROL XL) 50 MG extended release tablet TAKE 1 TABLET BY MOUTH EVERY NIGHT 10/02/15   Ignacia Felling, MD   colchicine (COLCRYS) 0.6 MG tablet 1 po prn for gout attack may repeat in 2 hours no more than 2/24 hrs 03/15/15    Ignacia Felling, MD   Vitamin D (CHOLECALCIFEROL) 1000 UNITS CAPS capsule Take 2,000 Units by mouth daily.    Historical Provider, MD       Current medications:    No current facility-administered medications for this encounter.        Allergies:    Allergies   Allergen Reactions   . Shellfish-Derived Products Swelling     ORAL EDEMA, ORAL ITCHING       Problem List:    Patient Active Problem List   Diagnosis Code   . Gout M10.9   . Hyperlipidemia E78.5   . HTN (hypertension) I10   . Vitamin D deficiency E55.9   . Alcohol abuse F10.10   . Lymphocytic colitis K52.832   . Prostate CA ([place]) C61   . 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 Hyperglycemia R73.9   . 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 Alcoholism in recovery ([place]) 08/01/2017   .  Cancer [facility]) 2016    Prostate [name]   . Clostridium difficile infection 08/01/2010   . Gout    . HTN (hypertension)    . Hyperlipidemia    . Keloids 12/2003    multiple keloids on head   . Pancreatitis, alcoholic 09/81/19-14-7829   . Vitamin D deficiency        Past Surgical History:        Procedure Laterality Date   . COLONOSCOPY  02/04/13    Dr Fredonia Highland - normal repeat 2024   . COLONOSCOPY N/A 07/14/2017    COLONOSCOPY WITH BIOPSY performed by Waynard Reeds, MD at [place]   . OTHER SURGICAL HISTORY      KELOID SURGERY   . PROSTATE BIOPSY     . PROSTATECTOMY Bilateral 12/20/14    robotic assisted laparoscopic   . UPPER GASTROINTESTINAL ENDOSCOPY N/A 07/23/2017    EGD WITH ESOPHAGEAL ENDOSCOPIC ULTRASOUND performed by York Spaniel, MD at [place]   . UPPER GASTROINTESTINAL ENDOSCOPY N/A 07/23/2017    EGD BIOPSY performed by York Spaniel, MD at [place] History:    Social History   Substance Use Topics   . Smoking status: Never Smoker   . Smokeless tobacco: Never Used   . Alcohol use No      Comment: SOBER SINCE 08-01-17                                Counseling given: Not Answered      Vital Signs  (Current):   Vitals:    09/01/17 1453   Weight: 222 lb (100.7 kg)   Height: _0  (1.753 m)                                              BP Readings from Last 3 Encounters:   08/26/17 118/78   07/23/17 132/84   07/23/17 125/85       NPO Status:                                                                                 BMI:   Wt Readings from Last 3 Encounters:   09/01/17 222 lb (100.7 kg)   08/26/17 222 lb 9.6 oz (101 kg)   07/23/17 209 lb (94.8 kg)     Body mass index is 32.78 kg/m.    CBC:   Lab Results   Component Value Date    WBC 5.5 08/29/2017    RBC 3.43 08/29/2017    HGB 12.6 08/29/2017    HCT 37.3 08/29/2017    MCV 108.6 08/29/2017    RDW 15.0 08/29/2017    PLT 182 08/29/2017       CMP:   Lab Results   Component Value Date    NA 144 08/29/2017    K 4.4 08/29/2017    CL 103 08/29/2017    CO2 26 08/29/2017    BUN 4 08/29/2017  CREATININE 0.6 08/29/2017    GFRAA >60 08/29/2017    GFRAA >60 11/08/2011    AGRATIO 1.3 08/29/2017    LABGLOM >60 08/29/2017    LABGLOM 95 12/06/2009    GLUCOSE 92 08/29/2017    GLUCOSE 89 12/06/2009    PROT 7.4 08/29/2017    PROT 8.8 05/08/2011    CALCIUM 8.7 08/29/2017    BILITOT 0.7 08/29/2017    ALKPHOS 98 08/29/2017    AST 49 08/29/2017    ALT 26 08/29/2017       POC Tests: No results for input(s): POCGLU, POCNA, POCK, POCCL, POCBUN, POCHEMO, POCHCT in the last 72 hours.    Coags:   Lab Results   Component Value Date    PROTIME 14.2 07/27/2010    INR 1.1 07/27/2010       HCG (If Applicable): No results found for: PREGTESTUR, PREGSERUM, HCG, HCGQUANT     ABGs:   Lab Results   Component Value Date    PHART 7.44 07/27/2010    PO2ART 67 07/27/2010    PCO2ART 33 07/27/2010    HCO3ART 22 07/27/2010    BEART -1.0 07/27/2010        Type & Screen (If Applicable):  No results found for: LABABO, [place]    Anesthesia Evaluation  Patient summary reviewed and Nursing notes reviewed no history of anesthetic complications:   Airway: Mallampati: II  TM distance: >3 FB   Neck ROM:  full  Mouth opening: > = 3 FB Dental:          Pulmonary:normal exam    (+) asthma:                            Cardiovascular:  Exercise tolerance: good (>4 METS),   (+) hypertension:, hyperlipidemia                  Neuro/Psych:   (+) psychiatric history:            GI/Hepatic/Renal:             Endo/Other:                     Abdominal:           Vascular:                                        Anesthesia Plan      MAC     ASA 2       Induction: intravenous.    MIPS: Postoperative opioids intended, Prophylactic antiemetics administered and Postoperative trial extubation.  Anesthetic plan and risks discussed with patient.    Use of blood products discussed with patient whom consented to blood products.   Plan discussed with CRNA.                  Levonne Hubert, MD   09/11/2017

## 2017-09-11 NOTE — Op Note (Signed)
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    OPERATION PERFORMED:  Laparoscopic appendectomy.    SURGEON:  Phineas Semen, MD    ANESTHESIA:  General endotracheal.    ESTIMATED BLOOD LOSS:  Minimal.    COMPLICATIONS:  None.    SPECIMEN:  Appendix.    OPERATIVE INDICATIONS AND CONSENT:  The patient is Harper 57 year old male  who was seen in November of last year.  At that time, CAT scan showed  multiple findings including Harper dilated fluid-filled appendix measuring up  to 1.3 cm in diameter.  The patient did not have findings consistent  with acute appendicitis, was brought to the operating room today for  laparoscopic appendectomy based upon the concern for possible mucocele  of the appendix.    DETAILS OF THE PROCEDURE:  The patient was brought to the operative  suite and placed in the supine position on the operative table.  After  general endotracheal anesthesia, he was prepped and draped in the usual  sterile fashion.  We made Harper 5-mm transverse incision in the right upper  quadrant.  Harper Veress needle was passed into the peritoneal cavity after  adequate insufflation, Harper 5-mm OptiView trocar was placed at this site.   Harper 12-mm trocar was placed at the umbilicus followed by Harper 5-mm trocar in  the right lower quadrant.  The appendix was identified.  It did not  appear to be significantly dilated.  It did have some adhesions to the  mesentery of the terminal ileum.  These were taken down using the  LigaSure.  Once  all of the adhesions were lysed, the mesoappendix was  divided using the LigaSure.  The appendix was cleared circumferentially  at its base before it was divided with Harper transverse firing of an Endo  GIA blue stapler load.  The appendix was placed in an Endo Catch bag and  then removed through the periumbilical trocar site.  Trocars were then  reinserted.  There was bleeding on the staple line.  Cautery was applied  to the staple line and then the right lower quadrant and right pelvis  suctioned.  We had excellent hemostasis.  We also had excellent security  of the staple line on the cecum.  The trocar was removed under direct  visualization and the abdomen was de-insufflated.  The  fascia of the  pre-umbilical trocar site was closed with 0-Vicryl suture.  All areas  were injected with 0.5% Marcaine with epinephrine before the skin and  the incisions were closed with running 4-0 subcuticular sutures.   Dermabond was then applied.  The patient tolerated the procedure without  difficulty and was transferred to recovery room in stable condition        Phineas Semen, MD    D: 09/11/2017 8:47:19       T: 09/11/2017 9:01:14     [id number]  Job#: [id number]     Doc#: [id number]    CC:  Ignacia Felling, MD       Clide Cliff, MD

## 2017-09-11 NOTE — Progress Notes (Signed)
VSS,Phase 1 discharge criteria met--seen by anesthesia.

## 2017-09-11 NOTE — Progress Notes (Signed)
Arrived from OR to pacu. Awakening. Resps adequate on O2 per NC. VSS. Abdomen soft, 3 lap sites CDI with glue. C/o pain. Fentanyl 50 mcg given at bedside by CRNA.

## 2017-09-11 NOTE — Progress Notes (Signed)
Discussed d/c instructions with pt and friend/family at bedside. Verbalized understanding.  Provided pt/family with copy of instructions.

## 2017-09-11 NOTE — Anesthesia Post-Procedure Evaluation (Signed)
Department of Anesthesiology  Postprocedure Note    Patient: Phillip Harper  MRN: [id number]  Birthdate: 1961/06/23  Date of evaluation: 09/11/2017  Time:  9:23 AM     Procedure Summary     Date:  09/11/17 Room / Location:  [facility] OR 01 / [facility] OR    Anesthesia Start:  0728 Anesthesia Stop:  0840    Procedure:  LAPAROSCOPIC APPENDECTOMY (N/A Abdomen) Diagnosis:  (K30.9 APPENDIX DISEASE)    Surgeon:  Elly Modena, MD Responsible Provider:  Levonne Hubert, MD    Anesthesia Type:  MAC ASA Status:  2          Anesthesia Type: MAC    Aldrete Phase I: Aldrete Score: 8    Aldrete Phase II:      Last vitals: Reviewed and per EMR flowsheets.       Anesthesia Post Evaluation    Patient location during evaluation: PACU  Patient participation: complete - patient participated  Level of consciousness: awake  Airway patency: patent  Nausea & Vomiting: no nausea and no vomiting  Complications: no  Cardiovascular status: blood pressure returned to baseline  Respiratory status: acceptable  Hydration status: euvolemic

## 2017-09-11 NOTE — Discharge Instructions (Signed)
Postoperative Instructions (Laparoscopic Appendectomy 09/11/17)      Contact information    Office number - [phone]  Office hours are 8 am - 5 pm Monday - Friday   Contact the doctor on call during the evenings ( 5 pm - 8 am )   or on Weekends for urgent or emergent issues by using the main office number [phone] ).   Please hold routine questions until normal business hours.      Wound care     The incisions are closed with dissolvable sutures which are beneath the skin. Surgical glue is then applied to the skin. The glue is purple in color and should be left undisturbed until your follow-up appointment.   No bandages are required over the surgical glue. It is okay to shower but do not bath in a bathtub. Gently wash over the incisions with soap and water and then pat them dry with a towel.   Contact the office for redness of the skin surrounding the incision or if there is drainage of pus.      Activities    It is generally okay to go up and down stairs. Please be careful as your gate may be unsteady from the surgery or pain medications.    Driving: Do not drive while on narcotic pain medications or while still under the effect of narcotic pain medications.   Make sure that you are moving comfortably and not limited by postoperative pain or weakness that would make it difficult to react in an emergency situation.    Exercise : The main purpose of the activity restrictions is to reduce the risk of                                                  developing a hernia at an incision site.           Do not lift greater than 25 lbs          Avoid strenuous abdominal exercises- sit ups, core workouts          Light cardiovascular exercise is generally okay          Ask your doctor about the length of the exercise restrictions    Follow up     Please call the office for any concerns between the time of surgery and your follow up appointment.   We  prefer to have you call to schedule your follow up appointment as it will allow for some flexibility.    Please make the appointment for about 2 weeks from the date of surgery.      Return to work     Return to work is discussed with the doctor on an individual case basis.  If you need documentation for return to work or FMLA paperwork, please contact the office at [phone].              S[place] DISCHARGE INSTRUCTIONS     Wear your seatbelt home.   You are under the influence of drugs. Do not drink alcohol, drive, operate machinery, or make any important decisions or sign any legal documents for 24 hours   A responsible adult needs to be with you for 24 hours.   You may experience lightheadedness, dizziness, nausea, heightened emotions and/or sleepiness following surgery.   Rest at home  today- increase activity as tolerated.   Progress slowly to a regular diet and drink plenty of fluids unless your physician has instructed you otherwise.   If nausea becomes a problem, call your physician.   Coughing, sore throat, and muscle aches are other side effects of anesthesia and should improve with time.   Do not drive or operate machinery while taking narcotics.   ATTENTION MALE PATIENTS: if you use an oral contraceptive or birth control pill, you need to use an additional or alternative method for pregnancy prevention for the next thirty days.  Medications given today may render your contraceptive ineffective for this cycle.

## 2017-09-11 NOTE — Brief Op Note (Signed)
Brief Postoperative Note  ______________________________________________________________    Patient: Phillip Harper  Date of Birth: 1961-04-26  MRN: [id number]  Date of Procedure: 09/11/2017    Pre-Op Diagnosis: K30.9 APPENDIX DISEASE    Post-Op Diagnosis: Same       Procedure(s):  LAPAROSCOPIC APPENDECTOMY    Anesthesia: Monitor Anesthesia Care    Surgeon(s):  Elly Modena, MD    Assistant: Madilyn Fireman    Estimated Blood Loss (mL): less than 50     Complications: None    Specimens:   ID Type Source Tests Collected by Time Destination   A : A) APPENDIX Tissue Tissue SURGICAL PATHOLOGY Elly Modena, MD 09/11/2017 [phone]        Implants:  * No implants in log *      Drains:      Findings:     Elly Modena, MD  Date: 09/11/2017  Time: 8:23 AM

## 2017-09-12 LAB — EKG 12-LEAD
Atrial Rate: 81 {beats}/min
P Axis: 50 degrees
P-R Interval: 152 ms
Q-T Interval: 398 ms
QRS Duration: 106 ms
QTc Calculation (Bazett): 462 ms
R Axis: 28 degrees
T Axis: 39 degrees
Ventricular Rate: 81 {beats}/min

## 2017-09-25 ENCOUNTER — Ambulatory Visit
Admit: 2017-09-25 | Discharge: 2017-09-25 | Payer: BLUE CROSS/BLUE SHIELD | Attending: Surgery | Primary: Internal Medicine

## 2017-09-25 DIAGNOSIS — K389 Disease of appendix, unspecified: Secondary | ICD-10-CM

## 2017-09-25 NOTE — Communication Body (Signed)
Assessment:     57 year old male status post laparoscopic appendectomy.  Pathology showed luminal acute inflammatory changes.  There was no evidence of a mucocele.  He is doing well postoperatively.      Plan:     Follow-up as needed.

## 2017-09-25 NOTE — Progress Notes (Signed)
Subjective:      Patient ID: Phillip Harper is a 57 y.o. male.    HPI    Review of Systems    Objective:   Physical Exam  Abdomen soft  Incisions healing well  Assessment:      57 year old male status post laparoscopic appendectomy.  Pathology showed luminal acute inflammatory changes.  There was no evidence of a mucocele.  He is doing well postoperatively.      Plan:      Follow-up as needed.        Elly Modena, MD

## 2017-10-28 MED ORDER — POTASSIUM CHLORIDE CRYS ER 10 MEQ PO TBCR
10 MEQ | ORAL_TABLET | ORAL | 1 refills | Status: DC
Start: 2017-10-28 — End: 2017-12-08

## 2017-11-18 MED ORDER — HYDRALAZINE HCL 25 MG PO TABS
25 MG | ORAL_TABLET | ORAL | 1 refills | Status: DC
Start: 2017-11-18 — End: 2018-03-27

## 2017-11-18 MED ORDER — ALLOPURINOL 300 MG PO TABS
300 MG | ORAL_TABLET | ORAL | 1 refills | Status: DC
Start: 2017-11-18 — End: 2018-05-20

## 2017-12-08 MED ORDER — POTASSIUM CHLORIDE CRYS ER 10 MEQ PO TBCR
10 MEQ | ORAL_TABLET | ORAL | 1 refills | Status: DC
Start: 2017-12-08 — End: 2018-08-24

## 2017-12-24 ENCOUNTER — Ambulatory Visit
Admit: 2017-12-24 | Discharge: 2017-12-24 | Payer: BLUE CROSS/BLUE SHIELD | Attending: Internal Medicine | Primary: Internal Medicine

## 2017-12-24 DIAGNOSIS — I10 Essential (primary) hypertension: Secondary | ICD-10-CM

## 2017-12-24 NOTE — Progress Notes (Signed)
Subjective:      Patient ID: Phillip Harper is a 57 y.o. male.    HPI  Here today for follow up of chronic problems as per HPI and as problems listed under assessment and plan,no new c/o feels good     Hyperlipidemia   This is a chronic problem. The current episode started more than 1 year ago. The problem is controlled. Recent lipid tests were reviewed and are normal. Exacerbating diseases include obesity. There are no known factors aggravating his hyperlipidemia. Pertinent negatives include no chest pain or shortness of breath. Current antihyperlipidemic treatment includes diet change, exercise and statins. The current treatment provides significant improvement of lipids. There are no compliance problems.  Risk factors for coronary artery disease include dyslipidemia, hypertension and obesity.   Hypertension   This is a chronic problem. The current episode started more than 1 year ago. The problem is unchanged. The problem is controlled. Pertinent negatives include no chest pain, headaches, palpitations or shortness of breath. There are no associated agents to hypertension. Risk factors for coronary artery disease include dyslipidemia, male gender and obesity. Past treatments include beta blockers, central alpha agonists and lifestyle changes. The current treatment provides significant improvement. There are no compliance problems.        Allergies   Allergen Reactions   ??? Shellfish-Derived Products Swelling     ORAL EDEMA, ORAL ITCHING       Current Outpatient Medications   Medication Sig Dispense Refill   ??? potassium chloride (KLOR-CON M) 10 MEQ extended release tablet TAKE 1 TABLET BY MOUTH THREE TIMES DAILY 270 tablet 1   ??? hydrALAZINE (APRESOLINE) 25 MG tablet TAKE 1 TABLET BY MOUTH EVERY 8 HOURS 270 tablet 1   ??? allopurinol (ZYLOPRIM) 300 MG tablet TAKE 1 TABLET BY MOUTH EVERY DAY 90 tablet 1   ??? budesonide (ENTOCORT EC) 3 MG extended release capsule Take 9 mg by mouth every morning Three capsules daily      ??? Pancrelipase, Lip-Prot-Amyl, (CREON PO) Take by mouth 3 times daily (with meals)      ??? amLODIPine (NORVASC) 5 MG tablet TAKE 1 TABLET BY MOUTH DAILY 90 tablet 2   ??? simvastatin (ZOCOR) 40 MG tablet TAKE 1 TABLET BY MOUTH AT BEDTIME 90 tablet 2   ??? Loperamide HCl (IMODIUM A-D PO) Take 2 tablets by mouth daily as needed      ??? indomethacin (INDOCIN) 25 MG capsule Take 25 mg by mouth as needed for Pain     ??? hydrALAZINE (APRESOLINE) 25 MG tablet TAKE 1 TABLET BY MOUTH EVERY 8 HOURS 270 tablet 0   ??? albuterol-ipratropium (COMBIVENT RESPIMAT) 20-100 MCG/ACT AERS inhaler Inhale 1 puff into the lungs every 6 hours 1 Inhaler 0   ??? metoprolol succinate (TOPROL XL) 50 MG extended release tablet TAKE 1 TABLET BY MOUTH EVERY NIGHT 90 tablet 0   ??? colchicine (COLCRYS) 0.6 MG tablet 1 po prn for gout attack may repeat in 2 hours no more than 2/24 hrs 10 tablet 1   ??? Vitamin D (CHOLECALCIFEROL) 1000 UNITS CAPS capsule Take 2,000 Units by mouth daily.       No current facility-administered medications for this visit.        Past Medical History:   Diagnosis Date   ??? Alcoholism in recovery [facility]) 08/01/2017   ??? Cancer [facility]) 2016    Prostate [name]   ??? Clostridium difficile infection 08/01/2010   ??? Gout    ??? HTN (hypertension)    ???  Hyperlipidemia    ??? Keloids 12/2003    multiple keloids on head   ??? Pancreatitis, alcoholic [id number]   ??? Vitamin D deficiency        Family History   Problem Relation Age of Onset   ??? Cancer Mother         lung CA   ??? Heart Disease Father         CAD   ??? Cancer Sister         lung CA       Past Surgical History:   Procedure Laterality Date   ??? COLONOSCOPY  02/04/13    Dr Fredonia Highland - normal repeat 2024   ??? COLONOSCOPY N/A 07/14/2017    COLONOSCOPY WITH BIOPSY performed by Waynard Reeds, MD at [place]   ??? LAPAROSCOPIC APPENDECTOMY N/A 09/11/2017    LAPAROSCOPIC APPENDECTOMY performed by Elly Modena, MD at [place]   ??? OTHER SURGICAL HISTORY      KELOID SURGERY   ??? PROSTATE BIOPSY      ??? PROSTATECTOMY Bilateral 12/20/14    robotic assisted laparoscopic   ??? UPPER GASTROINTESTINAL ENDOSCOPY N/A 07/23/2017    EGD WITH ESOPHAGEAL ENDOSCOPIC ULTRASOUND performed by York Spaniel, MD at [place]   ??? UPPER GASTROINTESTINAL ENDOSCOPY N/A 07/23/2017    EGD BIOPSY performed by York Spaniel, MD at [place] History     Socioeconomic History   ??? Marital status: Legally Separated     Spouse name: Not on file   ??? Number of children: Not on file   ??? Years of education: Not on file   ??? Highest education level: Not on file   Occupational History   ??? Not on file   Social Needs   ??? Financial resource strain: Not on file   ??? Food insecurity:     Worry: Not on file     Inability: Not on file   ??? Transportation needs:     Medical: Not on file     Non-medical: Not on file   Tobacco Use   ??? Smoking status: Never Smoker   ??? Smokeless tobacco: Never Used   Substance and Sexual Activity   ??? Alcohol use: No     Alcohol/week: 0.0 oz     Comment: SOBER SINCE 08-01-17   ??? Drug use: No   ??? Sexual activity: Yes     Partners: Female   Lifestyle   ??? Physical activity:     Days per week: Not on file     Minutes per session: Not on file   ??? Stress: Not on file   Relationships   ??? Social connections:     Talks on phone: Not on file     Gets together: Not on file     Attends religious service: Not on file     Active member of club or organization: Not on file     Attends meetings of clubs or organizations: Not on file     Relationship status: Not on file   ??? Intimate partner violence:     Fear of current or ex partner: Not on file     Emotionally abused: Not on file     Physically abused: Not on file     Forced sexual activity: Not on file   Other Topics Concern   ??? Not on file   Social History Narrative   ??? Not on file  Review of Systems  Review of Systems   Constitutional: Positive for unexpected weight change (up a few# ).   HENT: Positive for congestion and rhinorrhea.    Eyes: Negative.          Glasses   Respiratory: Negative.  Negative for choking, shortness of breath and wheezing.    Cardiovascular: Negative.  Negative for chest pain and palpitations.   Gastrointestinal: Positive for diarrhea (lymph. colitis improved ). Negative for blood in stool and vomiting.   Endocrine: Negative.  Negative for polydipsia, polyphagia and polyuria.   Genitourinary: Positive for frequency and urgency.        Nocturia -s/p prostate Ca check labs    Musculoskeletal: Negative.         Gout is stable since taking allopurinol    Skin: Negative.         Acne keloidas nuchae back of neck as noted    Allergic/Immunologic: Negative.    Neurological: Negative.  Negative for headaches.   Hematological: Negative.    Psychiatric/Behavioral: Negative.        Objective:   Physical Exam:  Physical Exam   Constitutional: He is oriented to person, place, and time. He appears well-developed and well-nourished.   HENT:   Head: Normocephalic and atraumatic.   Nose: Nose normal.   Mouth/Throat: Oropharynx is clear and moist.   Eyes: Conjunctivae and EOM are normal.   Neck: Normal range of motion. Neck supple. No tracheal deviation present. No thyromegaly present.   Cardiovascular: Normal rate, regular rhythm, normal heart sounds and intact distal pulses.   No murmur heard.  Pulmonary/Chest: Effort normal and breath sounds normal.   Abdominal: Soft. Bowel sounds are normal.   obese   Genitourinary:   Genitourinary Comments: Per [name]   Musculoskeletal: Normal range of motion. Tenderness: low back improved    Neurological: He is alert and oriented to person, place, and time. He has normal reflexes.   Skin: Skin is warm and dry.   Acne keloidas nuchae back of neck as noted    Psychiatric: He has a normal mood and affect. His behavior is normal.       BP 124/70 (Site: Right Upper Arm, Position: Sitting, Cuff Size: Medium Adult)    Pulse 66    Resp 16    Ht 5\' 9"  (1.753 m)    Wt 225 lb 12.8 oz (102.4 kg)    BMI 33.34 kg/m??        Assessment & Plan:       Gout  No recent symptoms cont diet and meds     Hyperlipidemia  Stable continue current meds and return in 3 mo.    cpe     HTN (hypertension)  Stable continue current meds and return in 3 mo.    cpe     Vitamin D deficiency  Continue current meds     Alcohol abuse  No ETOH x 2 mo.     Lymphocytic colitis  occ diarrhea prn Imodium wears a depends and cont other meds     Prostate CA ([place])  Remains stable for now     Hyperglycemia  Continue current meds

## 2017-12-24 NOTE — Assessment & Plan Note (Signed)
Continue current meds

## 2017-12-24 NOTE — Assessment & Plan Note (Signed)
No ETOH x 2 mo.

## 2017-12-24 NOTE — Assessment & Plan Note (Signed)
No recent symptoms cont diet and meds

## 2017-12-24 NOTE — Assessment & Plan Note (Signed)
Stable continue current meds and return in 3 mo.    cpe

## 2017-12-24 NOTE — Assessment & Plan Note (Signed)
Remains stable for now

## 2017-12-24 NOTE — Assessment & Plan Note (Addendum)
occ diarrhea prn Imodium wears a depends and cont other meds

## 2018-01-22 MED ORDER — METOPROLOL SUCCINATE ER 50 MG PO TB24
50 MG | ORAL_TABLET | ORAL | 1 refills | Status: DC
Start: 2018-01-22 — End: 2018-06-10

## 2018-03-21 ENCOUNTER — Encounter

## 2018-03-21 LAB — COMPREHENSIVE METABOLIC PANEL
ALT: 14 U/L (ref 10–40)
AST: 51 U/L — ABNORMAL HIGH (ref 15–37)
Albumin/Globulin Ratio: 1.2 (ref 1.1–2.2)
Albumin: 4.1 g/dL (ref 3.4–5.0)
Alkaline Phosphatase: 71 U/L (ref 40–129)
Anion Gap: 14 (ref 3–16)
BUN: 4 mg/dL — ABNORMAL LOW (ref 7–20)
CO2: 25 mmol/L (ref 21–32)
Calcium: 9.1 mg/dL (ref 8.3–10.6)
Chloride: 105 mmol/L (ref 99–110)
Creatinine: 0.5 mg/dL — ABNORMAL LOW (ref 0.9–1.3)
GFR African American: >60 (ref >60)
GFR Non-African American: >60 (ref >60)
Globulin: 3.4 g/dL
Glucose: 90 mg/dL (ref 70–99)
Potassium: 4.6 mmol/L (ref 3.5–5.1)
Sodium: 144 mmol/L (ref 136–145)
Total Bilirubin: 0.4 mg/dL (ref 0.0–1.0)
Total Protein: 7.5 g/dL (ref 6.4–8.2)

## 2018-03-21 LAB — URINALYSIS WITH REFLEX TO CULTURE
Bilirubin Urine: NEGATIVE
Blood, Urine: NEGATIVE
Glucose, Ur: NEGATIVE mg/dL
Ketones, Urine: NEGATIVE mg/dL
Leukocyte Esterase, Urine: NEGATIVE
Nitrite, Urine: NEGATIVE
Protein, UA: NEGATIVE mg/dL
Specific Gravity, UA: 1.018 (ref 1.005–1.030)
Urobilinogen, Urine: 0.2 E.U./dL (ref <2.0)
pH, UA: 5.5 (ref 5.0–8.0)

## 2018-03-21 LAB — CBC WITH AUTO DIFFERENTIAL
Basophils %: 0.8 %
Basophils Absolute: 0.1 10*3/uL (ref 0.0–0.2)
Eosinophils %: 1.4 %
Eosinophils Absolute: 0.1 10*3/uL (ref 0.0–0.6)
Hematocrit: 40 % — ABNORMAL LOW (ref 40.5–52.5)
Hemoglobin: 13.6 g/dL (ref 13.5–17.5)
Lymphocytes %: 25.3 %
Lymphocytes Absolute: 1.8 10*3/uL (ref 1.0–5.1)
MCH: 38.8 pg — ABNORMAL HIGH (ref 26.0–34.0)
MCHC: 33.9 g/dL (ref 31.0–36.0)
MCV: 114.5 fL — ABNORMAL HIGH (ref 80.0–100.0)
MPV: 7.6 fL (ref 5.0–10.5)
Monocytes %: 8.6 %
Monocytes Absolute: 0.6 10*3/uL (ref 0.0–1.3)
Neutrophils %: 63.9 %
Neutrophils Absolute: 4.4 10*3/uL (ref 1.7–7.7)
Platelets: 193 10*3/uL (ref 135–450)
RBC: 3.5 M/uL — ABNORMAL LOW (ref 4.20–5.90)
RDW: 16.1 % — ABNORMAL HIGH (ref 12.4–15.4)
WBC: 6.9 10*3/uL (ref 4.0–11.0)

## 2018-03-21 LAB — PSA PROSTATIC SPECIFIC ANTIGEN: PSA: <0.01 ng/mL (ref 0.00–4.00)

## 2018-03-21 LAB — LIPID PANEL
Cholesterol, Total: 159 mg/dL (ref 0–199)
HDL: 90 mg/dL — ABNORMAL HIGH (ref 40–60)
LDL Calculated: 48 mg/dL (ref <100)
Triglycerides: 105 mg/dL (ref 0–150)
VLDL Cholesterol Calculated: 21 mg/dL

## 2018-03-21 LAB — TSH WITH REFLEX: TSH: 1.61 u[IU]/mL (ref 0.27–4.20)

## 2018-03-21 LAB — URIC ACID: Uric Acid, Serum: 3 mg/dL — ABNORMAL LOW (ref 3.5–7.2)

## 2018-03-22 LAB — HEMOGLOBIN A1C
Hemoglobin A1C: 5.3 %
eAG: 105.4 mg/dL

## 2018-03-23 LAB — PERIPHERAL BLOOD SMEAR, PATH REVIEW

## 2018-03-27 ENCOUNTER — Ambulatory Visit
Admit: 2018-03-27 | Discharge: 2018-03-27 | Payer: BLUE CROSS/BLUE SHIELD | Attending: Internal Medicine | Primary: Internal Medicine

## 2018-03-27 DIAGNOSIS — Z23 Encounter for immunization: Secondary | ICD-10-CM

## 2018-03-27 NOTE — Assessment & Plan Note (Signed)
Continue current meds

## 2018-03-27 NOTE — Assessment & Plan Note (Signed)
Stable continue current meds and return in 3 mo.

## 2018-03-27 NOTE — Assessment & Plan Note (Signed)
occ mild symptoms  Uric acid is low at 3.0

## 2018-03-27 NOTE — Assessment & Plan Note (Signed)
Diet controled no symptoms

## 2018-03-27 NOTE — Assessment & Plan Note (Signed)
Cont to stay off ETOH

## 2018-03-27 NOTE — Assessment & Plan Note (Signed)
Within normal limits for age- cont to work no ADL issues,immunizations up to date, no depression ,no cognitive impairment  Colonoscopy up to date  Eye exam up to date  Exercises as tolerated    No living will but does not want resuscitation   Findings and recommendations discussed with Pt

## 2018-03-27 NOTE — Assessment & Plan Note (Signed)
No symptoms PSA <0.01

## 2018-03-27 NOTE — Assessment & Plan Note (Signed)
Stable for now on and off c/o to see GI soon

## 2018-03-27 NOTE — Progress Notes (Signed)
Subjective:      Patient ID: Phillip Harper is a 57 y.o. male.    HPI  Here today for complete physical and review of chronic problems as listed under assessment and plan,no new c/o feels good     Hyperlipidemia   This is a chronic problem. The current episode started more than 1 year ago. The problem is controlled. Recent lipid tests were reviewed and are low. There are no known factors aggravating his hyperlipidemia. Current antihyperlipidemic treatment includes diet change, exercise and statins. The current treatment provides significant improvement of lipids. There are no compliance problems.  Risk factors for coronary artery disease include dyslipidemia, hypertension, male sex and obesity.   Hypertension   This is a chronic problem. The current episode started more than 1 year ago. The problem is unchanged. The problem is controlled. Associated symptoms include anxiety. Risk factors for coronary artery disease include dyslipidemia, male gender and obesity. Past treatments include beta blockers, lifestyle changes, calcium channel blockers and diuretics. The current treatment provides significant improvement. There are no compliance problems.        Allergies   Allergen Reactions   ??? Shellfish-Derived Products Swelling     ORAL EDEMA, ORAL ITCHING       Current Outpatient Medications   Medication Sig Dispense Refill   ??? metoprolol succinate (TOPROL XL) 50 MG extended release tablet TAKE 1 TABLET BY MOUTH EVERY NIGHT 90 tablet 1   ??? potassium chloride (KLOR-CON M) 10 MEQ extended release tablet TAKE 1 TABLET BY MOUTH THREE TIMES DAILY 270 tablet 1   ??? allopurinol (ZYLOPRIM) 300 MG tablet TAKE 1 TABLET BY MOUTH EVERY DAY 90 tablet 1   ??? budesonide (ENTOCORT EC) 3 MG extended release capsule Take 9 mg by mouth every morning Three capsules daily     ??? Pancrelipase, Lip-Prot-Amyl, (CREON PO) Take by mouth 3 times daily (with meals)      ??? amLODIPine (NORVASC) 5 MG tablet TAKE 1 TABLET BY MOUTH DAILY 90 tablet 2   ???  simvastatin (ZOCOR) 40 MG tablet TAKE 1 TABLET BY MOUTH AT BEDTIME 90 tablet 2   ??? Loperamide HCl (IMODIUM A-D PO) Take 2 tablets by mouth daily as needed      ??? indomethacin (INDOCIN) 25 MG capsule Take 25 mg by mouth as needed for Pain     ??? hydrALAZINE (APRESOLINE) 25 MG tablet TAKE 1 TABLET BY MOUTH EVERY 8 HOURS 270 tablet 0   ??? albuterol-ipratropium (COMBIVENT RESPIMAT) 20-100 MCG/ACT AERS inhaler Inhale 1 puff into the lungs every 6 hours 1 Inhaler 0   ??? colchicine (COLCRYS) 0.6 MG tablet 1 po prn for gout attack may repeat in 2 hours no more than 2/24 hrs 10 tablet 1   ??? Vitamin D (CHOLECALCIFEROL) 1000 UNITS CAPS capsule Take 2,000 Units by mouth daily.       No current facility-administered medications for this visit.        Past Medical History:   Diagnosis Date   ??? Alcoholism in recovery [facility]) 08/01/2017   ??? Cancer [facility]) 2016    Prostate [name]   ??? Clostridium difficile infection 08/01/2010   ??? Gout    ??? HTN (hypertension)    ??? Hyperlipidemia    ??? Keloids 12/2003    multiple keloids on head   ??? Pancreatitis, alcoholic 84/[id number]   ??? Vitamin D deficiency        Family History   Problem Relation Age of Onset   ???  Cancer Mother         lung CA   ??? Heart Disease Father         CAD   ??? Cancer Sister         lung CA       Past Surgical History:   Procedure Laterality Date   ??? COLONOSCOPY  02/04/13    Dr Fredonia Highland - normal repeat 2024   ??? COLONOSCOPY N/A 07/14/2017    COLONOSCOPY WITH BIOPSY performed by Waynard Reeds, MD at [place]   ??? LAPAROSCOPIC APPENDECTOMY N/A 09/11/2017    LAPAROSCOPIC APPENDECTOMY performed by Elly Modena, MD at [place]   ??? OTHER SURGICAL HISTORY      KELOID SURGERY   ??? PROSTATE BIOPSY     ??? PROSTATECTOMY Bilateral 12/20/14    robotic assisted laparoscopic   ??? UPPER GASTROINTESTINAL ENDOSCOPY N/A 07/23/2017    EGD WITH ESOPHAGEAL ENDOSCOPIC ULTRASOUND performed by York Spaniel, MD at [place]   ??? UPPER GASTROINTESTINAL ENDOSCOPY N/A 07/23/2017    EGD  BIOPSY performed by York Spaniel, MD at [place] History     Socioeconomic History   ??? Marital status: Legally Separated     Spouse name: Not on file   ??? Number of children: Not on file   ??? Years of education: Not on file   ??? Highest education level: Not on file   Occupational History   ??? Not on file   Social Needs   ??? Financial resource strain: Not on file   ??? Food insecurity:     Worry: Not on file     Inability: Not on file   ??? Transportation needs:     Medical: Not on file     Non-medical: Not on file   Tobacco Use   ??? Smoking status: Never Smoker   ??? Smokeless tobacco: Never Used   Substance and Sexual Activity   ??? Alcohol use: No     Alcohol/week: 0.0 standard drinks     Comment: SOBER SINCE 08-01-17   ??? Drug use: No   ??? Sexual activity: Yes     Partners: Female   Lifestyle   ??? Physical activity:     Days per week: Not on file     Minutes per session: Not on file   ??? Stress: Not on file   Relationships   ??? Social connections:     Talks on phone: Not on file     Gets together: Not on file     Attends religious service: Not on file     Active member of club or organization: Not on file     Attends meetings of clubs or organizations: Not on file     Relationship status: Not on file   ??? Intimate partner violence:     Fear of current or ex partner: Not on file     Emotionally abused: Not on file     Physically abused: Not on file     Forced sexual activity: Not on file   Other Topics Concern   ??? Not on file   Social History Narrative   ??? Not on file       Review of Systems  Review of Systems   Constitutional: Negative.  Unexpected weight change: up a few.   HENT: Negative.    Eyes: Negative.         Glasses  Respiratory: Negative.         Sleep study was within normal limits no  c-pap needed    Cardiovascular: Negative.    Gastrointestinal: Negative.    Endocrine: Negative.    Genitourinary: Positive for frequency and urgency.        Nocturia    Musculoskeletal: Negative.         Gout is  stable since taking allopurinol    Skin: Negative.         Acne keloidas nuchae back of neck as noted    Allergic/Immunologic: Negative.    Neurological: Negative.    Hematological: Negative.    Psychiatric/Behavioral: Negative.        Objective:   Physical Exam:  Physical Exam   Constitutional: He is oriented to person, place, and time. He appears well-developed and well-nourished.   HENT:   Head: Normocephalic and atraumatic.   Right Ear: External ear normal.   Left Ear: External ear normal.   Nose: Nose normal.   Mouth/Throat: Oropharynx is clear and moist.   Eyes: Pupils are equal, round, and reactive to light. Conjunctivae and EOM are normal.   Neck: Normal range of motion. Neck supple. No tracheal deviation present. No thyromegaly present.   Cardiovascular: Normal rate, regular rhythm, normal heart sounds and intact distal pulses.   No murmur heard.  Pulmonary/Chest: Effort normal and breath sounds normal.   Abdominal: Soft. Bowel sounds are normal.   obese   Genitourinary:   Genitourinary Comments: Per [name]   Musculoskeletal: Normal range of motion. He exhibits tenderness (low back ).   Neurological: He is alert and oriented to person, place, and time. He has normal reflexes.   Skin: Skin is warm and dry.   Acne keloidas nuchae back of neck as noted    Psychiatric: He has a normal mood and affect. His behavior is normal.       BP 120/70 (Site: Right Upper Arm, Position: Sitting, Cuff Size: Medium Adult)    Pulse 66    Resp 16    Ht 5\' 9"  (1.753 m)    Wt 228 lb 6.4 oz (103.6 kg)    BMI 33.73 kg/m??       Assessment & Plan:         Alcohol abuse  Cont to stay off ETOH     Gout  occ mild symptoms  Uric acid is low at 3.0     HTN (hypertension)  Stable continue current meds and return in 3 mo.        Hyperglycemia  Diet controled no symptoms     Hyperlipidemia  Stable continue current meds and return in 3 mo.        Lymphocytic colitis  Stable for now on and off c/o to see GI soon     Prostate CA ([place])  No  symptoms PSA <0.01    Vitamin D deficiency  Continue current meds     Well adult exam  Within normal limits for age- cont to work no ADL issues,immunizations up to date, no depression ,no cognitive impairment  Colonoscopy up to date  Eye exam up to date  Exercises as tolerated    No living will but does not want resuscitation   Findings and recommendations discussed with Pt

## 2018-05-20 MED ORDER — ALLOPURINOL 300 MG PO TABS
300 MG | ORAL_TABLET | ORAL | 1 refills | Status: DC
Start: 2018-05-20 — End: 2018-11-16

## 2018-05-20 MED ORDER — HYDRALAZINE HCL 25 MG PO TABS
25 MG | ORAL_TABLET | ORAL | 1 refills | Status: DC
Start: 2018-05-20 — End: 2018-11-16

## 2018-05-29 MED ORDER — SIMVASTATIN 40 MG PO TABS
40 MG | ORAL_TABLET | ORAL | 1 refills | Status: DC
Start: 2018-05-29 — End: 2018-11-24

## 2018-06-10 MED ORDER — METOPROLOL SUCCINATE ER 50 MG PO TB24
50 MG | ORAL_TABLET | ORAL | 1 refills | Status: DC
Start: 2018-06-10 — End: 2019-01-12

## 2018-06-29 ENCOUNTER — Ambulatory Visit
Admit: 2018-06-29 | Discharge: 2018-06-29 | Payer: BLUE CROSS/BLUE SHIELD | Attending: Internal Medicine | Primary: Internal Medicine

## 2018-06-29 DIAGNOSIS — I10 Essential (primary) hypertension: Secondary | ICD-10-CM

## 2018-06-29 NOTE — Assessment & Plan Note (Signed)
Cont Sober as noted

## 2018-06-29 NOTE — Assessment & Plan Note (Signed)
No recent symptoms cont meds and diet

## 2018-06-29 NOTE — Assessment & Plan Note (Signed)
Stable continue current meds and return in 3 mo.    Repeat labs in 3 mo.

## 2018-06-29 NOTE — Progress Notes (Signed)
Subjective:      Patient ID: Phillip Harper is a 57 y.o. male.    HPI  Here today for follow up of chronic problems as per HPI and as problems listed under assessment and plan,no new c/o feels good     Hyperlipidemia   This is a chronic problem. The current episode started more than 1 year ago. The problem is controlled. Recent lipid tests were reviewed and are low. There are no known factors aggravating his hyperlipidemia. Current antihyperlipidemic treatment includes diet change, exercise and statins. The current treatment provides significant improvement of lipids. There are no compliance problems.  Risk factors for coronary artery disease include dyslipidemia, hypertension, male sex and obesity.   Hypertension   This is a chronic problem. The current episode started more than 1 year ago. The problem is unchanged. The problem is controlled. Associated symptoms include anxiety. Risk factors for coronary artery disease include dyslipidemia, male gender and obesity. Past treatments include beta blockers, lifestyle changes, calcium channel blockers and diuretics. The current treatment provides significant improvement. There are no compliance problems.        Allergies   Allergen Reactions   ??? Shellfish-Derived Products Swelling     ORAL EDEMA, ORAL ITCHING       Current Outpatient Medications   Medication Sig Dispense Refill   ??? metoprolol succinate (TOPROL XL) 50 MG extended release tablet TAKE 1 TABLET BY MOUTH EVERY NIGHT 90 tablet 1   ??? simvastatin (ZOCOR) 40 MG tablet TAKE 1 TABLET BY MOUTH AT BEDTIME 90 tablet 1   ??? allopurinol (ZYLOPRIM) 300 MG tablet TAKE 1 TABLET BY MOUTH EVERY DAY 90 tablet 1   ??? hydrALAZINE (APRESOLINE) 25 MG tablet TAKE 1 TABLET BY MOUTH EVERY 8 HOURS 270 tablet 1   ??? potassium chloride (KLOR-CON M) 10 MEQ extended release tablet TAKE 1 TABLET BY MOUTH THREE TIMES DAILY 270 tablet 1   ??? budesonide (ENTOCORT EC) 3 MG extended release capsule Take 9 mg by mouth every morning Three capsules  daily     ??? Pancrelipase, Lip-Prot-Amyl, (CREON PO) Take by mouth 3 times daily (with meals)      ??? amLODIPine (NORVASC) 5 MG tablet TAKE 1 TABLET BY MOUTH DAILY 90 tablet 2   ??? Loperamide HCl (IMODIUM A-D PO) Take 2 tablets by mouth daily as needed      ??? indomethacin (INDOCIN) 25 MG capsule Take 25 mg by mouth as needed for Pain     ??? albuterol-ipratropium (COMBIVENT RESPIMAT) 20-100 MCG/ACT AERS inhaler Inhale 1 puff into the lungs every 6 hours 1 Inhaler 0   ??? colchicine (COLCRYS) 0.6 MG tablet 1 po prn for gout attack may repeat in 2 hours no more than 2/24 hrs 10 tablet 1   ??? Vitamin D (CHOLECALCIFEROL) 1000 UNITS CAPS capsule Take 2,000 Units by mouth daily.       No current facility-administered medications for this visit.        Past Medical History:   Diagnosis Date   ??? Alcoholism in recovery [facility]) 08/01/2017   ??? Cancer [facility]) 2016    Prostate [name]   ??? Clostridium difficile infection 08/01/2010   ??? Gout    ??? HTN (hypertension)    ??? Hyperlipidemia    ??? Keloids 12/2003    multiple keloids on head   ??? Pancreatitis, alcoholic [id number]   ??? Vitamin D deficiency        Family History   Problem Relation Age of Onset   ???  Cancer Mother         lung CA   ??? Heart Disease Father         CAD   ??? Cancer Sister         lung CA       Past Surgical History:   Procedure Laterality Date   ??? COLONOSCOPY  02/04/13    Dr Fredonia Highland - normal repeat 2024   ??? COLONOSCOPY N/A 07/14/2017    COLONOSCOPY WITH BIOPSY performed by Waynard Reeds, MD at [place]   ??? LAPAROSCOPIC APPENDECTOMY N/A 09/11/2017    LAPAROSCOPIC APPENDECTOMY performed by Elly Modena, MD at [place]   ??? OTHER SURGICAL HISTORY      KELOID SURGERY   ??? PROSTATE BIOPSY     ??? PROSTATECTOMY Bilateral 12/20/14    robotic assisted laparoscopic   ??? UPPER GASTROINTESTINAL ENDOSCOPY N/A 07/23/2017    EGD WITH ESOPHAGEAL ENDOSCOPIC ULTRASOUND performed by York Spaniel, MD at [place]   ??? UPPER GASTROINTESTINAL ENDOSCOPY N/A 07/23/2017    EGD  BIOPSY performed by York Spaniel, MD at [place] History     Socioeconomic History   ??? Marital status: Legally Separated     Spouse name: Not on file   ??? Number of children: Not on file   ??? Years of education: Not on file   ??? Highest education level: Not on file   Occupational History   ??? Not on file   Social Needs   ??? Financial resource strain: Not on file   ??? Food insecurity:     Worry: Not on file     Inability: Not on file   ??? Transportation needs:     Medical: Not on file     Non-medical: Not on file   Tobacco Use   ??? Smoking status: Never Smoker   ??? Smokeless tobacco: Never Used   Substance and Sexual Activity   ??? Alcohol use: No     Alcohol/week: 0.0 standard drinks     Comment: SOBER SINCE 08-01-17   ??? Drug use: No   ??? Sexual activity: Yes     Partners: Female   Lifestyle   ??? Physical activity:     Days per week: Not on file     Minutes per session: Not on file   ??? Stress: Not on file   Relationships   ??? Social connections:     Talks on phone: Not on file     Gets together: Not on file     Attends religious service: Not on file     Active member of club or organization: Not on file     Attends meetings of clubs or organizations: Not on file     Relationship status: Not on file   ??? Intimate partner violence:     Fear of current or ex partner: Not on file     Emotionally abused: Not on file     Physically abused: Not on file     Forced sexual activity: Not on file   Other Topics Concern   ??? Not on file   Social History Narrative   ??? Not on file       Review of Systems  Review of Systems   Constitutional: Negative.  Unexpected weight change: up a few.   HENT: Negative.    Eyes: Negative.         Glasses  Respiratory: Negative.         Sleep study was within normal limits no  c-pap needed    Cardiovascular: Negative.    Gastrointestinal: Negative.    Endocrine: Negative.    Genitourinary: Positive for frequency and urgency.        Nocturia cont with [name]    Musculoskeletal:  Negative.         Gout is stable since taking allopurinol    Skin: Negative.         Acne keloidas nuchae back of neck as noted    Allergic/Immunologic: Negative.    Neurological: Negative.    Hematological: Negative.    Psychiatric/Behavioral: Negative.        Objective:   Physical Exam:  Physical Exam  Constitutional:       Appearance: He is well-developed. He is obese.   HENT:      Head: Normocephalic and atraumatic.      Right Ear: External ear normal.      Left Ear: External ear normal.      Nose: Nose normal.      Mouth/Throat:      Mouth: Mucous membranes are moist.      Pharynx: Oropharynx is clear.   Eyes:      Conjunctiva/sclera: Conjunctivae normal.      Pupils: Pupils are equal, round, and reactive to light.   Neck:      Musculoskeletal: Normal range of motion and neck supple.      Thyroid: No thyromegaly.      Trachea: No tracheal deviation.   Cardiovascular:      Rate and Rhythm: Normal rate and regular rhythm.      Pulses: Normal pulses.      Heart sounds: Normal heart sounds. No murmur.   Pulmonary:      Effort: Pulmonary effort is normal.      Breath sounds: Normal breath sounds.   Abdominal:      General: Abdomen is flat. Bowel sounds are normal.      Palpations: Abdomen is soft.      Comments: obese   Genitourinary:     Comments: Per [name]  Musculoskeletal: Normal range of motion.         General: Tenderness (low back  occ .sx prn meds ) present.   Skin:     General: Skin is warm and dry.      Comments: Acne keloidas nuchae back of neck as noted    Neurological:      Mental Status: He is alert and oriented to person, place, and time.      Deep Tendon Reflexes: Reflexes are normal and symmetric.   Psychiatric:         Behavior: Behavior normal.         BP 128/78 (Site: Right Upper Arm, Position: Sitting, Cuff Size: Medium Adult)    Pulse 66    Resp 14    Ht 5\' 9"  (1.753 m)    Wt 234 lb (106.1 kg)    BMI 34.56 kg/m??       Assessment & Plan:         Gout  No recent symptoms cont meds and diet      Hyperlipidemia  Stable continue current meds and return in 3 mo.    Repeat labs in 3 mo.     HTN (hypertension)  Stable continue current meds and return in 3 mo.    Repeat labs in 3 mo.  Vitamin D deficiency  Continue current meds     Alcohol abuse  Cont Sober as noted     Lymphocytic colitis  occ symptoms as noted     Prostate CA ([place])  Cont F/U with [name] no new symptoms     Hyperglycemia  Diet controled

## 2018-06-29 NOTE — Assessment & Plan Note (Signed)
occ symptoms as noted

## 2018-06-29 NOTE — Assessment & Plan Note (Signed)
Continue current meds

## 2018-06-29 NOTE — Assessment & Plan Note (Signed)
Diet controled

## 2018-06-29 NOTE — Assessment & Plan Note (Signed)
Cont F/U with [name] no new symptoms

## 2018-08-03 MED ORDER — AMLODIPINE BESYLATE 5 MG PO TABS
5 MG | ORAL_TABLET | ORAL | 2 refills | Status: DC
Start: 2018-08-03 — End: 2019-05-11

## 2018-08-24 MED ORDER — POTASSIUM CHLORIDE CRYS ER 10 MEQ PO TBCR
10 MEQ | ORAL_TABLET | ORAL | 1 refills | Status: DC
Start: 2018-08-24 — End: 2019-03-31

## 2018-09-24 ENCOUNTER — Encounter

## 2018-09-25 LAB — LIPID PANEL
Cholesterol, Total: 167 mg/dL (ref 0–199)
HDL: 108 mg/dL — ABNORMAL HIGH (ref 40–60)
LDL Calculated: 40 mg/dL (ref <100)
Triglycerides: 97 mg/dL (ref 0–150)
VLDL Cholesterol Calculated: 19 mg/dL

## 2018-09-25 LAB — COMPREHENSIVE METABOLIC PANEL
ALT: 28 U/L (ref 10–40)
AST: 75 U/L — ABNORMAL HIGH (ref 15–37)
Albumin/Globulin Ratio: 1.8 (ref 1.1–2.2)
Albumin: 4.4 g/dL (ref 3.4–5.0)
Alkaline Phosphatase: 77 U/L (ref 40–129)
Anion Gap: 18 — ABNORMAL HIGH (ref 3–16)
BUN: 5 mg/dL — ABNORMAL LOW (ref 7–20)
CO2: 20 mmol/L — ABNORMAL LOW (ref 21–32)
Calcium: 9 mg/dL (ref 8.3–10.6)
Chloride: 98 mmol/L — ABNORMAL LOW (ref 99–110)
Creatinine: 0.6 mg/dL — ABNORMAL LOW (ref 0.9–1.3)
GFR African American: >60 (ref >60)
GFR Non-African American: >60 (ref >60)
Globulin: 2.5 g/dL
Glucose: 106 mg/dL — ABNORMAL HIGH (ref 70–99)
Potassium: 3.4 mmol/L — ABNORMAL LOW (ref 3.5–5.1)
Sodium: 136 mmol/L (ref 136–145)
Total Bilirubin: 0.5 mg/dL (ref 0.0–1.0)
Total Protein: 6.9 g/dL (ref 6.4–8.2)

## 2018-09-25 LAB — TSH WITH REFLEX: TSH: 1.2 u[IU]/mL (ref 0.27–4.20)

## 2018-09-25 LAB — CBC WITH AUTO DIFFERENTIAL
Basophils %: 0.4 %
Basophils Absolute: 0 10*3/uL (ref 0.0–0.2)
Eosinophils %: 0.5 %
Eosinophils Absolute: 0 10*3/uL (ref 0.0–0.6)
Hematocrit: 39.1 % — ABNORMAL LOW (ref 40.5–52.5)
Hemoglobin: 13 g/dL — ABNORMAL LOW (ref 13.5–17.5)
Lymphocytes %: 19.7 %
Lymphocytes Absolute: 1.3 10*3/uL (ref 1.0–5.1)
MCH: 36.8 pg — ABNORMAL HIGH (ref 26.0–34.0)
MCHC: 33.4 g/dL (ref 31.0–36.0)
MCV: 110.4 fL — ABNORMAL HIGH (ref 80.0–100.0)
MPV: 8 fL (ref 5.0–10.5)
Monocytes %: 7.8 %
Monocytes Absolute: 0.5 10*3/uL (ref 0.0–1.3)
Neutrophils %: 71.6 %
Neutrophils Absolute: 4.7 10*3/uL (ref 1.7–7.7)
Platelets: 173 10*3/uL (ref 135–450)
RBC: 3.54 M/uL — ABNORMAL LOW (ref 4.20–5.90)
RDW: 15.8 % — ABNORMAL HIGH (ref 12.4–15.4)
WBC: 6.6 10*3/uL (ref 4.0–11.0)

## 2018-09-25 LAB — PSA PROSTATIC SPECIFIC ANTIGEN: PSA: <0.01 ng/mL (ref 0.00–4.00)

## 2018-09-25 LAB — HEMOGLOBIN A1C
Hemoglobin A1C: 5.3 %
eAG: 105.4 mg/dL

## 2018-09-29 ENCOUNTER — Ambulatory Visit
Admit: 2018-09-29 | Discharge: 2018-09-29 | Payer: BLUE CROSS/BLUE SHIELD | Attending: Internal Medicine | Primary: Internal Medicine

## 2018-09-29 DIAGNOSIS — M109 Gout, unspecified: Secondary | ICD-10-CM

## 2018-09-29 NOTE — Assessment & Plan Note (Signed)
Diet controled

## 2018-09-29 NOTE — Progress Notes (Signed)
Subjective:      Patient ID: Phillip Harper is a 58 y.o. male.    HPI  Here today for follow up of chronic problems as per HPI and as problems listed under assessment and plan,no new c/o feels good     Hypertension   This is a chronic problem. The current episode started more than 1 year ago. The problem is unchanged. The problem is controlled. Associated symptoms include anxiety. Risk factors for coronary artery disease include dyslipidemia, male gender and obesity. Past treatments include beta blockers, lifestyle changes, calcium channel blockers and diuretics. The current treatment provides significant improvement. There are no compliance problems.    Hyperlipidemia   This is a chronic problem. The current episode started more than 1 year ago. The problem is controlled. Recent lipid tests were reviewed and are low. There are no known factors aggravating his hyperlipidemia. Current antihyperlipidemic treatment includes diet change, exercise and statins. The current treatment provides significant improvement of lipids. There are no compliance problems.  Risk factors for coronary artery disease include dyslipidemia, hypertension, male sex and obesity.       Allergies   Allergen Reactions   ??? Shellfish-Derived Products Swelling     ORAL EDEMA, ORAL ITCHING       Current Outpatient Medications   Medication Sig Dispense Refill   ??? potassium chloride (KLOR-CON M) 10 MEQ extended release tablet TAKE 1 TABLET BY MOUTH THREE TIMES DAILY 270 tablet 1   ??? amLODIPine (NORVASC) 5 MG tablet TAKE 1 TABLET BY MOUTH DAILY 90 tablet 2   ??? metoprolol succinate (TOPROL XL) 50 MG extended release tablet TAKE 1 TABLET BY MOUTH EVERY NIGHT 90 tablet 1   ??? simvastatin (ZOCOR) 40 MG tablet TAKE 1 TABLET BY MOUTH AT BEDTIME 90 tablet 1   ??? allopurinol (ZYLOPRIM) 300 MG tablet TAKE 1 TABLET BY MOUTH EVERY DAY 90 tablet 1   ??? hydrALAZINE (APRESOLINE) 25 MG tablet TAKE 1 TABLET BY MOUTH EVERY 8 HOURS 270 tablet 1   ??? budesonide (ENTOCORT EC)  3 MG extended release capsule Take 9 mg by mouth every morning Three capsules daily     ??? Pancrelipase, Lip-Prot-Amyl, (CREON PO) Take by mouth 3 times daily (with meals)      ??? Loperamide HCl (IMODIUM A-D PO) Take 2 tablets by mouth daily as needed      ??? indomethacin (INDOCIN) 25 MG capsule Take 25 mg by mouth as needed for Pain     ??? albuterol-ipratropium (COMBIVENT RESPIMAT) 20-100 MCG/ACT AERS inhaler Inhale 1 puff into the lungs every 6 hours 1 Inhaler 0   ??? colchicine (COLCRYS) 0.6 MG tablet 1 po prn for gout attack may repeat in 2 hours no more than 2/24 hrs 10 tablet 1   ??? Vitamin D (CHOLECALCIFEROL) 1000 UNITS CAPS capsule Take 2,000 Units by mouth daily.       No current facility-administered medications for this visit.        Past Medical History:   Diagnosis Date   ??? Alcoholism in recovery [facility]) 08/01/2017   ??? Cancer [facility]) 2016    Prostate [name]   ??? Clostridium difficile infection 08/01/2010   ??? Gout    ??? HTN (hypertension)    ??? Hyperlipidemia    ??? Keloids 12/2003    multiple keloids on head   ??? Pancreatitis, alcoholic 82/[id number]   ??? Vitamin D deficiency        Family History   Problem Relation Age of Onset   ???  Cancer Mother         lung CA   ??? Heart Disease Father         CAD   ??? Cancer Sister         lung CA       Past Surgical History:   Procedure Laterality Date   ??? COLONOSCOPY  02/04/13    Dr Fredonia Highland - normal repeat 2024   ??? COLONOSCOPY N/A 07/14/2017    COLONOSCOPY WITH BIOPSY performed by Waynard Reeds, MD at [place]   ??? LAPAROSCOPIC APPENDECTOMY N/A 09/11/2017    LAPAROSCOPIC APPENDECTOMY performed by Elly Modena, MD at [place]   ??? OTHER SURGICAL HISTORY      KELOID SURGERY   ??? PROSTATE BIOPSY     ??? PROSTATECTOMY Bilateral 12/20/14    robotic assisted laparoscopic   ??? UPPER GASTROINTESTINAL ENDOSCOPY N/A 07/23/2017    EGD WITH ESOPHAGEAL ENDOSCOPIC ULTRASOUND performed by York Spaniel, MD at [place]   ??? UPPER GASTROINTESTINAL ENDOSCOPY N/A 07/23/2017    EGD  BIOPSY performed by York Spaniel, MD at [place] History     Socioeconomic History   ??? Marital status: Legally Separated     Spouse name: Not on file   ??? Number of children: Not on file   ??? Years of education: Not on file   ??? Highest education level: Not on file   Occupational History   ??? Not on file   Social Needs   ??? Financial resource strain: Not on file   ??? Food insecurity:     Worry: Not on file     Inability: Not on file   ??? Transportation needs:     Medical: Not on file     Non-medical: Not on file   Tobacco Use   ??? Smoking status: Never Smoker   ??? Smokeless tobacco: Never Used   Substance and Sexual Activity   ??? Alcohol use: No     Alcohol/week: 0.0 standard drinks     Comment: SOBER SINCE 08-01-17   ??? Drug use: No   ??? Sexual activity: Yes     Partners: Female   Lifestyle   ??? Physical activity:     Days per week: Not on file     Minutes per session: Not on file   ??? Stress: Not on file   Relationships   ??? Social connections:     Talks on phone: Not on file     Gets together: Not on file     Attends religious service: Not on file     Active member of club or organization: Not on file     Attends meetings of clubs or organizations: Not on file     Relationship status: Not on file   ??? Intimate partner violence:     Fear of current or ex partner: Not on file     Emotionally abused: Not on file     Physically abused: Not on file     Forced sexual activity: Not on file   Other Topics Concern   ??? Not on file   Social History Narrative   ??? Not on file       Review of Systems  Review of Systems   Constitutional: Positive for unexpected weight change (down 2# ).   HENT: Negative.    Eyes: Negative.         Glasses  Respiratory: Negative.         Sleep study was within normal limits no  c-pap needed    Cardiovascular: Negative.    Gastrointestinal: Negative.    Endocrine: Negative.    Genitourinary: Positive for frequency and urgency.        Nocturia cont with [name]    Musculoskeletal:  Negative.         Gout is stable since taking allopurinol    Skin: Negative.         Acne keloidas nuchae back of neck as noted    Allergic/Immunologic: Negative.    Neurological: Negative.    Hematological: Negative.    Psychiatric/Behavioral: Negative.        Objective:   Physical Exam:  Physical Exam  Constitutional:       Appearance: He is well-developed. He is obese.   HENT:      Head: Normocephalic and atraumatic.      Right Ear: External ear normal.      Left Ear: External ear normal.      Nose: Nose normal.      Mouth/Throat:      Mouth: Mucous membranes are moist.      Pharynx: Oropharynx is clear.   Eyes:      Conjunctiva/sclera: Conjunctivae normal.      Pupils: Pupils are equal, round, and reactive to light.   Neck:      Musculoskeletal: Normal range of motion and neck supple.      Thyroid: No thyromegaly.      Trachea: No tracheal deviation.   Cardiovascular:      Rate and Rhythm: Normal rate and regular rhythm.      Pulses: Normal pulses.      Heart sounds: Normal heart sounds. No murmur.   Pulmonary:      Effort: Pulmonary effort is normal.      Breath sounds: Normal breath sounds.   Abdominal:      General: Abdomen is flat. Bowel sounds are normal.      Palpations: Abdomen is soft.      Comments: obese   Musculoskeletal: Normal range of motion.         General: Tenderness (low back  occ .sx prn meds ) present.   Skin:     General: Skin is warm and dry.      Comments: Acne keloidas nuchae back of neck as noted    Neurological:      Mental Status: He is alert and oriented to person, place, and time.      Deep Tendon Reflexes: Reflexes are normal and symmetric.   Psychiatric:         Behavior: Behavior normal.         BP 118/80 (Site: Right Upper Arm, Position: Sitting, Cuff Size: Medium Adult)    Pulse 66    Resp 14    Ht 5\' 9"  (1.753 m)    Wt 232 lb 12.8 oz (105.6 kg)    BMI 34.38 kg/m??       Assessment & Plan:         Gout  No recent symptoms cont current Tx     Hyperlipidemia  Very well controled  cont diet and meds     HTN (hypertension)  Stable continue current meds and return in 3 mo.    K a little low has not been taking med TID will now     Vitamin D deficiency  Remains stable  Alcohol abuse  Remains sober cont the good work     Lymphocytic colitis  Remains stable to see GI in the AM regarding cont diarrhea     Prostate CA ([place])  Remains stable last PSA was low     Hyperglycemia  Diet controled

## 2018-09-29 NOTE — Assessment & Plan Note (Signed)
Remains stable

## 2018-09-29 NOTE — Assessment & Plan Note (Signed)
Remains sober cont the good work

## 2018-09-29 NOTE — Assessment & Plan Note (Signed)
No recent symptoms cont current Tx

## 2018-09-29 NOTE — Assessment & Plan Note (Signed)
Remains stable to see GI in the AM regarding cont diarrhea

## 2018-09-29 NOTE — Assessment & Plan Note (Signed)
Remains stable last PSA was low

## 2018-09-29 NOTE — Assessment & Plan Note (Signed)
Stable continue current meds and return in 3 mo.    K a little low has not been taking med TID will now

## 2018-09-29 NOTE — Assessment & Plan Note (Signed)
Very well controled cont diet and meds

## 2018-11-16 MED ORDER — ALLOPURINOL 300 MG PO TABS
300 MG | ORAL_TABLET | ORAL | 1 refills | Status: DC
Start: 2018-11-16 — End: 2020-01-04

## 2018-11-16 MED ORDER — HYDRALAZINE HCL 25 MG PO TABS
25 MG | ORAL_TABLET | ORAL | 1 refills | Status: DC
Start: 2018-11-16 — End: 2020-01-04

## 2018-11-24 MED ORDER — SIMVASTATIN 40 MG PO TABS
40 MG | ORAL_TABLET | ORAL | 1 refills | Status: DC
Start: 2018-11-24 — End: 2019-09-01

## 2018-11-27 LAB — VITAMIN B12: Vitamin B-12: 227 pg/mL (ref 211–911)

## 2018-11-27 LAB — HEPATIC FUNCTION PANEL
ALT: 34 U/L (ref 10–40)
AST: 56 U/L — ABNORMAL HIGH (ref 15–37)
Albumin: 3.9 g/dL (ref 3.4–5.0)
Alkaline Phosphatase: 66 U/L (ref 40–129)
Bilirubin, Direct: <0.2 mg/dL (ref 0.0–0.3)
Total Bilirubin: 0.5 mg/dL (ref 0.0–1.0)
Total Protein: 6.7 g/dL (ref 6.4–8.2)

## 2018-11-27 LAB — IRON AND TIBC
Iron Saturation: 27 % (ref 20–50)
Iron: 77 ug/dL (ref 59–158)
TIBC: 290 ug/dL (ref 260–445)

## 2018-11-27 LAB — HEPATITIS B SURFACE ANTIGEN: Hep B S Ag Interp: NONREACTIVE

## 2018-11-27 LAB — PSA SCREENING: PSA: <0.01 ng/mL (ref 0.00–4.00)

## 2018-11-27 LAB — HEPATITIS A ANTIBODY, IGM: Hep A IgM: NONREACTIVE

## 2018-11-27 LAB — HEPATITIS B SURFACE ANTIBODY: Hep B S Ab: <3.5 m[IU]/mL

## 2018-11-27 LAB — FERRITIN: Ferritin: 52.4 ng/mL (ref 30.0–400.0)

## 2018-11-27 LAB — HEPATITIS C ANTIBODY: Hep C Ab Interp: NONREACTIVE

## 2018-11-27 LAB — ANA: ANA: NEGATIVE

## 2018-11-30 LAB — ALPHA-1-ANTITRYPSIN W/ PHENOTYPE: A-1 Antitrypsin: 140 mg/dL (ref 90–200)

## 2018-11-30 LAB — MISC ARUP 1

## 2018-11-30 LAB — HEPATITIS B CORE ANTIBODY, TOTAL: Hep B Core Total Ab: NEGATIVE

## 2018-12-01 LAB — CERULOPLASMIN: Ceruloplasmin: 12 mg/dL — ABNORMAL LOW (ref 15–30)

## 2018-12-01 LAB — AFP TUMOR MARKER: AFP (Alpha Fetoprotein): 12.4 ug/L — ABNORMAL HIGH (ref <8.4)

## 2018-12-01 LAB — MITOCHONDRIAL ANTIBODIES, M2: Mitochondrial M2 Ab, IgG: 10.5 Units (ref 0.0–20.0)

## 2018-12-01 LAB — F-ACTIN (SMOOTH MUSCLE) ANTIBODY W/ REFLEX TO TITER: F-ACTIN AB IGG: 14 Units (ref 0–19)

## 2018-12-11 ENCOUNTER — Encounter

## 2018-12-11 ENCOUNTER — Inpatient Hospital Stay: Admit: 2018-12-11 | Payer: BLUE CROSS/BLUE SHIELD | Primary: Internal Medicine

## 2018-12-11 DIAGNOSIS — R748 Abnormal levels of other serum enzymes: Secondary | ICD-10-CM

## 2018-12-11 LAB — BUN & CREATININE
BUN: 3 mg/dL — ABNORMAL LOW (ref 7–20)
Creatinine: <0.5 mg/dL — ABNORMAL LOW (ref 0.9–1.3)
GFR African American: >60 (ref >60)
GFR Non-African American: >60 (ref >60)

## 2018-12-11 MED ORDER — IOPAMIDOL 76 % IV SOLN
76 % | Freq: Once | INTRAVENOUS | Status: AC | PRN
Start: 2018-12-11 — End: 2018-12-11
  Administered 2018-12-11: 19:00:00 75 mL via INTRAVENOUS

## 2018-12-14 ENCOUNTER — Inpatient Hospital Stay: Payer: BLUE CROSS/BLUE SHIELD | Primary: Internal Medicine

## 2018-12-14 DIAGNOSIS — R748 Abnormal levels of other serum enzymes: Secondary | ICD-10-CM

## 2018-12-17 LAB — COPPER URINE
Copper, Ur: <1 ug/dL — ABNORMAL LOW (ref 0.3–3.2)
Creatinine, 24H Ur: 742 mg/d — ABNORMAL LOW (ref 800–2100)
Creatinine, Ur: 27 mg/dL
Hours Collected: 24
Urine Total Volume: 2750

## 2018-12-29 ENCOUNTER — Telehealth
Admit: 2018-12-29 | Discharge: 2018-12-29 | Payer: BLUE CROSS/BLUE SHIELD | Attending: Internal Medicine | Primary: Internal Medicine

## 2018-12-29 ENCOUNTER — Encounter: Attending: Internal Medicine | Primary: Internal Medicine

## 2018-12-29 DIAGNOSIS — I10 Essential (primary) hypertension: Secondary | ICD-10-CM

## 2018-12-29 NOTE — Assessment & Plan Note (Signed)
Will try to DC ETOH D/W pt and family

## 2018-12-29 NOTE — Assessment & Plan Note (Signed)
Diet controled check labs in 3 mo

## 2018-12-29 NOTE — Assessment & Plan Note (Signed)
Stable continue current meds and return in 3 mo.    Repeat labs in 3 mo.

## 2018-12-29 NOTE — Assessment & Plan Note (Signed)
Continue current meds

## 2018-12-29 NOTE — Progress Notes (Signed)
Subjective:      Patient ID: Phillip Harper is a 58 y.o. male.    HPI  Here today for follow up of chronic problems as per HPI and as problems listed under assessment and plan,no new c/o feels good but cont to drink a little- LFT and pancreatic findings as noted by GI  this is a VV 2nd to current COVID situation     Hypertension   This is a chronic problem. The current episode started more than 1 year ago. The problem is unchanged. The problem is controlled. Associated symptoms include anxiety. Risk factors for coronary artery disease include dyslipidemia, male gender and obesity. Past treatments include beta blockers, lifestyle changes, calcium channel blockers and diuretics. The current treatment provides significant improvement. There are no compliance problems.    Hyperlipidemia   This is a chronic problem. The current episode started more than 1 year ago. The problem is controlled. Recent lipid tests were reviewed and are low. There are no known factors aggravating his hyperlipidemia. Current antihyperlipidemic treatment includes diet change, exercise and statins. The current treatment provides significant improvement of lipids. There are no compliance problems.  Risk factors for coronary artery disease include dyslipidemia, hypertension, male sex and obesity.       Allergies   Allergen Reactions   ??? Shellfish-Derived Products Swelling     ORAL EDEMA, ORAL ITCHING       Current Outpatient Medications   Medication Sig Dispense Refill   ??? simvastatin (ZOCOR) 40 MG tablet TAKE 1 TABLET BY MOUTH AT BEDTIME 90 tablet 1   ??? hydrALAZINE (APRESOLINE) 25 MG tablet TAKE 1 TABLET BY MOUTH EVERY 8 HOURS 270 tablet 1   ??? allopurinol (ZYLOPRIM) 300 MG tablet TAKE 1 TABLET BY MOUTH EVERY DAY 90 tablet 1   ??? potassium chloride (KLOR-CON M) 10 MEQ extended release tablet TAKE 1 TABLET BY MOUTH THREE TIMES DAILY 270 tablet 1   ??? amLODIPine (NORVASC) 5 MG tablet TAKE 1 TABLET BY MOUTH DAILY 90 tablet 2   ??? metoprolol succinate  (TOPROL XL) 50 MG extended release tablet TAKE 1 TABLET BY MOUTH EVERY NIGHT 90 tablet 1   ??? budesonide (ENTOCORT EC) 3 MG extended release capsule Take 9 mg by mouth every morning Three capsules daily     ??? Pancrelipase, Lip-Prot-Amyl, (CREON PO) Take by mouth 3 times daily (with meals)      ??? Loperamide HCl (IMODIUM A-D PO) Take 2 tablets by mouth daily as needed      ??? indomethacin (INDOCIN) 25 MG capsule Take 25 mg by mouth as needed for Pain     ??? albuterol-ipratropium (COMBIVENT RESPIMAT) 20-100 MCG/ACT AERS inhaler Inhale 1 puff into the lungs every 6 hours 1 Inhaler 0   ??? colchicine (COLCRYS) 0.6 MG tablet 1 po prn for gout attack may repeat in 2 hours no more than 2/24 hrs 10 tablet 1   ??? Vitamin D (CHOLECALCIFEROL) 1000 UNITS CAPS capsule Take 2,000 Units by mouth daily.       No current facility-administered medications for this visit.        Past Medical History:   Diagnosis Date   ??? Alcoholism in recovery [facility]) 08/01/2017   ??? Cancer [facility]) 2016    Prostate [name]   ??? Clostridium difficile infection 08/01/2010   ??? Gout    ??? HTN (hypertension)    ??? Hyperlipidemia    ??? Keloids 12/2003    multiple keloids on head   ??? Pancreatitis,  alcoholic [id number]   ??? Vitamin D deficiency        Family History   Problem Relation Age of Onset   ??? Cancer Mother         lung CA   ??? Heart Disease Father         CAD   ??? Cancer Sister         lung CA       Past Surgical History:   Procedure Laterality Date   ??? COLONOSCOPY  02/04/13    Dr Fredonia [place] - normal repeat 2024   ??? COLONOSCOPY N/A 07/14/2017    COLONOSCOPY WITH BIOPSY performed by Waynard Reeds, MD at [place]   ??? LAPAROSCOPIC APPENDECTOMY N/A 09/11/2017    LAPAROSCOPIC APPENDECTOMY performed by Elly Modena, MD at [place]   ??? OTHER SURGICAL HISTORY      KELOID SURGERY   ??? PROSTATE BIOPSY     ??? PROSTATECTOMY Bilateral 12/20/14    robotic assisted laparoscopic   ??? UPPER GASTROINTESTINAL ENDOSCOPY N/A 07/23/2017    EGD WITH ESOPHAGEAL ENDOSCOPIC  ULTRASOUND performed by York Spaniel, MD at [place]   ??? UPPER GASTROINTESTINAL ENDOSCOPY N/A 07/23/2017    EGD BIOPSY performed by York Spaniel, MD at [place] History     Socioeconomic History   ??? Marital status: Legally Separated     Spouse name: Not on file   ??? Number of children: Not on file   ??? Years of education: Not on file   ??? Highest education level: Not on file   Occupational History   ??? Not on file   Social Needs   ??? Financial resource strain: Not on file   ??? Food insecurity     Worry: Not on file     Inability: Not on file   ??? Transportation needs     Medical: Not on file     Non-medical: Not on file   Tobacco Use   ??? Smoking status: Never Smoker   ??? Smokeless tobacco: Never Used   Substance and Sexual Activity   ??? Alcohol use: No     Alcohol/week: 0.0 standard drinks     Comment: SOBER SINCE 08-01-17   ??? Drug use: No   ??? Sexual activity: Yes     Partners: Female   Lifestyle   ??? Physical activity     Days per week: Not on file     Minutes per session: Not on file   ??? Stress: Not on file   Relationships   ??? Social Product manager on phone: Not on file     Gets together: Not on file     Attends religious service: Not on file     Active member of club or organization: Not on file     Attends meetings of clubs or organizations: Not on file     Relationship status: Not on file   ??? Intimate partner violence     Fear of current or ex partner: Not on file     Emotionally abused: Not on file     Physically abused: Not on file     Forced sexual activity: Not on file   Other Topics Concern   ??? Not on file   Social History Narrative   ??? Not on file       Review of Systems  Review of Systems   Constitutional: Positive  for unexpected weight change (down 2# ).   HENT: Negative.    Eyes: Negative.         Glasses   Respiratory: Negative.         Sleep study was within normal limits no  c-pap needed    Cardiovascular: Negative.    Gastrointestinal: Negative.    Endocrine:  Negative.    Genitourinary: Positive for frequency and urgency.        Nocturia cont with [name]    Musculoskeletal: Negative.         Gout is stable since taking allopurinol    Skin: Negative.         Acne keloidas nuchae back of neck as noted    Allergic/Immunologic: Negative.    Neurological: Negative.    Hematological: Negative.    Psychiatric/Behavioral: Negative.        Objective:   Physical Exam:  Physical Exam  Constitutional:       Appearance: Normal appearance. He is obese.   HENT:      Right Ear: External ear normal.      Left Ear: External ear normal.   Eyes:      Conjunctiva/sclera: Conjunctivae normal.   Neck:      Musculoskeletal: Normal range of motion.   Pulmonary:      Effort: Pulmonary effort is normal.   Abdominal:      Comments: obese   Musculoskeletal: Normal range of motion.   Skin:     Findings: No erythema or rash.   Neurological:      General: No focal deficit present.      Mental Status: He is alert and oriented to person, place, and time. Mental status is at baseline.   Psychiatric:         Mood and Affect: Mood normal.         Behavior: Behavior normal.         Thought Content: Thought content normal.         BP 133/69 (Site: Left Wrist, Position: Sitting, Cuff Size: Medium Adult) Comment: self reported   Pulse 80 Comment: self reported   Temp 98.7 ??F (37.1 ??C) (Oral) Comment: self reported   Ht 5\' 9"  (1.753 m)    Wt 237 lb (107.5 kg) Comment: self reported   BMI 35.00 kg/m??       Assessment & Plan:         Alcohol abuse  Will try to DC ETOH D/W pt and family     Gout  No recent symptoms check labs cont diet and meds     HTN (hypertension)  Stable continue current meds and return in 3 mo.    Repeat labs in 3 mo.     Hyperglycemia  Diet controled check labs in 3 mo     Hyperlipidemia  Stable continue current meds and return in 3 mo.    Repeat labs in 3 mo.     Lymphocytic colitis  Cont with GI Dr meds and diet as noted     Prostate CA ([place])  No symptoms check labs in 3 mo     Vitamin D  deficiency  Continue current meds     Abnormal LFTs  ? Related to ETOH and pancreatic findings see CT and GI note check labs in 3 mo    Phillip Harper is a 58 y.o. male being evaluated by a Virtual Visit (video visit) encounter to address concerns as mentioned above.  A caregiver  was present when appropriate. Due to this being a Scientist, physiological (During GGYIR-48 public health emergency), evaluation of the following organ systems was limited: Vitals/Constitutional/EENT/Resp/CV/GI/GU/MS/Neuro/Skin/Heme-Lymph-Imm.  Pursuant to the emergency declaration under the [place], [place] waiver authority and the [employer] Act, this Virtual Visit was conducted with patient's (and/or legal guardian's) consent, to reduce the patient's risk of exposure to COVID-19 and provide necessary medical care.  The patient (and/or legal guardian) has also been advised to contact this office for worsening conditions or problems, and seek emergency medical treatment and/or call 911 if deemed necessary.     Patient identification was verified at the start of the visit: Yes    Total time spent for this encounter: Not billed by time    Services were provided through a video synchronous discussion virtually to substitute for in-person clinic visit. Patient and provider were located at their individual homes.    --Ignacia Felling, MD on 12/29/2018 at 3:01 PM    An electronic signature was used to authenticate this note.

## 2018-12-29 NOTE — Assessment & Plan Note (Signed)
?   Related to ETOH and pancreatic findings see CT and GI note check labs in 3 mo

## 2018-12-29 NOTE — Assessment & Plan Note (Signed)
Cont with GI Dr meds and diet as noted

## 2018-12-29 NOTE — Assessment & Plan Note (Signed)
No symptoms check labs in 3 mo

## 2018-12-29 NOTE — Assessment & Plan Note (Signed)
No recent symptoms check labs cont diet and meds

## 2019-01-12 MED ORDER — METOPROLOL SUCCINATE ER 50 MG PO TB24
50 MG | ORAL_TABLET | ORAL | 1 refills | Status: DC
Start: 2019-01-12 — End: 2019-11-01

## 2019-03-27 ENCOUNTER — Encounter

## 2019-03-27 LAB — COMPREHENSIVE METABOLIC PANEL
ALT: 18 U/L (ref 10–40)
AST: 42 U/L — ABNORMAL HIGH (ref 15–37)
Albumin/Globulin Ratio: 1.2 (ref 1.1–2.2)
Albumin: 4.1 g/dL (ref 3.4–5.0)
Alkaline Phosphatase: 71 U/L (ref 40–129)
Anion Gap: 12 (ref 3–16)
BUN: 3 mg/dL — ABNORMAL LOW (ref 7–20)
CO2: 25 mmol/L (ref 21–32)
Calcium: 8.9 mg/dL (ref 8.3–10.6)
Chloride: 106 mmol/L (ref 99–110)
Creatinine: 0.6 mg/dL — ABNORMAL LOW (ref 0.9–1.3)
GFR African American: >60 (ref >60)
GFR Non-African American: >60 (ref >60)
Globulin: 3.4 g/dL
Glucose: 90 mg/dL (ref 70–99)
Potassium: 4.3 mmol/L (ref 3.5–5.1)
Sodium: 143 mmol/L (ref 136–145)
Total Bilirubin: 0.4 mg/dL (ref 0.0–1.0)
Total Protein: 7.5 g/dL (ref 6.4–8.2)

## 2019-03-27 LAB — PSA PROSTATIC SPECIFIC ANTIGEN: PSA: <0.01 ng/mL (ref 0.00–4.00)

## 2019-03-27 LAB — LIPID PANEL
Cholesterol, Total: 150 mg/dL (ref 0–199)
HDL: 90 mg/dL — ABNORMAL HIGH (ref 40–60)
LDL Calculated: 47 mg/dL (ref <100)
Triglycerides: 67 mg/dL (ref 0–150)
VLDL Cholesterol Calculated: 13 mg/dL

## 2019-03-27 LAB — CBC WITH AUTO DIFFERENTIAL
Basophils %: 0.1 %
Basophils Absolute: 0 10*3/uL (ref 0.0–0.2)
Eosinophils %: 0.7 %
Eosinophils Absolute: 0 10*3/uL (ref 0.0–0.6)
Hematocrit: 42.2 % (ref 40.5–52.5)
Hemoglobin: 13.9 g/dL (ref 13.5–17.5)
Lymphocytes %: 33.4 %
Lymphocytes Absolute: 2 10*3/uL (ref 1.0–5.1)
MCH: 37 pg — ABNORMAL HIGH (ref 26.0–34.0)
MCHC: 33 g/dL (ref 31.0–36.0)
MCV: 112.2 fL — ABNORMAL HIGH (ref 80.0–100.0)
MPV: 7.6 fL (ref 5.0–10.5)
Monocytes %: 5.7 %
Monocytes Absolute: 0.3 10*3/uL (ref 0.0–1.3)
Neutrophils %: 60.1 %
Neutrophils Absolute: 3.5 10*3/uL (ref 1.7–7.7)
Platelets: 226 10*3/uL (ref 135–450)
RBC: 3.76 M/uL — ABNORMAL LOW (ref 4.20–5.90)
RDW: 15.4 % (ref 12.4–15.4)
WBC: 5.9 10*3/uL (ref 4.0–11.0)

## 2019-03-27 LAB — TSH WITH REFLEX: TSH: 1.51 u[IU]/mL (ref 0.27–4.20)

## 2019-03-28 LAB — HEMOGLOBIN A1C
Hemoglobin A1C: 5.8 %
eAG: 119.8 mg/dL

## 2019-03-30 LAB — MISC ARUP 1

## 2019-03-31 ENCOUNTER — Ambulatory Visit
Admit: 2019-03-31 | Discharge: 2019-03-31 | Payer: BLUE CROSS/BLUE SHIELD | Attending: Internal Medicine | Primary: Internal Medicine

## 2019-03-31 DIAGNOSIS — R7989 Other specified abnormal findings of blood chemistry: Secondary | ICD-10-CM

## 2019-03-31 DIAGNOSIS — R945 Abnormal results of liver function studies: Secondary | ICD-10-CM

## 2019-03-31 MED ORDER — POTASSIUM CHLORIDE CRYS ER 10 MEQ PO TBCR
10 MEQ | ORAL_TABLET | ORAL | 1 refills | Status: DC
Start: 2019-03-31 — End: 2020-04-13

## 2019-03-31 NOTE — Assessment & Plan Note (Signed)
Improved cont to wear pads

## 2019-03-31 NOTE — Assessment & Plan Note (Signed)
No recent episodes

## 2019-03-31 NOTE — Assessment & Plan Note (Signed)
Cont to try to decrease see HPI has good support system

## 2019-03-31 NOTE — Assessment & Plan Note (Signed)
Improved amylase only slightly elevated

## 2019-03-31 NOTE — Assessment & Plan Note (Signed)
Lungs clear etc cont NSAIDS

## 2019-03-31 NOTE — Assessment & Plan Note (Signed)
Has scab but good ROM cont NSAIDS

## 2019-03-31 NOTE — Assessment & Plan Note (Signed)
Stable continue current meds and return in 3 mo.    has been under good control at home

## 2019-03-31 NOTE — Assessment & Plan Note (Signed)
Stable continue current meds and return in 3 mo.

## 2019-03-31 NOTE — Progress Notes (Signed)
Subjective:      Patient ID: Phillip Harper is a 58 y.o. male.    HPI  Here today for follow up of chronic problems as per HPI and as problems listed under assessment and plan,no new c/o feels good still cont to drink a few beers /week hard to go to meeting 2nd to [place] ,fell the other day carrying groceries hit L knee and R chest wall     Hypertension   This is a chronic problem. The current episode started more than 1 year ago. The problem is unchanged. The problem is controlled. Associated symptoms include anxiety. Risk factors for coronary artery disease include dyslipidemia, male gender and obesity. Past treatments include beta blockers, lifestyle changes, calcium channel blockers and diuretics. The current treatment provides significant improvement. There are no compliance problems.    Hyperlipidemia   This is a chronic problem. The current episode started more than 1 year ago. The problem is controlled. Recent lipid tests were reviewed and are low. There are no known factors aggravating his hyperlipidemia. Current antihyperlipidemic treatment includes diet change, exercise and statins. The current treatment provides significant improvement of lipids. There are no compliance problems.  Risk factors for coronary artery disease include dyslipidemia, hypertension, male sex and obesity.       Allergies   Allergen Reactions   ??? Shellfish-Derived Products Swelling     ORAL EDEMA, ORAL ITCHING       Current Outpatient Medications   Medication Sig Dispense Refill   ??? potassium chloride (KLOR-CON M) 10 MEQ extended release tablet TAKE 1 TABLET BY MOUTH THREE TIMES DAILY 270 tablet 1   ??? metoprolol succinate (TOPROL XL) 50 MG extended release tablet TAKE 1 TABLET BY MOUTH EVERY NIGHT 90 tablet 1   ??? simvastatin (ZOCOR) 40 MG tablet TAKE 1 TABLET BY MOUTH AT BEDTIME 90 tablet 1   ??? hydrALAZINE (APRESOLINE) 25 MG tablet TAKE 1 TABLET BY MOUTH EVERY 8 HOURS 270 tablet 1   ??? allopurinol (ZYLOPRIM) 300 MG tablet TAKE 1  TABLET BY MOUTH EVERY DAY 90 tablet 1   ??? amLODIPine (NORVASC) 5 MG tablet TAKE 1 TABLET BY MOUTH DAILY 90 tablet 2   ??? budesonide (ENTOCORT EC) 3 MG extended release capsule Take 9 mg by mouth every morning      ??? Pancrelipase, Lip-Prot-Amyl, (CREON PO) Take by mouth 3 times daily (with meals)      ??? Loperamide HCl (IMODIUM A-D PO) Take 2 tablets by mouth daily as needed      ??? indomethacin (INDOCIN) 25 MG capsule Take 25 mg by mouth as needed for Pain     ??? albuterol-ipratropium (COMBIVENT RESPIMAT) 20-100 MCG/ACT AERS inhaler Inhale 1 puff into the lungs every 6 hours 1 Inhaler 0   ??? colchicine (COLCRYS) 0.6 MG tablet 1 po prn for gout attack may repeat in 2 hours no more than 2/24 hrs 10 tablet 1   ??? Vitamin D (CHOLECALCIFEROL) 1000 UNITS CAPS capsule Take 2,000 Units by mouth daily.       No current facility-administered medications for this visit.        Past Medical History:   Diagnosis Date   ??? Alcoholism in recovery [facility]) 08/01/2017   ??? Cancer [facility]) 2016    Prostate [name]   ??? Clostridium difficile infection 08/01/2010   ??? Gout    ??? HTN (hypertension)    ??? Hyperlipidemia    ??? Keloids 12/2003    multiple keloids on head   ???  Pancreatitis, alcoholic [id number]   ??? Vitamin D deficiency        Family History   Problem Relation Age of Onset   ??? Cancer Mother         lung CA   ??? Heart Disease Father         CAD   ??? Cancer Sister         lung CA       Past Surgical History:   Procedure Laterality Date   ??? COLONOSCOPY  02/04/13    Dr Fredonia [place] - normal repeat 2024   ??? COLONOSCOPY N/A 07/14/2017    COLONOSCOPY WITH BIOPSY performed by Waynard Reeds, MD at [place]   ??? LAPAROSCOPIC APPENDECTOMY N/A 09/11/2017    LAPAROSCOPIC APPENDECTOMY performed by Elly Modena, MD at [place]   ??? OTHER SURGICAL HISTORY      KELOID SURGERY   ??? PROSTATE BIOPSY     ??? PROSTATECTOMY Bilateral 12/20/14    robotic assisted laparoscopic   ??? UPPER GASTROINTESTINAL ENDOSCOPY N/A 07/23/2017    EGD WITH ESOPHAGEAL ENDOSCOPIC  ULTRASOUND performed by York Spaniel, MD at [place]   ??? UPPER GASTROINTESTINAL ENDOSCOPY N/A 07/23/2017    EGD BIOPSY performed by York Spaniel, MD at [place] History     Socioeconomic History   ??? Marital status: Legally Separated     Spouse name: Not on file   ??? Number of children: Not on file   ??? Years of education: Not on file   ??? Highest education level: Not on file   Occupational History   ??? Not on file   Social Needs   ??? Financial resource strain: Not on file   ??? Food insecurity     Worry: Not on file     Inability: Not on file   ??? Transportation needs     Medical: Not on file     Non-medical: Not on file   Tobacco Use   ??? Smoking status: Never Smoker   ??? Smokeless tobacco: Never Used   Substance and Sexual Activity   ??? Alcohol use: No     Alcohol/week: 0.0 standard drinks     Comment: SOBER SINCE 08-01-17   ??? Drug use: No   ??? Sexual activity: Yes     Partners: Female   Lifestyle   ??? Physical activity     Days per week: Not on file     Minutes per session: Not on file   ??? Stress: Not on file   Relationships   ??? Social Product manager on phone: Not on file     Gets together: Not on file     Attends religious service: Not on file     Active member of club or organization: Not on file     Attends meetings of clubs or organizations: Not on file     Relationship status: Not on file   ??? Intimate partner violence     Fear of current or ex partner: Not on file     Emotionally abused: Not on file     Physically abused: Not on file     Forced sexual activity: Not on file   Other Topics Concern   ??? Not on file   Social History Narrative   ??? Not on file       Review of Systems  Review of Systems   Constitutional:  Positive for unexpected weight change (down a few # ).   HENT: Negative.    Eyes: Negative.         Glasses   Respiratory: Positive for chest tightness (RCM 2nd to fall ).         Sleep study was within normal limits no  c-pap needed    Cardiovascular: Negative.     Gastrointestinal: Negative.    Endocrine: Negative.    Genitourinary: Positive for frequency and urgency.        Nocturia cont with [name]    Musculoskeletal: Positive for gait problem (L knee 2nd to fall ).        Gout is stable since taking allopurinol    Skin: Negative.         Acne keloidas nuchae back of neck as noted    Allergic/Immunologic: Negative.    Hematological: Negative.    Psychiatric/Behavioral: Negative.        Objective:   Physical Exam:  Physical Exam  Constitutional:       Appearance: Normal appearance. He is obese.   HENT:      Right Ear: External ear normal.      Left Ear: External ear normal.   Eyes:      Conjunctiva/sclera: Conjunctivae normal.      Pupils: Pupils are equal, round, and reactive to light.   Neck:      Musculoskeletal: Normal range of motion and neck supple.   Cardiovascular:      Rate and Rhythm: Normal rate and regular rhythm.      Pulses: Normal pulses.      Heart sounds: Normal heart sounds.   Pulmonary:      Effort: Pulmonary effort is normal.   Chest:      Chest wall: Tenderness (RCM 2nd to fall ) present.   Abdominal:      General: Abdomen is flat. Bowel sounds are normal.      Palpations: Abdomen is soft.      Comments: obese   Musculoskeletal: Normal range of motion.         General: Tenderness (L knee mild pain and scab 2nd to fall ) present.   Skin:     General: Skin is warm.   Neurological:      General: No focal deficit present.      Mental Status: He is alert and oriented to person, place, and time. Mental status is at baseline.   Psychiatric:         Mood and Affect: Mood normal.         Behavior: Behavior normal.         Thought Content: Thought content normal.         BP 110/70 (Site: Right Upper Arm, Position: Sitting, Cuff Size: Medium Adult)    Pulse 68    Temp 99.3 ??F (37.4 ??C) (Oral)    Resp 14    Ht 5\' 9"  (1.753 m)    Wt 231 lb 6.4 oz (105 kg)    BMI 34.17 kg/m??       Assessment & Plan:         Abnormal LFTs  Improved amylase only slightly elevated      Alcohol abuse  Cont to try to decrease see HPI has good support system     Gout  No recent episodes     HTN (hypertension)  Stable continue current meds and return in 3 mo.    has been under good  control at home     Hyperglycemia  Diet controled HgA1C is normal     Hyperlipidemia  Stable continue current meds and return in 3 mo.        Lymphocytic colitis  Improved cont to wear pads     Prostate CA ([facility])  Remains stable cont F/U as noted     Vitamin D deficiency  Continue current meds     Chest pain on breathing  Lungs clear etc cont NSAIDS     Acute pain of left knee  Has scab but good ROM cont NSAIDS

## 2019-03-31 NOTE — Assessment & Plan Note (Signed)
Diet controled HgA1C is normal

## 2019-03-31 NOTE — Assessment & Plan Note (Signed)
Continue current meds

## 2019-03-31 NOTE — Assessment & Plan Note (Signed)
Remains stable cont F/U as noted

## 2019-05-11 MED ORDER — AMLODIPINE BESYLATE 5 MG PO TABS
5 MG | ORAL_TABLET | ORAL | 2 refills | Status: DC
Start: 2019-05-11 — End: 2019-06-08

## 2019-05-18 LAB — AFP TUMOR MARKER: AFP (Alpha Fetoprotein): 23 ug/L — ABNORMAL HIGH (ref <8.4)

## 2019-05-21 ENCOUNTER — Ambulatory Visit
Admit: 2019-05-21 | Discharge: 2019-05-21 | Payer: BLUE CROSS/BLUE SHIELD | Attending: Internal Medicine | Primary: Internal Medicine

## 2019-05-21 DIAGNOSIS — Z0000 Encounter for general adult medical examination without abnormal findings: Secondary | ICD-10-CM

## 2019-05-21 NOTE — Assessment & Plan Note (Signed)
Still needs to DC especially in light of abnormal LFT

## 2019-05-21 NOTE — Progress Notes (Signed)
Subjective:      Patient ID: Phillip Harper is a 58 y.o. male.    HPI  Here today for complete physical and review of chronic problems as listed under assessment and plan,no new c/o feels good     Hyperlipidemia   This is a chronic problem. The current episode started more than 1 year ago. The problem is controlled. Recent lipid tests were reviewed and are low. There are no known factors aggravating his hyperlipidemia. Current antihyperlipidemic treatment includes diet change, exercise and statins. The current treatment provides significant improvement of lipids. There are no compliance problems.  Risk factors for coronary artery disease include dyslipidemia, hypertension, male sex and obesity.   Hypertension   This is a chronic problem. The current episode started more than 1 year ago. The problem is unchanged. The problem is controlled. Associated symptoms include anxiety. Risk factors for coronary artery disease include dyslipidemia, male gender and obesity. Past treatments include beta blockers, lifestyle changes, calcium channel blockers and diuretics. The current treatment provides significant improvement. There are no compliance problems.        Allergies   Allergen Reactions   ??? Shellfish-Derived Products Swelling     ORAL EDEMA, ORAL ITCHING       Current Outpatient Medications   Medication Sig Dispense Refill   ??? amLODIPine (NORVASC) 5 MG tablet TAKE 1 TABLET BY MOUTH DAILY 90 tablet 2   ??? potassium chloride (KLOR-CON M) 10 MEQ extended release tablet TAKE 1 TABLET BY MOUTH THREE TIMES DAILY 270 tablet 1   ??? metoprolol succinate (TOPROL XL) 50 MG extended release tablet TAKE 1 TABLET BY MOUTH EVERY NIGHT 90 tablet 1   ??? simvastatin (ZOCOR) 40 MG tablet TAKE 1 TABLET BY MOUTH AT BEDTIME 90 tablet 1   ??? hydrALAZINE (APRESOLINE) 25 MG tablet TAKE 1 TABLET BY MOUTH EVERY 8 HOURS 270 tablet 1   ??? allopurinol (ZYLOPRIM) 300 MG tablet TAKE 1 TABLET BY MOUTH EVERY DAY 90 tablet 1   ??? budesonide (ENTOCORT EC) 3 MG  extended release capsule Take 9 mg by mouth every morning      ??? Pancrelipase, Lip-Prot-Amyl, (CREON PO) Take by mouth 3 times daily (with meals)      ??? Loperamide HCl (IMODIUM A-D PO) Take 2 tablets by mouth daily as needed      ??? indomethacin (INDOCIN) 25 MG capsule Take 25 mg by mouth as needed for Pain     ??? albuterol-ipratropium (COMBIVENT RESPIMAT) 20-100 MCG/ACT AERS inhaler Inhale 1 puff into the lungs every 6 hours 1 Inhaler 0   ??? colchicine (COLCRYS) 0.6 MG tablet 1 po prn for gout attack may repeat in 2 hours no more than 2/24 hrs 10 tablet 1   ??? Vitamin D (CHOLECALCIFEROL) 1000 UNITS CAPS capsule Take 2,000 Units by mouth daily.       No current facility-administered medications for this visit.        Past Medical History:   Diagnosis Date   ??? Alcoholism in recovery [facility]) 08/01/2017   ??? Cancer [facility]) 2016    Prostate [name]   ??? Clostridium difficile infection 08/01/2010   ??? Gout    ??? HTN (hypertension)    ??? Hyperlipidemia    ??? Keloids 12/2003    multiple keloids on head   ??? Pancreatitis, alcoholic [id number]   ??? Vitamin D deficiency        Family History   Problem Relation Age of Onset   ??? Cancer Mother  lung CA   ??? Heart Disease Father         CAD   ??? Cancer Sister         lung CA       Past Surgical History:   Procedure Laterality Date   ??? COLONOSCOPY  02/04/13    Dr Fredonia Highland - normal repeat 2024   ??? COLONOSCOPY N/A 07/14/2017    COLONOSCOPY WITH BIOPSY performed by Waynard Reeds, MD at [place]   ??? LAPAROSCOPIC APPENDECTOMY N/A 09/11/2017    LAPAROSCOPIC APPENDECTOMY performed by Elly Modena, MD at [place]   ??? OTHER SURGICAL HISTORY      KELOID SURGERY   ??? PROSTATE BIOPSY     ??? PROSTATECTOMY Bilateral 12/20/14    robotic assisted laparoscopic   ??? UPPER GASTROINTESTINAL ENDOSCOPY N/A 07/23/2017    EGD WITH ESOPHAGEAL ENDOSCOPIC ULTRASOUND performed by York Spaniel, MD at [place]   ??? UPPER GASTROINTESTINAL ENDOSCOPY N/A 07/23/2017    EGD BIOPSY performed by  York Spaniel, MD at [place] History     Socioeconomic History   ??? Marital status: Legally Separated     Spouse name: Not on file   ??? Number of children: Not on file   ??? Years of education: Not on file   ??? Highest education level: Not on file   Occupational History   ??? Not on file   Social Needs   ??? Financial resource strain: Not on file   ??? Food insecurity     Worry: Not on file     Inability: Not on file   ??? Transportation needs     Medical: Not on file     Non-medical: Not on file   Tobacco Use   ??? Smoking status: Never Smoker   ??? Smokeless tobacco: Never Used   Substance and Sexual Activity   ??? Alcohol use: No     Alcohol/week: 0.0 standard drinks     Comment: SOBER SINCE 08-01-17   ??? Drug use: No   ??? Sexual activity: Yes     Partners: Female   Lifestyle   ??? Physical activity     Days per week: Not on file     Minutes per session: Not on file   ??? Stress: Not on file   Relationships   ??? Social Product manager on phone: Not on file     Gets together: Not on file     Attends religious service: Not on file     Active member of club or organization: Not on file     Attends meetings of clubs or organizations: Not on file     Relationship status: Not on file   ??? Intimate partner violence     Fear of current or ex partner: Not on file     Emotionally abused: Not on file     Physically abused: Not on file     Forced sexual activity: Not on file   Other Topics Concern   ??? Not on file   Social History Narrative   ??? Not on file       Review of Systems  Review of Systems   Constitutional: Negative.  Unexpected weight change: up a few.   HENT: Negative.    Eyes: Negative.         Glasses   Respiratory: Negative.  Sleep study was within normal limits no  c-pap needed    Cardiovascular: Negative.    Gastrointestinal: Negative.    Endocrine: Negative.    Genitourinary: Positive for frequency and urgency.        Nocturia    Musculoskeletal: Negative.         Gout is stable since taking  allopurinol    Skin: Negative.         Acne keloidas nuchae back of neck as noted    Allergic/Immunologic: Negative.    Neurological: Negative.    Hematological: Negative.    Psychiatric/Behavioral: Negative.        Objective:   Physical Exam:  Physical Exam  Constitutional:       Appearance: He is well-developed. He is obese.   HENT:      Head: Normocephalic and atraumatic.      Right Ear: Tympanic membrane, ear canal and external ear normal.      Left Ear: Tympanic membrane, ear canal and external ear normal.   Eyes:      Extraocular Movements: Extraocular movements intact.      Conjunctiva/sclera: Conjunctivae normal.      Pupils: Pupils are equal, round, and reactive to light.   Neck:      Musculoskeletal: Normal range of motion and neck supple.      Thyroid: No thyromegaly.      Trachea: No tracheal deviation.   Cardiovascular:      Rate and Rhythm: Normal rate and regular rhythm.      Heart sounds: Normal heart sounds. No murmur.   Pulmonary:      Effort: Pulmonary effort is normal.      Breath sounds: Normal breath sounds.   Abdominal:      General: Abdomen is flat. Bowel sounds are normal.      Palpations: Abdomen is soft.      Comments: obese   Genitourinary:     Comments: Per [name]  Musculoskeletal: Normal range of motion.         General: Tenderness (low back ) present.   Skin:     General: Skin is warm and dry.      Comments: Acne keloidas nuchae back of neck as noted    Neurological:      Mental Status: He is alert and oriented to person, place, and time.      Deep Tendon Reflexes: Reflexes are normal and symmetric.   Psychiatric:         Behavior: Behavior normal.         BP 124/70 (Site: Right Upper Arm, Position: Sitting, Cuff Size: Medium Adult)    Pulse 68    Temp 99.3 ??F (37.4 ??C) (Oral)    Resp 16    Ht 5\' 9"  (1.753 m)    Wt 241 lb 12.8 oz (109.7 kg)    BMI 35.71 kg/m??       Assessment & Plan:         Abnormal LFTs  Slight decrease in alt but AFP is more elevated as per GI to have MRI of liver  since CT was ok     Acne keloidalis nuchae  improved no current  Tx     Acute pain of left knee  Improved no recent  Falls     Alcohol abuse  Still needs to DC especially in light of abnormal LFT     Gout  No recent symptoms     HTN (hypertension)  Stable continue  current meds and return in 3 mo.    BP has been ok at home     Hyperglycemia  Diet controled     Hyperlipidemia  Stable continue current meds and return in 3 mo.    Repeat labs in 3 mo.     Lymphocytic colitis  Stable for now     Prostate CA (HCC)  PSA still <0.01cont F/U with [name]     Vitamin D deficiency  Continue current meds     Well adult exam  Within normal limits for age- cont to work no ADL issues,immunizations up to date, no depression ,no cognitive impairment  Colonoscopy up to date mild elevation of LFT and AFP   Eye exam up to date  Exercises as tolerated    No living will but does not want resuscitation   Findings and recommendations discussed with Pt

## 2019-05-21 NOTE — Assessment & Plan Note (Signed)
improved no current  Tx

## 2019-05-21 NOTE — Assessment & Plan Note (Signed)
Within normal limits for age- cont to work no ADL issues,immunizations up to date, no depression ,no cognitive impairment  Colonoscopy up to date mild elevation of LFT and AFP   Eye exam up to date  Exercises as tolerated    No living will but does not want resuscitation   Findings and recommendations discussed with Pt

## 2019-05-21 NOTE — Assessment & Plan Note (Signed)
Stable continue current meds and return in 3 mo.    BP has been ok at home

## 2019-05-21 NOTE — Assessment & Plan Note (Signed)
Slight decrease in alt but AFP is more elevated as per GI to have MRI of liver since CT was ok

## 2019-05-21 NOTE — Assessment & Plan Note (Signed)
No recent symptoms

## 2019-05-21 NOTE — Assessment & Plan Note (Signed)
Improved no recent  Falls

## 2019-05-21 NOTE — Assessment & Plan Note (Signed)
PSA still <0.01cont F/U with [name]

## 2019-05-21 NOTE — Assessment & Plan Note (Signed)
Stable for now

## 2019-05-21 NOTE — Assessment & Plan Note (Signed)
Stable continue current meds and return in 3 mo.    Repeat labs in 3 mo.

## 2019-05-21 NOTE — Assessment & Plan Note (Signed)
Continue current meds

## 2019-05-21 NOTE — Progress Notes (Signed)
Vaccine Information Sheet, "Influenza - Inactivated"  given to Phillip Harper, or parent/legal guardian of  Phillip Harper and verbalized understanding.    Patient responses:    Have you ever had a reaction to a flu vaccine? No  Do you have any current illness?  No  Have you ever had Guillian Barre Syndrome?  No  Do you have a serious allergy to any of the follow: Neomycin, Polymyxin, Thimerosal, eggs or egg products? No    Flu vaccine given per order. Please see immunization tab.    Risks and benefits explained.  Current VIS given.      Immunizations Administered     Name Date Dose Route    Influenza, Quadv, IM, (6 mo and older Fluzone, Flulaval, Fluarix and 3 yrs and older Afluria) 05/21/2019 0.5 mL Intramuscular    Site: Deltoid- Left    Lot: [id number]    NDC: [id number]

## 2019-05-21 NOTE — Assessment & Plan Note (Signed)
Diet controled

## 2019-06-08 MED ORDER — AMLODIPINE BESYLATE 5 MG PO TABS
5 MG | ORAL_TABLET | ORAL | 2 refills | Status: DC
Start: 2019-06-08 — End: 2020-03-03

## 2019-08-14 ENCOUNTER — Encounter

## 2019-08-14 LAB — COMPREHENSIVE METABOLIC PANEL
ALT: 16 U/L (ref 10–40)
AST: 44 U/L — ABNORMAL HIGH (ref 15–37)
Albumin/Globulin Ratio: 1.1 (ref 1.1–2.2)
Albumin: 3.7 g/dL (ref 3.4–5.0)
Alkaline Phosphatase: 77 U/L (ref 40–129)
Anion Gap: 12 (ref 3–16)
BUN: 4 mg/dL — ABNORMAL LOW (ref 7–20)
CO2: 24 mmol/L (ref 21–32)
Calcium: 9.2 mg/dL (ref 8.3–10.6)
Chloride: 102 mmol/L (ref 99–110)
Creatinine: 0.6 mg/dL — ABNORMAL LOW (ref 0.9–1.3)
GFR African American: >60 (ref >60)
GFR Non-African American: >60 (ref >60)
Globulin: 3.3 g/dL
Glucose: 88 mg/dL (ref 70–99)
Potassium: 4.7 mmol/L (ref 3.5–5.1)
Sodium: 138 mmol/L (ref 136–145)
Total Bilirubin: 1 mg/dL (ref 0.0–1.0)
Total Protein: 7 g/dL (ref 6.4–8.2)

## 2019-08-14 LAB — LIPID PANEL
Cholesterol, Total: 173 mg/dL (ref 0–199)
HDL: 100 mg/dL — ABNORMAL HIGH (ref 40–60)
LDL Calculated: 59 mg/dL (ref <100)
Triglycerides: 70 mg/dL (ref 0–150)
VLDL Cholesterol Calculated: 14 mg/dL

## 2019-08-14 LAB — CBC WITH AUTO DIFFERENTIAL
Basophils %: 0.3 %
Basophils Absolute: 0 10*3/uL (ref 0.0–0.2)
Eosinophils %: 1.2 %
Eosinophils Absolute: 0.1 10*3/uL (ref 0.0–0.6)
Hematocrit: 38.4 % — ABNORMAL LOW (ref 40.5–52.5)
Hemoglobin: 12.9 g/dL — ABNORMAL LOW (ref 13.5–17.5)
Lymphocytes %: 31.7 %
Lymphocytes Absolute: 2.2 10*3/uL (ref 1.0–5.1)
MCH: 37.7 pg — ABNORMAL HIGH (ref 26.0–34.0)
MCHC: 33.6 g/dL (ref 31.0–36.0)
MCV: 112 fL — ABNORMAL HIGH (ref 80.0–100.0)
MPV: 7.9 fL (ref 5.0–10.5)
Monocytes %: 7 %
Monocytes Absolute: 0.5 10*3/uL (ref 0.0–1.3)
Neutrophils %: 59.8 %
Neutrophils Absolute: 4.1 10*3/uL (ref 1.7–7.7)
Platelets: 209 10*3/uL (ref 135–450)
RBC: 3.43 M/uL — ABNORMAL LOW (ref 4.20–5.90)
RDW: 19.2 % — ABNORMAL HIGH (ref 12.4–15.4)
WBC: 6.9 10*3/uL (ref 4.0–11.0)

## 2019-08-14 LAB — TSH WITH REFLEX: TSH: 4.63 u[IU]/mL — ABNORMAL HIGH (ref 0.27–4.20)

## 2019-08-15 LAB — T4, FREE: T4 Free: 0.9 ng/dL (ref 0.9–1.8)

## 2019-08-15 LAB — HEMOGLOBIN A1C
Hemoglobin A1C: 5.8 %
eAG: 119.8 mg/dL

## 2019-08-17 LAB — AFP TUMOR MARKER: AFP (Alpha Fetoprotein): 10.2 ug/L — ABNORMAL HIGH (ref <8.4)

## 2019-08-23 ENCOUNTER — Encounter: Attending: Internal Medicine | Primary: Internal Medicine

## 2019-08-23 ENCOUNTER — Encounter

## 2019-08-23 ENCOUNTER — Inpatient Hospital Stay: Admit: 2019-08-23 | Payer: BLUE CROSS/BLUE SHIELD | Primary: Internal Medicine

## 2019-08-23 ENCOUNTER — Ambulatory Visit
Admit: 2019-08-23 | Discharge: 2019-08-23 | Payer: BLUE CROSS/BLUE SHIELD | Attending: Internal Medicine | Primary: Internal Medicine

## 2019-08-23 DIAGNOSIS — R772 Abnormality of alphafetoprotein: Secondary | ICD-10-CM

## 2019-08-23 DIAGNOSIS — M109 Gout, unspecified: Secondary | ICD-10-CM

## 2019-08-23 MED ORDER — SODIUM CHLORIDE 0.9 % IV SOLN
0.9 | INTRAVENOUS | Status: AC
Start: 2019-08-23 — End: 2019-08-23

## 2019-08-23 MED ORDER — NORMAL SALINE FLUSH 0.9 % IV SOLN
0.9 | INTRAVENOUS | Status: AC
Start: 2019-08-23 — End: 2019-08-23

## 2019-08-23 MED ORDER — GADOBENATE DIMEGLUMINE 529 MG/ML IV SOLN
529 | INTRAVENOUS | Status: AC
Start: 2019-08-23 — End: 2019-08-23

## 2019-08-23 MED ORDER — GADOBENATE DIMEGLUMINE 529 MG/ML IV SOLN
529 MG/ML | Freq: Once | INTRAVENOUS | Status: AC | PRN
Start: 2019-08-23 — End: 2019-08-23
  Administered 2019-08-23: 13:00:00 23 mL via INTRAVENOUS

## 2019-08-23 MED ORDER — SODIUM CHLORIDE 0.9 % IV SOLN
0.9 % | Freq: Once | INTRAVENOUS | Status: AC
Start: 2019-08-23 — End: 2019-08-23
  Administered 2019-08-23: 13:00:00 via INTRAVENOUS

## 2019-08-23 MED FILL — SODIUM CHLORIDE 0.9 % IV SOLN: 0.9 % | INTRAVENOUS | Qty: 100

## 2019-08-23 MED FILL — SODIUM CHLORIDE FLUSH 0.9 % IV SOLN: 0.9 % | INTRAVENOUS | Qty: 10

## 2019-08-23 MED FILL — MULTIHANCE 529 MG/ML IV SOLN: 529 MG/ML | INTRAVENOUS | Qty: 30

## 2019-08-23 MED FILL — SODIUM CHLORIDE 0.9 % IV SOLN: 0.9 % | INTRAVENOUS | Qty: 1000

## 2019-09-01 MED ORDER — SIMVASTATIN 40 MG PO TABS
40 MG | ORAL_TABLET | ORAL | 1 refills | Status: DC
Start: 2019-09-01 — End: 2020-05-23

## 2019-11-01 MED ORDER — METOPROLOL SUCCINATE ER 50 MG PO TB24
50 MG | ORAL_TABLET | ORAL | 1 refills | Status: DC
Start: 2019-11-01 — End: 2020-08-18

## 2019-11-22 ENCOUNTER — Ambulatory Visit
Admit: 2019-11-22 | Discharge: 2019-11-22 | Payer: BLUE CROSS/BLUE SHIELD | Attending: Internal Medicine | Primary: Internal Medicine

## 2019-11-22 DIAGNOSIS — I10 Essential (primary) hypertension: Secondary | ICD-10-CM

## 2019-11-29 ENCOUNTER — Encounter

## 2019-12-11 ENCOUNTER — Inpatient Hospital Stay: Payer: BLUE CROSS/BLUE SHIELD | Primary: Internal Medicine

## 2019-12-11 DIAGNOSIS — R945 Abnormal results of liver function studies: Secondary | ICD-10-CM

## 2019-12-11 LAB — COMPREHENSIVE METABOLIC PANEL
ALT: 13 U/L (ref 10–40)
AST: 27 U/L (ref 15–37)
Albumin/Globulin Ratio: 1.1 (ref 1.1–2.2)
Albumin: 4 g/dL (ref 3.4–5.0)
Alkaline Phosphatase: 75 U/L (ref 40–129)
Anion Gap: 10 (ref 3–16)
BUN: 3 mg/dL — ABNORMAL LOW (ref 7–20)
CO2: 27 mmol/L (ref 21–32)
Calcium: 9.2 mg/dL (ref 8.3–10.6)
Chloride: 99 mmol/L (ref 99–110)
Creatinine: 0.6 mg/dL — ABNORMAL LOW (ref 0.9–1.3)
GFR African American: >60 (ref >60)
GFR Non-African American: >60 (ref >60)
Globulin: 3.5 g/dL
Glucose: 85 mg/dL (ref 70–99)
Potassium: 4.2 mmol/L (ref 3.5–5.1)
Sodium: 136 mmol/L (ref 136–145)
Total Bilirubin: 0.6 mg/dL (ref 0.0–1.0)
Total Protein: 7.5 g/dL (ref 6.4–8.2)

## 2019-12-11 LAB — CBC WITH AUTO DIFFERENTIAL
Basophils %: 0.4 %
Basophils Absolute: 0 10*3/uL (ref 0.0–0.2)
Eosinophils %: 0.8 %
Eosinophils Absolute: 0.1 10*3/uL (ref 0.0–0.6)
Hematocrit: 40 % — ABNORMAL LOW (ref 40.5–52.5)
Hemoglobin: 13.4 g/dL — ABNORMAL LOW (ref 13.5–17.5)
Lymphocytes %: 18.7 %
Lymphocytes Absolute: 1.5 10*3/uL (ref 1.0–5.1)
MCH: 35.2 pg — ABNORMAL HIGH (ref 26.0–34.0)
MCHC: 33.6 g/dL (ref 31.0–36.0)
MCV: 104.5 fL — ABNORMAL HIGH (ref 80.0–100.0)
MPV: 8.3 fL (ref 5.0–10.5)
Monocytes %: 9.1 %
Monocytes Absolute: 0.7 10*3/uL (ref 0.0–1.3)
Neutrophils %: 71 %
Neutrophils Absolute: 5.6 10*3/uL (ref 1.7–7.7)
Platelets: 215 10*3/uL (ref 135–450)
RBC: 3.82 M/uL — ABNORMAL LOW (ref 4.20–5.90)
RDW: 14 % (ref 12.4–15.4)
WBC: 7.9 10*3/uL (ref 4.0–11.0)

## 2019-12-11 LAB — LIPID PANEL
Cholesterol, Total: 155 mg/dL (ref 0–199)
HDL: 98 mg/dL — ABNORMAL HIGH (ref 40–60)
LDL Calculated: 47 mg/dL (ref <100)
Triglycerides: 51 mg/dL (ref 0–150)
VLDL Cholesterol Calculated: 10 mg/dL

## 2019-12-11 LAB — HEPATIC FUNCTION PANEL: Bilirubin, Direct: <0.2 mg/dL (ref 0.0–0.3)

## 2019-12-11 LAB — PSA PROSTATIC SPECIFIC ANTIGEN: PSA: <0.01 ng/mL (ref 0.00–4.00)

## 2019-12-11 LAB — TSH WITH REFLEX: TSH: 2.64 u[IU]/mL (ref 0.27–4.20)

## 2019-12-11 LAB — VITAMIN D 25 HYDROXY: Vit D, 25-Hydroxy: 51 ng/mL (ref >=30)

## 2019-12-11 LAB — IGA: IgA: 366 mg/dL (ref 70.0–400.0)

## 2019-12-12 LAB — HEMOGLOBIN A1C
Hemoglobin A1C: 5.6 %
eAG: 114 mg/dL

## 2019-12-12 LAB — TISSUE TRANSGLUTAMINASE, IGA: Tissue Transglutaminase IgA: <0.5 U/mL (ref 0.0–14.0)

## 2019-12-12 LAB — O&P SCREEN(CRYPTOSPORIDIUM/GIARDIA/E.HISTOLYTICA) #1
Cryptosporidium Ag: NEGATIVE
E Histolytica Ag: NEGATIVE
Giardia Ag, Stl: NEGATIVE

## 2019-12-12 LAB — GASTROINTESTINAL PANEL, MOLECULAR: GI Bacterial Pathogens By PCR: NOT DETECTED

## 2019-12-12 LAB — TSH: TSH: 2.67 u[IU]/mL (ref 0.27–4.20)

## 2019-12-14 LAB — AFP TUMOR MARKER: AFP (Alpha Fetoprotein): 8.1 ug/L (ref <8.4)

## 2020-01-04 MED ORDER — ALLOPURINOL 300 MG PO TABS
300 MG | ORAL_TABLET | ORAL | 1 refills | Status: DC
Start: 2020-01-04 — End: 2020-04-19

## 2020-01-04 MED ORDER — HYDRALAZINE HCL 25 MG PO TABS
25 MG | ORAL_TABLET | ORAL | 1 refills | Status: DC
Start: 2020-01-04 — End: 2020-09-25

## 2020-01-06 ENCOUNTER — Inpatient Hospital Stay: Admit: 2020-01-06 | Payer: BLUE CROSS/BLUE SHIELD | Primary: Internal Medicine

## 2020-01-06 DIAGNOSIS — R772 Abnormality of alphafetoprotein: Secondary | ICD-10-CM

## 2020-01-06 MED ORDER — GADOBENATE DIMEGLUMINE 529 MG/ML IV SOLN
529 MG/ML | Freq: Once | INTRAVENOUS | Status: AC | PRN
Start: 2020-01-06 — End: 2020-01-06
  Administered 2020-01-06: 12:00:00 22 mL via INTRAVENOUS

## 2020-01-06 MED ORDER — NORMAL SALINE FLUSH 0.9 % IV SOLN
0.9 | INTRAVENOUS | Status: AC
Start: 2020-01-06 — End: 2020-01-06

## 2020-01-06 MED ORDER — GADOBENATE DIMEGLUMINE 529 MG/ML IV SOLN
529 | INTRAVENOUS | Status: AC
Start: 2020-01-06 — End: 2020-01-06

## 2020-01-06 MED ORDER — SODIUM CHLORIDE 0.9 % IV SOLN
0.9 | INTRAVENOUS | Status: AC
Start: 2020-01-06 — End: 2020-01-06

## 2020-01-06 MED ORDER — SODIUM CHLORIDE 0.9 % IV SOLN
0.9 % | Freq: Once | INTRAVENOUS | Status: AC
Start: 2020-01-06 — End: 2020-01-06
  Administered 2020-01-06: 12:00:00 via INTRAVENOUS

## 2020-01-06 MED FILL — SODIUM CHLORIDE FLUSH 0.9 % IV SOLN: 0.9 % | INTRAVENOUS | Qty: 10

## 2020-01-06 MED FILL — SODIUM CHLORIDE 0.9 % IV SOLN: 0.9 % | INTRAVENOUS | Qty: 100

## 2020-01-06 MED FILL — MULTIHANCE 529 MG/ML IV SOLN: 529 mg/mL | INTRAVENOUS | Qty: 30

## 2020-02-21 ENCOUNTER — Ambulatory Visit
Admit: 2020-02-21 | Discharge: 2020-02-21 | Payer: BLUE CROSS/BLUE SHIELD | Attending: Internal Medicine | Primary: Internal Medicine

## 2020-02-21 DIAGNOSIS — I10 Essential (primary) hypertension: Secondary | ICD-10-CM

## 2020-03-03 MED ORDER — AMLODIPINE BESYLATE 5 MG PO TABS
5 MG | ORAL_TABLET | ORAL | 0 refills | Status: DC
Start: 2020-03-03 — End: 2020-06-07

## 2020-04-11 ENCOUNTER — Encounter

## 2020-04-13 MED ORDER — POTASSIUM CHLORIDE CRYS ER 10 MEQ PO TBCR
10 MEQ | ORAL_TABLET | ORAL | 1 refills | Status: DC
Start: 2020-04-13 — End: 2020-11-21

## 2020-04-19 MED ORDER — ALLOPURINOL 300 MG PO TABS
300 MG | ORAL_TABLET | ORAL | 1 refills | Status: DC
Start: 2020-04-19 — End: 2020-08-21

## 2020-04-24 LAB — PSA: PSA, TOTAL: <0.01 ng/mL (ref 0.00–4.00)

## 2020-05-15 ENCOUNTER — Inpatient Hospital Stay: Payer: BLUE CROSS/BLUE SHIELD | Primary: Internal Medicine

## 2020-05-15 DIAGNOSIS — E559 Vitamin D deficiency, unspecified: Secondary | ICD-10-CM

## 2020-05-15 LAB — CBC WITH AUTO DIFFERENTIAL
Basophils %: 0.5 %
Basophils Absolute: 0 10*3/uL (ref 0.0–0.2)
Eosinophils %: 7.8 %
Eosinophils Absolute: 0.5 10*3/uL (ref 0.0–0.6)
Hematocrit: 37 % — ABNORMAL LOW (ref 40.5–52.5)
Hemoglobin: 12.3 g/dL — ABNORMAL LOW (ref 13.5–17.5)
Lymphocytes %: 35.5 %
Lymphocytes Absolute: 2.3 10*3/uL (ref 1.0–5.1)
MCH: 33.4 pg (ref 26.0–34.0)
MCHC: 33.2 g/dL (ref 31.0–36.0)
MCV: 100.5 fL — ABNORMAL HIGH (ref 80.0–100.0)
MPV: 8.8 fL (ref 5.0–10.5)
Monocytes %: 8.6 %
Monocytes Absolute: 0.6 10*3/uL (ref 0.0–1.3)
Neutrophils %: 47.6 %
Neutrophils Absolute: 3.1 10*3/uL (ref 1.7–7.7)
Platelets: 185 10*3/uL (ref 135–450)
RBC: 3.68 M/uL — ABNORMAL LOW (ref 4.20–5.90)
RDW: 14.5 % (ref 12.4–15.4)
WBC: 6.6 10*3/uL (ref 4.0–11.0)

## 2020-05-15 LAB — URINALYSIS WITH REFLEX TO CULTURE
Bilirubin Urine: NEGATIVE
Blood, Urine: NEGATIVE
Glucose, Ur: NEGATIVE mg/dL
Ketones, Urine: NEGATIVE mg/dL
Leukocyte Esterase, Urine: NEGATIVE
Nitrite, Urine: NEGATIVE
Protein, UA: NEGATIVE mg/dL
Specific Gravity, UA: 1.013 (ref 1.005–1.030)
Urobilinogen, Urine: 0.2 E.U./dL (ref <2.0)
pH, UA: 5.5 (ref 5.0–8.0)

## 2020-05-15 LAB — COMPREHENSIVE METABOLIC PANEL
ALT: 15 U/L (ref 10–40)
AST: 39 U/L — ABNORMAL HIGH (ref 15–37)
Albumin/Globulin Ratio: 0.9 — ABNORMAL LOW (ref 1.1–2.2)
Albumin: 3.6 g/dL (ref 3.4–5.0)
Alkaline Phosphatase: 91 U/L (ref 40–129)
Anion Gap: 15 (ref 3–16)
BUN: <2 mg/dL — ABNORMAL LOW (ref 7–20)
CO2: 21 mmol/L (ref 21–32)
Calcium: 9.3 mg/dL (ref 8.3–10.6)
Chloride: 107 mmol/L (ref 99–110)
Creatinine: 0.5 mg/dL — ABNORMAL LOW (ref 0.9–1.3)
GFR African American: >60 (ref >60)
GFR Non-African American: >60 (ref >60)
Globulin: 3.9 g/dL
Glucose: 106 mg/dL — ABNORMAL HIGH (ref 70–99)
Potassium: 4.1 mmol/L (ref 3.5–5.1)
Sodium: 143 mmol/L (ref 136–145)
Total Bilirubin: 0.4 mg/dL (ref 0.0–1.0)
Total Protein: 7.5 g/dL (ref 6.4–8.2)

## 2020-05-15 LAB — LIPID PANEL
Cholesterol, Total: 124 mg/dL (ref 0–199)
HDL: 51 mg/dL (ref 40–60)
LDL Calculated: 56 mg/dL (ref <100)
Triglycerides: 85 mg/dL (ref 0–150)
VLDL Cholesterol Calculated: 17 mg/dL

## 2020-05-15 LAB — TSH WITH REFLEX: TSH: 2.41 u[IU]/mL (ref 0.27–4.20)

## 2020-05-16 LAB — HEMOGLOBIN A1C
Hemoglobin A1C: 6 %
eAG: 125.5 mg/dL

## 2020-05-23 ENCOUNTER — Ambulatory Visit
Admit: 2020-05-23 | Discharge: 2020-05-23 | Payer: BLUE CROSS/BLUE SHIELD | Attending: Internal Medicine | Primary: Internal Medicine

## 2020-05-23 DIAGNOSIS — Z0000 Encounter for general adult medical examination without abnormal findings: Secondary | ICD-10-CM

## 2020-05-23 MED ORDER — SIMVASTATIN 40 MG PO TABS
40 MG | ORAL_TABLET | ORAL | 1 refills | Status: DC
Start: 2020-05-23 — End: 2020-08-18

## 2020-06-07 MED ORDER — AMLODIPINE BESYLATE 5 MG PO TABS
5 MG | ORAL_TABLET | ORAL | 0 refills | Status: DC
Start: 2020-06-07 — End: 2020-08-18

## 2020-08-17 ENCOUNTER — Encounter

## 2020-08-17 NOTE — Telephone Encounter (Signed)
Pt needs a refill on metoprolol succinate (TOPROL XL) 50 MG extended release tablet   simvastatin (ZOCOR) 40 MG tablet   amLODIPine (NORVASC) 5 MG tablet       [facility] [address], [place] - [place]   [place], [place] [address]   Phone:  [phone] ??Fax:  [phone]      Please advise

## 2020-08-18 MED ORDER — AMLODIPINE BESYLATE 5 MG PO TABS
5 MG | ORAL_TABLET | ORAL | 1 refills | Status: DC
Start: 2020-08-18 — End: 2021-03-19

## 2020-08-18 MED ORDER — METOPROLOL SUCCINATE ER 50 MG PO TB24
50 MG | ORAL_TABLET | ORAL | 1 refills | Status: DC
Start: 2020-08-18 — End: 2020-11-21

## 2020-08-18 MED ORDER — SIMVASTATIN 40 MG PO TABS
40 MG | ORAL_TABLET | ORAL | 1 refills | Status: DC
Start: 2020-08-18 — End: 2021-01-30

## 2020-08-18 NOTE — Telephone Encounter (Signed)
rx sent

## 2020-08-21 MED ORDER — ALLOPURINOL 300 MG PO TABS
300 MG | ORAL_TABLET | Freq: Every day | ORAL | 1 refills | Status: DC
Start: 2020-08-21 — End: 2020-11-21

## 2020-09-28 MED ORDER — HYDRALAZINE HCL 25 MG PO TABS
25 MG | ORAL_TABLET | ORAL | 1 refills | Status: DC
Start: 2020-09-28 — End: 2021-03-19

## 2020-11-07 ENCOUNTER — Encounter

## 2020-11-08 LAB — HEMOGLOBIN A1C
Hemoglobin A1C: 6 %
eAG: 125.5 mg/dL

## 2020-11-08 LAB — CBC WITH AUTO DIFFERENTIAL
Basophils %: 0.9 %
Basophils Absolute: 0 10*3/uL (ref 0.0–0.2)
Eosinophils %: 7.4 %
Eosinophils Absolute: 0.4 10*3/uL (ref 0.0–0.6)
Hematocrit: 41.1 % (ref 40.5–52.5)
Hemoglobin: 13.8 g/dL (ref 13.5–17.5)
Lymphocytes %: 39.5 %
Lymphocytes Absolute: 2 10*3/uL (ref 1.0–5.1)
MCH: 33.8 pg (ref 26.0–34.0)
MCHC: 33.5 g/dL (ref 31.0–36.0)
MCV: 101 fL — ABNORMAL HIGH (ref 80.0–100.0)
MPV: 8.4 fL (ref 5.0–10.5)
Monocytes %: 10.4 %
Monocytes Absolute: 0.5 10*3/uL (ref 0.0–1.3)
Neutrophils %: 41.8 %
Neutrophils Absolute: 2.1 10*3/uL (ref 1.7–7.7)
Platelets: 161 10*3/uL (ref 135–450)
RBC: 4.07 M/uL — ABNORMAL LOW (ref 4.20–5.90)
RDW: 13.7 % (ref 12.4–15.4)
WBC: 5.1 10*3/uL (ref 4.0–11.0)

## 2020-11-08 LAB — TSH WITH REFLEX: TSH: 1.9 u[IU]/mL (ref 0.27–4.20)

## 2020-11-08 LAB — COMPREHENSIVE METABOLIC PANEL
ALT: 23 U/L (ref 10–40)
AST: 54 U/L — ABNORMAL HIGH (ref 15–37)
Albumin/Globulin Ratio: 1.1 (ref 1.1–2.2)
Albumin: 4.2 g/dL (ref 3.4–5.0)
Alkaline Phosphatase: 114 U/L (ref 40–129)
Anion Gap: 14 (ref 3–16)
BUN: 4 mg/dL — ABNORMAL LOW (ref 7–20)
CO2: 22 mmol/L (ref 21–32)
Calcium: 9.4 mg/dL (ref 8.3–10.6)
Chloride: 103 mmol/L (ref 99–110)
Creatinine: 0.6 mg/dL — ABNORMAL LOW (ref 0.9–1.3)
GFR African American: >60 (ref >60)
GFR Non-African American: >60 (ref >60)
Glucose: 108 mg/dL — ABNORMAL HIGH (ref 70–99)
Potassium: 4.2 mmol/L (ref 3.5–5.1)
Sodium: 139 mmol/L (ref 136–145)
Total Bilirubin: 0.5 mg/dL (ref 0.0–1.0)
Total Protein: 8 g/dL (ref 6.4–8.2)

## 2020-11-08 LAB — LIPID PANEL
Cholesterol, Total: 123 mg/dL (ref 0–199)
HDL: 43 mg/dL (ref 40–60)
LDL Calculated: 62 mg/dL (ref <100)
Triglycerides: 88 mg/dL (ref 0–150)
VLDL Cholesterol Calculated: 18 mg/dL

## 2020-11-08 LAB — PSA PROSTATIC SPECIFIC ANTIGEN: PSA: <0.01 ng/mL (ref 0.00–4.00)

## 2020-11-21 ENCOUNTER — Ambulatory Visit
Admit: 2020-11-21 | Discharge: 2020-11-21 | Payer: BLUE CROSS/BLUE SHIELD | Attending: Internal Medicine | Primary: Internal Medicine

## 2020-11-21 DIAGNOSIS — I10 Essential (primary) hypertension: Secondary | ICD-10-CM

## 2020-11-21 MED ORDER — POTASSIUM CHLORIDE CRYS ER 10 MEQ PO TBCR
10 MEQ | ORAL_TABLET | ORAL | 1 refills | Status: DC
Start: 2020-11-21 — End: 2021-08-15

## 2020-11-21 MED ORDER — ALLOPURINOL 300 MG PO TABS
300 MG | ORAL_TABLET | Freq: Every day | ORAL | 1 refills | Status: DC
Start: 2020-11-21 — End: 2021-03-19

## 2020-11-21 MED ORDER — METOPROLOL SUCCINATE ER 50 MG PO TB24
50 MG | ORAL_TABLET | ORAL | 1 refills | Status: DC
Start: 2020-11-21 — End: 2021-03-19

## 2020-11-21 NOTE — Assessment & Plan Note (Signed)
Monitored by specialist- no acute findings meriting change in the plan

## 2020-11-21 NOTE — Assessment & Plan Note (Signed)
diet controled

## 2020-11-21 NOTE — Assessment & Plan Note (Signed)
Well-controlled, continue current medications and lifestyle modifications recommended

## 2020-11-21 NOTE — Assessment & Plan Note (Signed)
Monitored by specialist- no acute findings meriting change in the plan no recent symptoms

## 2020-11-21 NOTE — Assessment & Plan Note (Signed)
Well-controlled, continue current medications and lifestyle modifications recommended Repeat labs in 6 months  With CPE

## 2020-11-21 NOTE — Assessment & Plan Note (Signed)
Remains sober as noted

## 2020-11-21 NOTE — Progress Notes (Signed)
Subjective:      Patient ID: Phillip Harper is a 60 y.o. male.    HPI  Here today for follow up of chronic problems as per HPI and as problems listed under assessment and plan,no new c/o feels good     Hypertension  This is a chronic problem. The current episode started more than 1 year ago. The problem is unchanged. The problem is controlled. Associated symptoms include anxiety. Risk factors for coronary artery disease include dyslipidemia, male gender and obesity. Past treatments include beta blockers, lifestyle changes, calcium channel blockers and diuretics. The current treatment provides significant improvement. There are no compliance problems.    Hyperlipidemia  This is a chronic problem. The current episode started more than 1 year ago. The problem is controlled. Recent lipid tests were reviewed and are low. There are no known factors aggravating his hyperlipidemia. Current antihyperlipidemic treatment includes diet change, exercise and statins. The current treatment provides significant improvement of lipids. There are no compliance problems.  Risk factors for coronary artery disease include dyslipidemia, hypertension, male sex and obesity.       Allergies   Allergen Reactions   ??? Shellfish-Derived Products Swelling     ORAL EDEMA, ORAL ITCHING       Current Outpatient Medications   Medication Sig Dispense Refill   ??? hydrALAZINE (APRESOLINE) 25 MG tablet ONE EVERY 8 HOURS 270 tablet 1   ??? allopurinol (ZYLOPRIM) 300 MG tablet Take 1 tablet by mouth daily 90 tablet 1   ??? metoprolol succinate (TOPROL XL) 50 MG extended release tablet TAKE 1 TABLET BY MOUTH EVERY NIGHT 90 tablet 1   ??? simvastatin (ZOCOR) 40 MG tablet TAKE 1 TABLET BY MOUTH AT BEDTIME 90 tablet 1   ??? amLODIPine (NORVASC) 5 MG tablet TAKE 1 TABLET BY MOUTH DAILY 90 tablet 1   ??? potassium chloride (KLOR-CON M) 10 MEQ extended release tablet TAKE 1 TABLET BY MOUTH THREE TIMES DAILY 270 tablet 1   ??? cholestyramine (QUESTRAN) 4 g packet MIX AND  DRINK 1 PACKET BY MOUTH DAILY     ??? Cyanocobalamin (B-12) 500 MCG TABS TAKE 1 TABLET BY MOUTH DAILY     ??? folic acid (FOLVITE) 1 MG tablet TAKE 1 TABLET BY MOUTH EVERY DAY     ??? budesonide (ENTOCORT EC) 3 MG extended release capsule Take 9 mg by mouth every morning      ??? Pancrelipase, Lip-Prot-Amyl, (CREON PO) Take by mouth 3 times daily (with meals)      ??? Loperamide HCl (IMODIUM A-D PO) Take 2 tablets by mouth daily as needed      ??? indomethacin (INDOCIN) 25 MG capsule Take 25 mg by mouth as needed for Pain     ??? albuterol-ipratropium (COMBIVENT RESPIMAT) 20-100 MCG/ACT AERS inhaler Inhale 1 puff into the lungs every 6 hours 1 Inhaler 0   ??? colchicine (COLCRYS) 0.6 MG tablet 1 po prn for gout attack may repeat in 2 hours no more than 2/24 hrs 10 tablet 1   ??? Vitamin D (CHOLECALCIFEROL) 1000 UNITS CAPS capsule Take 2,000 Units by mouth daily.       No current facility-administered medications for this visit.       Past Medical History:   Diagnosis Date   ??? Alcoholism in recovery [facility]) 08/01/2017   ??? Cancer [facility]) 2016    Prostate [name]   ??? Clostridium difficile infection 08/01/2010   ??? Gout    ??? HTN (hypertension)    ???  Hyperlipidemia    ??? Keloids 12/2003    multiple keloids on head   ??? Pancreatitis, alcoholic [id number]   ??? Vitamin D deficiency        Family History   Problem Relation Age of Onset   ??? Cancer Mother         lung CA   ??? Heart Disease Father         CAD   ??? Cancer Sister         lung CA       Past Surgical History:   Procedure Laterality Date   ??? COLONOSCOPY  02/04/13    Dr Fredonia Highland - normal repeat 2024   ??? COLONOSCOPY N/A 07/14/2017    COLONOSCOPY WITH BIOPSY performed by Waynard Reeds, MD at [place]   ??? LAPAROSCOPIC APPENDECTOMY N/A 09/11/2017    LAPAROSCOPIC APPENDECTOMY performed by Elly Modena, MD at [place]   ??? OTHER SURGICAL HISTORY      KELOID SURGERY   ??? PROSTATE BIOPSY     ??? PROSTATECTOMY Bilateral 12/20/14    robotic assisted laparoscopic   ??? UPPER GASTROINTESTINAL  ENDOSCOPY N/A 07/23/2017    EGD WITH ESOPHAGEAL ENDOSCOPIC ULTRASOUND performed by York Spaniel, MD at [place]   ??? UPPER GASTROINTESTINAL ENDOSCOPY N/A 07/23/2017    EGD BIOPSY performed by York Spaniel, MD at [place] History     Socioeconomic History   ??? Marital status: Legally Separated     Spouse name: Not on file   ??? Number of children: Not on file   ??? Years of education: Not on file   ??? Highest education level: Not on file   Occupational History   ??? Not on file   Tobacco Use   ??? Smoking status: Never Smoker   ??? Smokeless tobacco: Never Used   Vaping Use   ??? Vaping Use: Never used   Substance and Sexual Activity   ??? Alcohol use: No     Alcohol/week: 0.0 standard drinks     Comment: SOBER SINCE 08-01-17   ??? Drug use: No   ??? Sexual activity: Yes     Partners: Female   Other Topics Concern   ??? Not on file   Social History Narrative   ??? Not on file     Social Determinants of Health     Financial Resource Strain: Low Risk    ??? Difficulty of Paying Living Expenses: Not hard at all   Food Insecurity: No Food Insecurity   ??? Worried About Charity fundraiser in the Last Year: Never true   ??? Ran Out of Food in the Last Year: Never true   Transportation Needs:    ??? Lack of Transportation (Medical): Not on file   ??? Lack of Transportation (Non-Medical): Not on file   Physical Activity:    ??? Days of Exercise per Week: Not on file   ??? Minutes of Exercise per Session: Not on file   Stress:    ??? Feeling of Stress : Not on file   Social Connections:    ??? Frequency of Communication with Friends and Family: Not on file   ??? Frequency of Social Gatherings with Friends and Family: Not on file   ??? Attends Religious Services: Not on file   ??? Active Member of Clubs or Organizations: Not on file   ??? Attends Club or Organization Meetings: Not on file   ???  Marital Status: Not on file   Intimate Partner Violence:    ??? Fear of Current or Ex-Partner: Not on file   ??? Emotionally Abused: Not on file   ???  Physically Abused: Not on file   ??? Sexually Abused: Not on file   Housing Stability:    ??? Unable to Pay for Housing in the Last Year: Not on file   ??? Number of Places Lived in the Last Year: Not on file   ??? Unstable Housing in the Last Year: Not on file       Review of Systems  Review of Systems   Constitutional: Positive for unexpected weight change (down a few # ).   HENT: Negative.    Eyes: Negative.         Glasses   Respiratory: Negative.         Sleep study was within normal limits no  c-pap needed    Cardiovascular: Negative.    Gastrointestinal: Positive for diarrhea (occ .sx as noted ).   Endocrine: Negative.    Genitourinary: Positive for frequency and urgency.        Nocturia    Musculoskeletal: Negative.         Gout is stable since taking allopurinol    Skin: Negative.         Acne keloidas nuchae back of neck as noted    Allergic/Immunologic: Negative.    Neurological: Negative.    Hematological: Negative.    Psychiatric/Behavioral: Negative.        Objective:   Physical Exam:  Physical Exam  Constitutional:       Appearance: He is well-developed. He is obese.   HENT:      Head: Normocephalic and atraumatic.      Right Ear: External ear normal.      Left Ear: External ear normal.   Eyes:      Extraocular Movements: Extraocular movements intact.      Conjunctiva/sclera: Conjunctivae normal.      Pupils: Pupils are equal, round, and reactive to light.   Neck:      Thyroid: No thyromegaly.      Trachea: No tracheal deviation.   Cardiovascular:      Rate and Rhythm: Normal rate and regular rhythm.      Heart sounds: Normal heart sounds. No murmur heard.      Pulmonary:      Effort: Pulmonary effort is normal.      Breath sounds: Normal breath sounds.   Abdominal:      Comments: obese   Musculoskeletal:         General: Tenderness (low back ) present. Normal range of motion.      Cervical back: Normal range of motion and neck supple.   Skin:     General: Skin is warm and dry.      Comments: Acne keloidas  nuchae back of neck as noted    Neurological:      General: No focal deficit present.      Mental Status: He is alert and oriented to person, place, and time.      Deep Tendon Reflexes: Reflexes are normal and symmetric.   Psychiatric:         Mood and Affect: Mood normal.         Behavior: Behavior normal.         BP 110/70 (Site: Right Upper Arm, Position: Sitting, Cuff Size: Medium Adult)    Pulse 80  Temp 98.7 ??F (37.1 ??C) (Temporal)    Resp 12    Ht 5\' 9"  (1.753 m)    Wt 248 lb 6.4 oz (112.7 kg)    SpO2 97%    BMI 36.68 kg/m??       Assessment & Plan:         Prostate CA ([place])   Monitored by specialist- no acute findings meriting change in the plan    Gout   Well-controlled, continue current medications and lifestyle modifications recommended Repeat labs in 6 months  With CPE    Hyperlipidemia   Well-controlled, continue current medications and lifestyle modifications recommended Repeat labs in 6 months  With CPE     HTN (hypertension)   Well-controlled, continue current medications and lifestyle modifications recommended Repeat labs in 6 months  With CPE     Vitamin D deficiency   Well-controlled, continue current medications and lifestyle modifications recommended    Alcohol abuse   Remains sober as noted     Lymphocytic colitis   Monitored by specialist- no acute findings meriting change in the plan no recent symptoms     Acne keloidalis nuchae   Monitored by specialist- no acute findings meriting change in the plan    Hyperglycemia   diet controled

## 2021-01-25 ENCOUNTER — Encounter

## 2021-01-30 MED ORDER — SIMVASTATIN 40 MG PO TABS
40 MG | ORAL_TABLET | ORAL | 1 refills | Status: DC
Start: 2021-01-30 — End: 2021-08-15

## 2021-03-19 MED ORDER — AMLODIPINE BESYLATE 5 MG PO TABS
5 MG | ORAL_TABLET | ORAL | 1 refills | Status: DC
Start: 2021-03-19 — End: 2021-08-15

## 2021-03-19 MED ORDER — HYDRALAZINE HCL 25 MG PO TABS
25 MG | ORAL_TABLET | ORAL | 1 refills | Status: DC
Start: 2021-03-19 — End: 2021-08-15

## 2021-03-19 MED ORDER — METOPROLOL SUCCINATE ER 50 MG PO TB24
50 MG | ORAL_TABLET | ORAL | 1 refills | Status: DC
Start: 2021-03-19 — End: 2021-08-15

## 2021-03-19 MED ORDER — ALLOPURINOL 300 MG PO TABS
300 MG | ORAL_TABLET | Freq: Every day | ORAL | 1 refills | Status: DC
Start: 2021-03-19 — End: 2021-05-14

## 2021-05-14 ENCOUNTER — Encounter

## 2021-05-14 MED ORDER — ALLOPURINOL 300 MG PO TABS
300 MG | ORAL_TABLET | ORAL | 1 refills | Status: DC
Start: 2021-05-14 — End: 2021-08-15

## 2021-05-15 LAB — PSA PROSTATIC SPECIFIC ANTIGEN: PSA: <0.01 ng/mL (ref 0.00–4.00)

## 2021-05-15 LAB — COMPREHENSIVE METABOLIC PANEL
ALT: 26 U/L (ref 10–40)
AST: 60 U/L — ABNORMAL HIGH (ref 15–37)
Albumin/Globulin Ratio: 1 — ABNORMAL LOW (ref 1.1–2.2)
Albumin: 4 g/dL (ref 3.4–5.0)
Alkaline Phosphatase: 137 U/L — ABNORMAL HIGH (ref 40–129)
Anion Gap: 14 (ref 3–16)
BUN: 6 mg/dL — ABNORMAL LOW (ref 7–20)
CO2: 23 mmol/L (ref 21–32)
Calcium: 9.5 mg/dL (ref 8.3–10.6)
Chloride: 102 mmol/L (ref 99–110)
Creatinine: 0.6 mg/dL — ABNORMAL LOW (ref 0.9–1.3)
GFR African American: >60 (ref >60)
GFR Non-African American: >60 (ref >60)
Glucose: 108 mg/dL — ABNORMAL HIGH (ref 70–99)
Potassium: 4.8 mmol/L (ref 3.5–5.1)
Sodium: 139 mmol/L (ref 136–145)
Total Bilirubin: 0.4 mg/dL (ref 0.0–1.0)
Total Protein: 8.2 g/dL (ref 6.4–8.2)

## 2021-05-15 LAB — URINALYSIS WITH REFLEX TO CULTURE
Bilirubin Urine: NEGATIVE
Blood, Urine: NEGATIVE
Glucose, Ur: NEGATIVE mg/dL
Ketones, Urine: NEGATIVE mg/dL
Leukocyte Esterase, Urine: NEGATIVE
Nitrite, Urine: NEGATIVE
Protein, UA: NEGATIVE mg/dL
Specific Gravity, UA: 1.014 (ref 1.005–1.030)
Urobilinogen, Urine: 0.2 E.U./dL (ref <2.0)
pH, UA: 5.5 (ref 5.0–8.0)

## 2021-05-15 LAB — CBC WITH AUTO DIFFERENTIAL
Basophils %: 1.1 %
Basophils Absolute: 0.1 10*3/uL (ref 0.0–0.2)
Eosinophils %: 6.1 %
Eosinophils Absolute: 0.4 10*3/uL (ref 0.0–0.6)
Hematocrit: 40.6 % (ref 40.5–52.5)
Hemoglobin: 13.7 g/dL (ref 13.5–17.5)
Lymphocytes %: 38.7 %
Lymphocytes Absolute: 2.4 10*3/uL (ref 1.0–5.1)
MCH: 34.8 pg — ABNORMAL HIGH (ref 26.0–34.0)
MCHC: 33.8 g/dL (ref 31.0–36.0)
MCV: 103.2 fL — ABNORMAL HIGH (ref 80.0–100.0)
MPV: 8.5 fL (ref 5.0–10.5)
Monocytes %: 8.6 %
Monocytes Absolute: 0.5 10*3/uL (ref 0.0–1.3)
Neutrophils %: 45.5 %
Neutrophils Absolute: 2.8 10*3/uL (ref 1.7–7.7)
Platelets: 207 10*3/uL (ref 135–450)
RBC: 3.93 M/uL — ABNORMAL LOW (ref 4.20–5.90)
RDW: 14 % (ref 12.4–15.4)
WBC: 6.1 10*3/uL (ref 4.0–11.0)

## 2021-05-15 LAB — HEMOGLOBIN A1C
Hemoglobin A1C: 5.9 %
eAG: 122.6 mg/dL

## 2021-05-15 LAB — LIPID PANEL
Cholesterol, Total: 136 mg/dL (ref 0–199)
HDL: 43 mg/dL (ref 40–60)
LDL Calculated: 62 mg/dL (ref <100)
Triglycerides: 157 mg/dL — ABNORMAL HIGH (ref 0–150)
VLDL Cholesterol Calculated: 31 mg/dL

## 2021-05-15 LAB — VITAMIN D 25 HYDROXY: Vit D, 25-Hydroxy: 44.4 ng/mL (ref >=30)

## 2021-05-15 LAB — TSH WITH REFLEX: TSH: 2.75 u[IU]/mL (ref 0.27–4.20)

## 2021-05-21 NOTE — Telephone Encounter (Signed)
Left message on machine for patient CPE was 05/23/20 and needs to be 1 year apart per insurance unless it is in once in the calendar year, no sooner appts available.

## 2021-05-21 NOTE — Telephone Encounter (Signed)
-----   Message from [place] sent at 05/21/2021  3:23 PM EDT -----  Subject: Appointment Request    Reason for Call: Established Patient Appointment needed: Routine Physical   Exam    QUESTIONS    Reason for appointment request? Available appointments did not meet   patient need     Additional Information for Provider? PT would like an earlier apt if avilb   for 05/25/21 on his schedule date Please call PT back to reschedule an   earlier apt   ---------------------------------------------------------------------------  --------------  [place]  [phone]; OK to leave message on voicemail  ---------------------------------------------------------------------------  --------------  SCRIPT ANSWERS  COVID Screen: Nyoka Cowden

## 2021-05-25 ENCOUNTER — Ambulatory Visit
Admit: 2021-05-25 | Discharge: 2021-05-25 | Payer: BLUE CROSS/BLUE SHIELD | Attending: Internal Medicine | Primary: Internal Medicine

## 2021-05-25 DIAGNOSIS — M109 Gout, unspecified: Secondary | ICD-10-CM

## 2021-05-25 NOTE — Progress Notes (Signed)
Subjective:      Patient ID: Phillip Harper is a 60 y.o. male.    HPI  Here today for complete physical and review of chronic problems as listed under assessment and plan,no new c/o feels good       Hyperlipidemia  This is a chronic problem. The current episode started more than 1 year ago. The problem is controlled. Recent lipid tests were reviewed and are low. There are no known factors aggravating his hyperlipidemia. Current antihyperlipidemic treatment includes diet change, exercise and statins. The current treatment provides significant improvement of lipids. There are no compliance problems.  Risk factors for coronary artery disease include dyslipidemia, hypertension, male sex and obesity.   Hypertension  This is a chronic problem. The current episode started more than 1 year ago. The problem is unchanged. The problem is controlled. Associated symptoms include anxiety. Risk factors for coronary artery disease include dyslipidemia, male gender and obesity. Past treatments include beta blockers, lifestyle changes, calcium channel blockers and diuretics. The current treatment provides significant improvement. There are no compliance problems.      Allergies   Allergen Reactions    Shellfish-Derived Products Swelling     ORAL EDEMA, ORAL ITCHING       Current Outpatient Medications   Medication Sig Dispense Refill    allopurinol (ZYLOPRIM) 300 MG tablet TAKE 1 TABLET BY MOUTH DAILY 90 tablet 1    amLODIPine (NORVASC) 5 MG tablet TAKE 1 TABLET BY MOUTH DAILY 90 tablet 1    hydrALAZINE (APRESOLINE) 25 MG tablet ONE EVERY 8 HOURS 270 tablet 1    metoprolol succinate (TOPROL XL) 50 MG extended release tablet TAKE 1 TABLET BY MOUTH EVERY NIGHT 90 tablet 1    simvastatin (ZOCOR) 40 MG tablet TAKE 1 TABLET BY MOUTH AT BEDTIME 90 tablet 1    potassium chloride (KLOR-CON M) 10 MEQ extended release tablet TAKE 1 TABLET BY MOUTH THREE TIMES DAILY 270 tablet 1    cholestyramine (QUESTRAN) 4 g packet MIX AND DRINK 1 PACKET BY  MOUTH DAILY      Cyanocobalamin (B-12) 500 MCG TABS TAKE 1 TABLET BY MOUTH DAILY      folic acid (FOLVITE) 1 MG tablet TAKE 1 TABLET BY MOUTH EVERY DAY      budesonide (ENTOCORT EC) 3 MG extended release capsule Take 9 mg by mouth every morning       Loperamide HCl (IMODIUM A-D PO) Take 2 tablets by mouth daily as needed       indomethacin (INDOCIN) 25 MG capsule Take 25 mg by mouth as needed for Pain      albuterol-ipratropium (COMBIVENT RESPIMAT) 20-100 MCG/ACT AERS inhaler Inhale 1 puff into the lungs every 6 hours 1 Inhaler 0    colchicine (COLCRYS) 0.6 MG tablet 1 po prn for gout attack may repeat in 2 hours no more than 2/24 hrs 10 tablet 1    Vitamin D (CHOLECALCIFEROL) 1000 UNITS CAPS capsule Take 2,000 Units by mouth daily.       No current facility-administered medications for this visit.       Past Medical History:   Diagnosis Date    Alcoholism in recovery ([place]) 08/01/2017    Cancer ([place]) 2016    Prostate [name]    Clostridium difficile infection 08/01/2010    Gout     HTN (hypertension)     Hyperlipidemia     Keloids 12/2003    multiple keloids on head    Pancreatitis, alcoholic [id number]  Vitamin D deficiency        Family History   Problem Relation Age of Onset    Cancer Mother         lung CA    Heart Disease Father         CAD    Cancer Sister         lung CA       Past Surgical History:   Procedure Laterality Date    COLONOSCOPY  02/04/13    Dr Fredonia Highland - normal repeat 2024    COLONOSCOPY N/A 07/14/2017    COLONOSCOPY WITH BIOPSY performed by Waynard Reeds, MD at [place] N/A 09/11/2017    LAPAROSCOPIC APPENDECTOMY performed by Elly Modena, MD at [place] Bilateral 12/20/14    robotic assisted laparoscopic    UPPER GASTROINTESTINAL ENDOSCOPY N/A 07/23/2017    EGD WITH ESOPHAGEAL ENDOSCOPIC ULTRASOUND performed by York Spaniel, MD at [place] N/A 07/23/2017    EGD BIOPSY performed by York Spaniel, MD at [place] History     Socioeconomic History    Marital status: Legally Separated     Spouse name: Not on file    Number of children: Not on file    Years of education: Not on file    Highest education level: Not on file   Occupational History    Not on file   Tobacco Use    Smoking status: Never    Smokeless tobacco: Never   Vaping Use    Vaping Use: Never used   Substance and Sexual Activity    Alcohol use: No     Alcohol/week: 0.0 standard drinks     Comment: SOBER SINCE 08-01-17    Drug use: No    Sexual activity: Yes     Partners: Female   Other Topics Concern    Not on file   Social History Narrative    Not on file     Social Determinants of Health     Financial Resource Strain: Low Risk     Difficulty of Paying Living Expenses: Not hard at all   Food Insecurity: No Food Insecurity    Worried About Charity fundraiser in the Last Year: Never true    Arboriculturist in the Last Year: Never true   Transportation Needs: Not on file   Physical Activity: Not on file   Stress: Not on file   Social Connections: Not on file   Intimate Partner Violence: Not on file   Housing Stability: Not on file       Review of Systems  Review of Systems   Constitutional:  Positive for unexpected weight change (down a few # ).   HENT: Negative.     Eyes: Negative.         Glasses   Respiratory: Negative.          Sleep study was within normal limits no  c-pap needed    Cardiovascular: Negative.    Gastrointestinal: Negative.    Endocrine: Negative.    Genitourinary:  Positive for frequency and urgency.        Nocturia    Musculoskeletal: Negative.  Gout is stable since taking allopurinol    Skin: Negative.         Acne keloidas nuchae back of neck as noted    Allergic/Immunologic: Negative.    Neurological: Negative.    Hematological: Negative.    Psychiatric/Behavioral: Negative.       Objective:   Physical  Exam:  Physical Exam  Constitutional:       Appearance: He is well-developed. He is obese.   HENT:      Head: Normocephalic and atraumatic.      Right Ear: Tympanic membrane, ear canal and external ear normal.      Left Ear: Tympanic membrane, ear canal and external ear normal.   Eyes:      Extraocular Movements: Extraocular movements intact.      Conjunctiva/sclera: Conjunctivae normal.      Pupils: Pupils are equal, round, and reactive to light.   Neck:      Thyroid: No thyromegaly.      Trachea: No tracheal deviation.   Cardiovascular:      Rate and Rhythm: Normal rate and regular rhythm.      Heart sounds: Normal heart sounds. No murmur heard.  Pulmonary:      Effort: Pulmonary effort is normal.      Breath sounds: Normal breath sounds.   Abdominal:      General: Abdomen is flat. Bowel sounds are normal.      Palpations: Abdomen is soft.      Comments: obese   Genitourinary:     Comments: Per [name]  Musculoskeletal:         General: Tenderness (low back ) present. Normal range of motion.      Cervical back: Normal range of motion and neck supple.   Skin:     General: Skin is warm and dry.      Comments: Acne keloidas nuchae back of neck as noted    Neurological:      Mental Status: He is alert and oriented to person, place, and time.      Deep Tendon Reflexes: Reflexes are normal and symmetric.   Psychiatric:         Behavior: Behavior normal.       BP 110/64 (Site: Right Upper Arm, Position: Sitting, Cuff Size: Medium Adult)    Pulse 86    Temp 98 ??F (36.7 ??C) (Temporal)    Resp 12    Ht 5\' 9"  (1.753 m)    Wt 248 lb (112.5 kg)    BMI 36.62 kg/m??       Assessment & Plan:         Gout   No recent symptoms cont meds and diet     Hyperlipidemia   Well-controlled, continue current medications and lifestyle modifications recommended    HTN (hypertension)   Well-controlled, continue current medications and lifestyle modifications recommended    Vitamin D deficiency   Continue current meds     Alcohol abuse    Remains sober as noted     Lymphocytic colitis   Monitored by specialist- no acute findings meriting change in the plan  No recent symptoms     Prostate CA ([place])   Monitored by specialist- no acute findings meriting change in the plan last PSA <0.01    Hyperglycemia  Diet controled     Abnormal LFTs   Last labs all ok     Well adult exam    Within normal limits for age- cont to work  no ADL issues,immunizations up to date, no depression ,no cognitive impairment  Colonoscopy up to date  Eye exam up to date  Exercises as tolerated    No living will but does not want resuscitation   Findings and recommendations discussed with Pt

## 2021-05-25 NOTE — Assessment & Plan Note (Signed)
Well-controlled, continue current medications and lifestyle modifications recommended

## 2021-05-25 NOTE — Assessment & Plan Note (Signed)
Monitored by specialist- no acute findings meriting change in the plan last PSA <0.01

## 2021-05-25 NOTE — Assessment & Plan Note (Signed)
Continue current meds

## 2021-05-25 NOTE — Assessment & Plan Note (Signed)
Within normal limits for age- cont to work no ADL issues,immunizations up to date, no depression ,no cognitive impairment  Colonoscopy up to date  Eye exam up to date  Exercises as tolerated    No living will but does not want resuscitation   Findings and recommendations discussed with Pt

## 2021-05-25 NOTE — Assessment & Plan Note (Signed)
Diet controled

## 2021-05-25 NOTE — Assessment & Plan Note (Signed)
No recent symptoms cont meds and diet

## 2021-05-25 NOTE — Assessment & Plan Note (Signed)
Monitored by specialist- no acute findings meriting change in the plan  No recent symptoms

## 2021-05-25 NOTE — Assessment & Plan Note (Signed)
Last labs all ok

## 2021-05-25 NOTE — Assessment & Plan Note (Signed)
Remains sober as noted

## 2021-08-15 ENCOUNTER — Encounter

## 2021-08-16 MED ORDER — AMLODIPINE BESYLATE 5 MG PO TABS
5 MG | ORAL_TABLET | ORAL | 1 refills | Status: DC
Start: 2021-08-16 — End: 2021-12-06

## 2021-08-16 MED ORDER — METOPROLOL SUCCINATE ER 50 MG PO TB24
50 MG | ORAL_TABLET | ORAL | 1 refills | Status: DC
Start: 2021-08-16 — End: 2021-12-06

## 2021-08-16 MED ORDER — ALLOPURINOL 300 MG PO TABS
300 MG | ORAL_TABLET | Freq: Every day | ORAL | 1 refills | Status: DC
Start: 2021-08-16 — End: 2021-12-06

## 2021-08-16 MED ORDER — SIMVASTATIN 40 MG PO TABS
40 MG | ORAL_TABLET | ORAL | 1 refills | Status: DC
Start: 2021-08-16 — End: 2021-12-06

## 2021-08-16 MED ORDER — HYDRALAZINE HCL 25 MG PO TABS
25 MG | ORAL_TABLET | ORAL | 1 refills | Status: DC
Start: 2021-08-16 — End: 2021-12-06

## 2021-08-16 MED ORDER — POTASSIUM CHLORIDE CRYS ER 10 MEQ PO TBCR
10 MEQ | ORAL_TABLET | ORAL | 1 refills | Status: DC
Start: 2021-08-16 — End: 2021-12-06

## 2021-11-26 ENCOUNTER — Encounter: Payer: BLUE CROSS/BLUE SHIELD | Attending: Internal Medicine | Primary: Internal Medicine

## 2021-12-06 ENCOUNTER — Ambulatory Visit
Admit: 2021-12-06 | Discharge: 2021-12-06 | Payer: BLUE CROSS/BLUE SHIELD | Attending: Internal Medicine | Primary: Internal Medicine

## 2021-12-06 DIAGNOSIS — E782 Mixed hyperlipidemia: Secondary | ICD-10-CM

## 2021-12-06 MED ORDER — AMLODIPINE BESYLATE 5 MG PO TABS
5 MG | ORAL_TABLET | ORAL | 1 refills | Status: DC
Start: 2021-12-06 — End: 2022-06-10

## 2021-12-06 MED ORDER — HYDRALAZINE HCL 25 MG PO TABS
25 MG | ORAL_TABLET | ORAL | 1 refills | Status: AC
Start: 2021-12-06 — End: ?

## 2021-12-06 MED ORDER — METOPROLOL SUCCINATE ER 50 MG PO TB24
50 MG | ORAL_TABLET | ORAL | 1 refills | Status: DC
Start: 2021-12-06 — End: 2022-06-10

## 2021-12-06 MED ORDER — POTASSIUM CHLORIDE CRYS ER 10 MEQ PO TBCR
10 MEQ | ORAL_TABLET | ORAL | 1 refills | Status: AC
Start: 2021-12-06 — End: 2022-12-09

## 2021-12-06 MED ORDER — SIMVASTATIN 40 MG PO TABS
40 MG | ORAL_TABLET | ORAL | 1 refills | Status: DC
Start: 2021-12-06 — End: 2022-06-10

## 2021-12-06 MED ORDER — COLCHICINE 0.6 MG PO TABS
0.6 MG | ORAL_TABLET | ORAL | 1 refills | Status: DC
Start: 2021-12-06 — End: 2023-06-12

## 2021-12-06 MED ORDER — ALLOPURINOL 300 MG PO TABS
300 MG | ORAL_TABLET | Freq: Every day | ORAL | 1 refills | Status: AC
Start: 2021-12-06 — End: 2022-06-04

## 2021-12-06 NOTE — Progress Notes (Signed)
[facility]  Establish care visit   12/06/2021    Phillip Harper (DOB:  Dec 12, 1960) is a 61 y.o. male, here to establish care.    Chief Complaint   Patient presents with    New Patient     Former Dr. Jones Skene patient        Patient Active Problem List   Diagnosis    Gout    Hyperlipidemia    HTN (hypertension)    Vitamin D deficiency    Alcohol abuse    Lymphocytic colitis    Prostate CA ([place])    Acne keloidalis nuchae    Hyperglycemia    Abnormal LFTs    Acute pain of left knee    Severe obesity (BMI 35.0-39.9) with comorbidity ([place])       HPI  Hyperlipidemia  This is a chronic problem. The current episode started more than 1 year ago. The problem is controlled. Recent lipid tests were reviewed and are low. There are no known factors aggravating his hyperlipidemia. Current antihyperlipidemic treatment includes diet change, exercise and statins. The current treatment provides significant improvement of lipids. There are no compliance problems.  Risk factors for coronary artery disease include dyslipidemia, hypertension, male sex and obesity.   Hypertension  This is a chronic problem. The current episode started more than 1 year ago. The problem is unchanged. The problem is controlled. Associated symptoms include anxiety. Risk factors for coronary artery disease include dyslipidemia, male gender and obesity. Past treatments include beta blockers, lifestyle changes, calcium channel blockers and diuretics. The current treatment provides significant improvement. There are no compliance problems.   Gout  This is a chronic problem. The current episode started more than 1 year ago. The problem is controlled.  Current therapies include lifestyle modification as well as allopurinol 300 mg p.o. daily.  Has colchicine to use as needed for attacks, but has not had to use this recently.  Vitamin D deficiency  This is a chronic problem. The current episode started more than 1 year ago. The problem is controlled.   Current therapies include lifestyle modification as well as vitamin D supplementation..    Prostate cancer  Patient had Gleason 3+4= 7 disease and is status post robotic prostatectomy by Dr. Marlene Lard 12/2014.  PSA has been undetectable since that time.  Lymphocytic colitis  This was diagnosed initially in 2018.  He has had colonoscopies in 07/07/19/2018, 08/2017, and 01/2020.  He was seen by Clide Cliff, MD.  His last colonoscopy was done by [facility].  He was on Entocort, but weaned off of this.  Currently his symptoms are well controlled.      ROS  Review of Systems   Constitutional:  Negative for activity change, appetite change, chills, diaphoresis, fatigue, fever and unexpected weight change.   HENT:  Negative for sinus pressure, sinus pain, sore throat, trouble swallowing and voice change.    Eyes:  Negative for visual disturbance.   Respiratory:  Negative for cough, chest tightness, shortness of breath and wheezing.    Cardiovascular:  Negative for chest pain, palpitations and leg swelling.   Gastrointestinal:  Negative for abdominal distention, abdominal pain, blood in stool, constipation, diarrhea, nausea and vomiting.   Endocrine: Negative for polydipsia and polyphagia.   Genitourinary:  Negative for decreased urine volume, difficulty urinating, dysuria and urgency.   Musculoskeletal:  Negative for back pain, gait problem, joint swelling and myalgias.   Neurological:  Negative for dizziness, seizures, syncope, light-headedness and headaches.  Psychiatric/Behavioral:  Negative for agitation, behavioral problems, confusion and suicidal ideas.       HISTORIES  Current Outpatient Medications on File Prior to Visit   Medication Sig Dispense Refill    budesonide (ENTOCORT EC) 3 MG extended release capsule Take 3 capsules by mouth every morning      Loperamide HCl (IMODIUM A-D PO) Take 2 tablets by mouth daily as needed       Vitamin D (CHOLECALCIFEROL) 1000 UNITS CAPS capsule Take 2 capsules by mouth daily       No  current facility-administered medications on file prior to visit.        Allergies   Allergen Reactions    Shellfish-Derived Products Swelling     ORAL EDEMA, ORAL ITCHING       Past Medical History:   Diagnosis Date    Alcoholism in recovery ([place]) 08/01/2017    Cancer ([place]) 2016    Prostate [name]    Clostridium difficile infection 08/01/2010    Gout     HTN (hypertension)     Hyperlipidemia     Keloids 12/2003    multiple keloids on head    Pancreatitis, alcoholic 16/10/[id number]    Vitamin D deficiency        Patient Active Problem List   Diagnosis    Gout    Hyperlipidemia    HTN (hypertension)    Vitamin D deficiency    Alcohol abuse    Lymphocytic colitis    Prostate CA (H[facility])    Acne keloidalis nuchae    Hyperglycemia    Abnormal LFTs    Acute pain of left knee    Severe obesity (BMI 35.0-39.9) with comorbidity [facility])       Past Surgical History:   Procedure Laterality Date    COLONOSCOPY  02/04/13    Dr Fredonia Highland - normal repeat 2024    COLONOSCOPY N/A 07/14/2017    COLONOSCOPY WITH BIOPSY performed by Waynard Reeds, MD at [place] N/A 09/11/2017    LAPAROSCOPIC APPENDECTOMY performed by Elly Modena, MD at [place] Bilateral 12/20/14    robotic assisted laparoscopic    UPPER GASTROINTESTINAL ENDOSCOPY N/A 07/23/2017    EGD WITH ESOPHAGEAL ENDOSCOPIC ULTRASOUND performed by York Spaniel, MD at [place] N/A 07/23/2017    EGD BIOPSY performed by York Spaniel, MD at [place] History     Socioeconomic History    Marital status: Legally Separated     Spouse name: Not on file    Number of children: Not on file    Years of education: Not on file    Highest education level: Not on file   Occupational History    Not on file   Tobacco Use    Smoking status: Never     Passive exposure: Never    Smokeless tobacco: Never    Vaping Use    Vaping Use: Never used   Substance and Sexual Activity    Alcohol use: No     Alcohol/week: 0.0 standard drinks     Comment: SOBER SINCE 08-01-17    Drug use: No    Sexual activity: Yes     Partners: Female   Other Topics Concern    Not  on file   Social History Narrative    Not on file     Social Determinants of Health     Financial Resource Strain: Low Risk     Difficulty of Paying Living Expenses: Not hard at all   Food Insecurity: No Food Insecurity    Worried About Charity fundraiser in the Last Year: Never true    Arboriculturist in the Last Year: Never true   Transportation Needs: Unknown    Lack of Transportation (Medical): Not on file    Lack of Transportation (Non-Medical): No   Physical Activity: Not on file   Stress: Not on file   Social Connections: Not on file   Intimate Partner Violence: Not on file   Housing Stability: Unknown    Unable to Pay for Housing in the Last Year: Not on file    Number of Places Lived in the Last Year: Not on file    Unstable Housing in the Last Year: No        Family History   Problem Relation Age of Onset    Cancer Mother         lung CA    Heart Disease Father         CAD    Cancer Sister         lung CA       PE  Vitals:    12/06/21 1615   BP: 138/82   Site: Right Upper Arm   Position: Sitting   Pulse: 80   Temp: 98 F (36.7 C)   SpO2: 96%   Weight: 253 lb (114.8 kg)   Height: '5\' 9"'$  (1.753 m)     Estimated body mass index is 37.36 kg/m as calculated from the following:    Height as of this encounter: '5\' 9"'$  (1.753 m).    Weight as of this encounter: 253 lb (114.8 kg).    Physical Exam  Vitals reviewed.   Constitutional:       General: He is not in acute distress.     Appearance: Normal appearance.   HENT:      Head: Normocephalic and atraumatic.      Mouth/Throat:      Pharynx: Oropharynx is clear.   Eyes:      Conjunctiva/sclera: Conjunctivae normal.      Pupils: Pupils are equal, round, and reactive to light.   Cardiovascular:      Rate and Rhythm:  Normal rate and regular rhythm.      Pulses: Normal pulses.      Heart sounds: Normal heart sounds.   Pulmonary:      Effort: Pulmonary effort is normal. No respiratory distress.      Breath sounds: Normal breath sounds. No wheezing or rales.   Abdominal:      Palpations: Abdomen is soft.      Tenderness: There is no abdominal tenderness. There is no rebound.   Musculoskeletal:         General: No signs of injury. Normal range of motion.      Cervical back: Normal range of motion and neck supple.   Skin:     General: Skin is warm and dry.      Coloration: Skin is not jaundiced.   Neurological:      General: No focal deficit present.      Mental Status: He is alert and oriented to person, place, and time.   Psychiatric:  Behavior: Behavior normal.         Thought Content: Thought content normal.         Judgment: Judgment normal.       Immunization History   Administered Date(s) Administered    COVID-19, PFIZER Bivalent BOOSTER, DO NOT Dilute, (age 12y+), IM, 30 mcg/0.3 mL 05/26/2021    COVID-19, PFIZER GRAY top, DO NOT Dilute, (age 25 y+), IM, 30 mcg/0.3 mL 11/18/2020    COVID-19, PFIZER PURPLE top, DILUTE for use, (age 279 y+), 80mg/0.3mL 10/26/2019, 11/16/2019    Influenza 06/12/2010    Influenza, AFLURIA (age 2748yrs+), FLUZONE, (age 2928mo+), MDV, 0.550m10/16/2020    Influenza, FLUARIX, FLULAVAL, FLUZONE (age 29 67o+) AND AFLURIA, (age 27 62+), PF, 0.80m59m9/23/2016, 04/17/2016, 03/27/2018    Influenza, FLUBLOK, (age 716 35), PF, 0.80mL47m/22/2018    Influenza, FLUCELVAX, (age 29 mo49), MDCK, PF, 0.80mL 54m19/2021    Influenza, Intradermal, Preservative free 05/08/2011, 05/15/2012, 05/19/2013, 06/01/2014    TDaP, ADACEL (age 10y-635y-64yOSTRIX (age 10y+), IM, 0.80mL 1180m8/2011       Health Maintenance   Topic Date Due    HIV screen  Never done    Shingles vaccine (1 of 2) Never done    DTaP/Tdap/Td vaccine (2 - Td or Tdap) 06/12/2020    Flu vaccine (Season Ended) 03/05/2022    A1C test (Diabetic or Prediabetic)   05/14/2022    Lipids  05/14/2022    Prostate Specific Antigen (PSA) Screening or Monitoring  05/14/2022    Depression Screen  05/25/2022    Colorectal Cancer Screen  07/15/2027    COVID-19 Vaccine  Completed    Hepatitis C screen  Completed    Hepatitis A vaccine  Aged Out    Hib vaccine  Aged Out    Meningococcal (ACWY) vaccine  Aged Out    Pneumococcal 0-64 years Vaccine  Aged Out    Diabetes screen  Discontinued       PSH, PMH, SH and FH reviewed and noted.  Recent and past labs, tests and consults also reviewed.  Recent or new meds also reviewed.      ASSESSMENT/ PLAN:  1. Mixed hyperlipidemia  -Reasonably well-controlled on simvastatin by the 05/14/2021 labs.  - Comprehensive Metabolic Panel; Future  - Lipid, Fasting; Future    2. Severe obesity (BMI 35.0-39.9) with comorbidity (HCC)  Reynoldsunseled weight loss.    3. Primary hypertension  -Well-controlled with amlodipine 5, hydralazine 25 3 times daily, metoprolol XL 50 mg p.o. daily.  - CBC; Future  - Comprehensive Metabolic Panel; Future    4. Gout of left foot, unspecified cause, unspecified chronicity  -Controlled on allopurinol and as needed colchicine.    5. Vitamin D deficiency  -Well-controlled on vitamin D 1000 international units daily.  - Vitamin D 25 Hydroxy; Future    6. Prostate CA (HCC)  Deckeratus post prostatectomy with undetectable PSA.  - PSA, Prostatic Specific Antigen; Future    7. Lymphocytic colitis  -Well-controlled off steroids currently.    8. Hyperglycemia  -Reassessing.  - Hemoglobin A1C; Future       Orders Placed This Encounter   Procedures    CBC    Comprehensive Metabolic Panel    Lipid, Fasting    Hemoglobin A1C    PSA, Prostatic Specific Antigen    Vitamin D 25 Hydroxy     Orders Placed This Encounter   Medications    allopurinol (ZYLOPRIM) 300 MG tablet     Sig: Take 1 tablet  by mouth daily TAKE 1 TABLET BY MOUTH DAILY     Dispense:  90 tablet     Refill:  1    amLODIPine (NORVASC) 5 MG tablet     Sig: TAKE 1 TABLET BY MOUTH DAILY      Dispense:  90 tablet     Refill:  1    colchicine (COLCRYS) 0.6 MG tablet     Sig: 1 po prn for gout attack may repeat in 2 hours no more than 2/24 hrs     Dispense:  10 tablet     Refill:  1    hydrALAZINE (APRESOLINE) 25 MG tablet     Sig: ONE EVERY 8 HOURS     Dispense:  270 tablet     Refill:  1    metoprolol succinate (TOPROL XL) 50 MG extended release tablet     Sig: TAKE 1 TABLET BY MOUTH EVERY NIGHT     Dispense:  90 tablet     Refill:  1    potassium chloride (KLOR-CON M) 10 MEQ extended release tablet     Sig: TAKE 1 TABLET BY MOUTH THREE TIMES DAILY     Dispense:  270 tablet     Refill:  1    simvastatin (ZOCOR) 40 MG tablet     Sig: TAKE 1 TABLET BY MOUTH AT BEDTIME     Dispense:  90 tablet     Refill:  1      Medications Discontinued During This Encounter   Medication Reason    albuterol-ipratropium (COMBIVENT RESPIMAT) 20-100 MCG/ACT AERS inhaler Therapy completed    cholestyramine (QUESTRAN) 4 g packet Therapy completed    indomethacin (INDOCIN) 25 MG capsule ERROR    folic acid (FOLVITE) 1 MG tablet ERROR    Cyanocobalamin (B-12) 500 MCG TABS ERROR    colchicine (COLCRYS) 0.6 MG tablet REORDER    potassium chloride (KLOR-CON M) 10 MEQ extended release tablet REORDER    simvastatin (ZOCOR) 40 MG tablet REORDER    amLODIPine (NORVASC) 5 MG tablet REORDER    hydrALAZINE (APRESOLINE) 25 MG tablet REORDER    metoprolol succinate (TOPROL XL) 50 MG extended release tablet REORDER    allopurinol (ZYLOPRIM) 300 MG tablet REORDER        Return in about 6 months (around 06/08/2022) for Chronic Conditions.    Eddie North, MD    This dictation was generated by voice recognition computer software.  Although all attempts are made to edit the dictation for accuracy, there may be errors in the transcription that are not intended.

## 2022-05-13 ENCOUNTER — Encounter

## 2022-05-14 LAB — COMPREHENSIVE METABOLIC PANEL
ALT: 26 U/L (ref 10–40)
AST: 94 U/L — ABNORMAL HIGH (ref 15–37)
Albumin/Globulin Ratio: 0.9 — ABNORMAL LOW (ref 1.1–2.2)
Albumin: 4.2 g/dL (ref 3.4–5.0)
Alkaline Phosphatase: 134 U/L — ABNORMAL HIGH (ref 40–129)
Anion Gap: 15 (ref 3–16)
BUN: 3 mg/dL — ABNORMAL LOW (ref 7–20)
CO2: 19 mmol/L — ABNORMAL LOW (ref 21–32)
Calcium: 8.5 mg/dL (ref 8.3–10.6)
Chloride: 101 mmol/L (ref 99–110)
Creatinine: 0.6 mg/dL — ABNORMAL LOW (ref 0.9–1.3)
Est, Glom Filt Rate: >60 (ref >60)
Glucose: 120 mg/dL — ABNORMAL HIGH (ref 70–99)
Potassium: 3.9 mmol/L (ref 3.5–5.1)
Sodium: 135 mmol/L — ABNORMAL LOW (ref 136–145)
Total Bilirubin: 0.6 mg/dL (ref 0.0–1.0)
Total Protein: 8.7 g/dL — ABNORMAL HIGH (ref 6.4–8.2)

## 2022-05-14 LAB — CBC
Hematocrit: 42 % (ref 40.5–52.5)
Hemoglobin: 14.2 g/dL (ref 13.5–17.5)
MCH: 35.5 pg — ABNORMAL HIGH (ref 26.0–34.0)
MCHC: 33.9 g/dL (ref 31.0–36.0)
MCV: 104.7 fL — ABNORMAL HIGH (ref 80.0–100.0)
MPV: 8.9 fL (ref 5.0–10.5)
Platelets: 186 10*3/uL (ref 135–450)
RBC: 4.01 M/uL — ABNORMAL LOW (ref 4.20–5.90)
RDW: 13.6 % (ref 12.4–15.4)
WBC: 8 10*3/uL (ref 4.0–11.0)

## 2022-05-14 LAB — LIPID, FASTING
Cholesterol, Fasting: 111 mg/dL (ref 0–199)
HDL: 35 mg/dL — ABNORMAL LOW (ref 40–60)
LDL Calculated: 57 mg/dL (ref <100)
Triglyceride, Fasting: 95 mg/dL (ref 0–150)
VLDL Cholesterol Calculated: 19 mg/dL

## 2022-05-14 LAB — HEMOGLOBIN A1C
Hemoglobin A1C: 6.1 %
eAG: 128.4 mg/dL

## 2022-05-14 LAB — PSA PROSTATIC SPECIFIC ANTIGEN: PSA: <0.01 ng/mL (ref 0.00–4.00)

## 2022-05-14 LAB — VITAMIN D 25 HYDROXY: Vit D, 25-Hydroxy: 65.3 ng/mL (ref >=30)

## 2022-05-14 MED ORDER — HYDRALAZINE HCL 25 MG PO TABS
25 MG | ORAL_TABLET | ORAL | 1 refills | Status: DC
Start: 2022-05-14 — End: 2022-12-09

## 2022-05-14 NOTE — Other (Signed)
Multiple abnormalities on the labs.  It is difficult to say how significant these are.  I think I might want him to see a liver specialist.  We will discuss more fully at the upcoming visit on 06/10/2022.

## 2022-06-10 ENCOUNTER — Ambulatory Visit
Admit: 2022-06-10 | Discharge: 2022-06-10 | Payer: BLUE CROSS/BLUE SHIELD | Attending: Internal Medicine | Primary: Internal Medicine

## 2022-06-10 DIAGNOSIS — Z0000 Encounter for general adult medical examination without abnormal findings: Secondary | ICD-10-CM

## 2022-06-10 MED ORDER — SIMVASTATIN 40 MG PO TABS
40 MG | ORAL_TABLET | ORAL | 1 refills | Status: DC
Start: 2022-06-10 — End: 2022-12-09

## 2022-06-10 MED ORDER — AMLODIPINE BESYLATE 5 MG PO TABS
5 MG | ORAL_TABLET | ORAL | 1 refills | Status: DC
Start: 2022-06-10 — End: 2022-12-09

## 2022-06-10 MED ORDER — ALLOPURINOL 300 MG PO TABS
300 MG | ORAL_TABLET | Freq: Every day | ORAL | 1 refills | Status: AC
Start: 2022-06-10 — End: 2022-12-09

## 2022-06-10 MED ORDER — METOPROLOL SUCCINATE ER 50 MG PO TB24
50 MG | ORAL_TABLET | ORAL | 1 refills | Status: DC
Start: 2022-06-10 — End: 2022-12-09

## 2022-06-10 NOTE — Progress Notes (Signed)
Well Adult Note  Name: Phillip Harper'X Date: 06/10/2022   MRN: [id number] Sex: Male   Age: 61 y.o. Ethnicity: Non-Hispanic / Non Latino   DOB: [birth year] Race: Black / African American      Phillip Harper is here for well adult exam.  History:    Hyperlipidemia  This is a chronic problem. The current episode started more than 1 year ago. The problem is controlled. Recent lipid tests were reviewed and are low. There are no known factors aggravating his hyperlipidemia. Current antihyperlipidemic treatment includes diet change, exercise and statins. The current treatment provides significant improvement of lipids. There are no compliance problems.  Risk factors for coronary artery disease include dyslipidemia, hypertension, male sex and obesity.   Hypertension  This is a chronic problem. The current episode started more than 1 year ago. The problem is unchanged. The problem is controlled. Associated symptoms include anxiety. Risk factors for coronary artery disease include dyslipidemia, male gender and obesity. Past treatments include beta blockers, lifestyle changes, calcium channel blockers and diuretics. The current treatment provides significant improvement. There are no compliance problems.   Gout  This is a chronic problem. The current episode started more than 1 year ago. The problem is controlled.  Current therapies include lifestyle modification as well as allopurinol 300 mg p.o. daily.  Has colchicine to use as needed for attacks, but has not had to use this recently.  Vitamin D deficiency  This is a chronic problem. The current episode started more than 1 year ago. The problem is controlled.  Current therapies include lifestyle modification as well as vitamin D supplementation..    Prostate cancer  Patient had Gleason 3+4= 7 disease and is status post robotic prostatectomy by Dr. Marlene Lard 12/2014.  PSA has been undetectable since that time.  Lymphocytic colitis  This was diagnosed initially in 2018.  He  has had colonoscopies in 07/07/19/2018, 08/2017, and 01/2020.  He was seen by Phillip Cliff, MD.  His last colonoscopy was done by [facility].  He was on Entocort, but weaned off of this.  Currently his symptoms are well controlled.    Review of Systems   Constitutional:  Negative for activity change, appetite change, chills, diaphoresis, fatigue, fever and unexpected weight change.   HENT:  Negative for sinus pressure, sinus pain, sore throat, trouble swallowing and voice change.    Eyes:  Negative for visual disturbance.   Respiratory:  Negative for cough, chest tightness, shortness of breath and wheezing.    Cardiovascular:  Negative for chest pain, palpitations and leg swelling.   Gastrointestinal:  Negative for abdominal distention, abdominal pain, blood in stool, constipation, diarrhea, nausea and vomiting.   Endocrine: Negative for polydipsia and polyphagia.   Genitourinary:  Negative for decreased urine volume, difficulty urinating, dysuria and urgency.   Musculoskeletal:  Negative for back pain, gait problem, joint swelling and myalgias.   Neurological:  Negative for dizziness, seizures, syncope, light-headedness and headaches.   Psychiatric/Behavioral:  Negative for agitation, behavioral problems, confusion and suicidal ideas.        Allergies   Allergen Reactions    Shellfish-Derived Products Swelling     ORAL EDEMA, ORAL ITCHING         Prior to Visit Medications    Medication Sig Taking? Authorizing Provider   metoprolol succinate (TOPROL XL) 50 MG extended release tablet TAKE 1 TABLET BY MOUTH EVERY NIGHT Yes Eddie North, MD   simvastatin (ZOCOR) 40 MG tablet TAKE 1 TABLET BY  MOUTH AT BEDTIME Yes Eddie North, MD   allopurinol (ZYLOPRIM) 300 MG tablet Take 1 tablet by mouth daily TAKE 1 TABLET BY MOUTH DAILY Yes Eddie North, MD   amLODIPine (NORVASC) 5 MG tablet TAKE 1 TABLET BY MOUTH DAILY Yes Eddie North, MD   hydrALAZINE (APRESOLINE) 25 MG tablet TAKE 1 TABLET BY MOUTH EVERY 8 HOURS Yes  Eddie North, MD   colchicine (COLCRYS) 0.6 MG tablet 1 po prn for gout attack may repeat in 2 hours no more than 2/24 hrs Yes Eddie North, MD   potassium chloride (KLOR-CON M) 10 MEQ extended release tablet TAKE 1 TABLET BY MOUTH THREE TIMES DAILY Yes Eddie North, MD   Loperamide HCl (IMODIUM A-D PO) Take 2 tablets by mouth daily as needed  Yes [provider]   Vitamin D (CHOLECALCIFEROL) 1000 UNITS CAPS capsule Take 2 capsules by mouth daily Yes [provider]         Past Medical History:   Diagnosis Date    Alcoholism in recovery ([place]) 08/01/2017    Cancer ([place]) 2016    Prostate [name]    Clostridium difficile infection 08/01/2010    Gout     HTN (hypertension)     Hyperlipidemia     Keloids 12/2003    multiple keloids on head    Pancreatitis, alcoholic [id number]    Vitamin D deficiency        Past Surgical History:   Procedure Laterality Date    COLONOSCOPY  02/04/13    Dr Fredonia Highland - normal repeat 2024    COLONOSCOPY N/A 07/14/2017    COLONOSCOPY WITH BIOPSY performed by Phillip Reeds, MD at [place] 09/11/2017    LAPAROSCOPIC APPENDECTOMY performed by Elly Modena, MD at [place] Bilateral 12/20/14    robotic assisted laparoscopic    UPPER GASTROINTESTINAL ENDOSCOPY N/A 07/23/2017    EGD WITH ESOPHAGEAL ENDOSCOPIC ULTRASOUND performed by Phillip Spaniel, MD at [place] N/A 07/23/2017    EGD BIOPSY performed by Phillip Spaniel, MD at [place]         Family History   Problem Relation Age of Onset    Cancer Mother         lung CA    Heart Disease Father         CAD    Cancer Sister         lung CA       Social History     Tobacco Use    Smoking status: Never     Passive exposure: Never    Smokeless tobacco: Never   Vaping Use    Vaping Use: Never used   Substance Use Topics    Alcohol use: No      Alcohol/week: 0.0 standard drinks of alcohol     Comment: SOBER SINCE 08-01-17    Drug use: No       Objective     Vital Signs  BP 139/88   Ht 1.753 m ('5\' 9"'$ )   Wt 112.5 kg (248 lb)   BMI 36.62 kg/m   Wt Readings from Last 3 Encounters:   06/10/22 112.5 kg (248 lb)   12/06/21 114.8 kg (253 lb)   05/25/21 112.5 kg (248 lb)  Waist Circumference  There were no vitals filed for this visit.    Physical Exam  Vitals reviewed.   Constitutional:       General: He is not in acute distress.     Appearance: Normal appearance.   HENT:      Head: Normocephalic and atraumatic.      Mouth/Throat:      Pharynx: Oropharynx is clear.   Eyes:      Conjunctiva/sclera: Conjunctivae normal.      Pupils: Pupils are equal, round, and reactive to light.   Cardiovascular:      Rate and Rhythm: Normal rate and regular rhythm.      Pulses: Normal pulses.      Heart sounds: Normal heart sounds.   Pulmonary:      Effort: Pulmonary effort is normal. No respiratory distress.      Breath sounds: Normal breath sounds. No wheezing or rales.   Abdominal:      Palpations: Abdomen is soft.      Tenderness: There is no abdominal tenderness. There is no rebound.   Musculoskeletal:         General: No signs of injury. Normal range of motion.      Cervical back: Normal range of motion and neck supple.   Skin:     General: Skin is warm and dry.      Coloration: Skin is not jaundiced.   Neurological:      General: No focal deficit present.      Mental Status: He is alert and oriented to person, place, and time.   Psychiatric:         Behavior: Behavior normal.         Thought Content: Thought content normal.         Judgment: Judgment normal.         Assessment   Plan   1. Encounter for well adult exam without abnormal findings  2. Essential hypertension  -     metoprolol succinate (TOPROL XL) 50 MG extended release tablet; TAKE 1 TABLET BY MOUTH EVERY NIGHT, Disp-90 tablet, R-1Normal  -     amLODIPine (NORVASC) 5 MG tablet; TAKE 1 TABLET BY  MOUTH DAILY, Disp-90 tablet, R-1Normal  -     CBC; Future  -     Comprehensive Metabolic Panel; Future  3. Mixed hyperlipidemia  -     simvastatin (ZOCOR) 40 MG tablet; TAKE 1 TABLET BY MOUTH AT BEDTIME, Disp-90 tablet, R-1Normal  -     Comprehensive Metabolic Panel; Future  -     Lipid, Fasting; Future  4. Hyperlipidemia, unspecified hyperlipidemia type  -     simvastatin (ZOCOR) 40 MG tablet; TAKE 1 TABLET BY MOUTH AT BEDTIME, Disp-90 tablet, R-1Normal  5. Gout of left foot, unspecified cause, unspecified chronicity  -     allopurinol (ZYLOPRIM) 300 MG tablet; Take 1 tablet by mouth daily TAKE 1 TABLET BY MOUTH DAILY, Disp-90 tablet, R-1Normal  6. Prostate CA (HCC)  -     PSA, Prostatic Specific Antigen; Future         Personalized Preventive Plan   Current Health Maintenance Status  Immunization History   Administered Date(s) Administered    COVID-19, PFIZER Bivalent, DO NOT Dilute, (age 12y+), IM, 30 mcg/0.3 mL 05/26/2021    COVID-19, PFIZER GRAY top, DO NOT Dilute, (age 90 y+), IM, 30 mcg/0.3 mL 11/18/2020    COVID-19, PFIZER PURPLE top, DILUTE for use, (age 53 y+), 33mg/0.3mL 10/26/2019, 11/16/2019  Influenza 06/12/2010    Influenza, AFLURIA (age 344 yrs+), FLUZONE, (age 76 mo+), MDV, 0.37m 05/21/2019    Influenza, FLUARIX, FLULAVAL, FLUZONE (age 5248mo+) AND AFLURIA, (age 3444y+), PF, 0.573m09/23/2016, 04/17/2016, 03/27/2018    Influenza, FLUBLOK, (age 60+), PF, 0.1m66m0/22/2018    Influenza, FLUCELVAX, (age 52 m79+), MDCK, PF, 0.1mL82m/19/2021    Influenza, Intradermal, Preservative free 05/08/2011, 05/15/2012, 05/19/2013, 06/01/2014    TDaP, ADACEL (age 10y-57y-64yOOSTRIX (age 10y+), IM, 0.1mL 76m08/2011        Health Maintenance   Topic Date Due    Hepatitis B vaccine (1 of 3 - 3-dose series) Never done    HIV screen  Never done    Shingles vaccine (1 of 2) Never done    DTaP/Tdap/Td vaccine (2 - Td or Tdap) 06/12/2020    Flu vaccine (1) 03/05/2022    COVID-19 Vaccine (5 - 2023-24 season) 04/05/2022     Depression Screen  12/07/2022    A1C test (Diabetic or Prediabetic)  05/14/2023    Lipids  05/14/2023    Prostate Specific Antigen (PSA) Screening or Monitoring  05/14/2023    Colorectal Cancer Screen  07/15/2027    Hepatitis C screen  Completed    Hepatitis A vaccine  Aged Out    Hib vaccine  Aged Out    Meningococcal (ACWY) vaccine  Aged Out    Pneumococcal 0-64 years Vaccine  Aged Out    Diabetes screen  Discontinued     Recommendations for [employer] see orders and patient instructions/AVS.    Return in about 6 months (around 12/09/2022) for Chronic Conditions.

## 2022-06-10 NOTE — Patient Instructions (Signed)
Starting a Weight Loss Plan: Care Instructions  Overview     If you're thinking about losing weight, it can be hard to know where to start. Your doctor can help you set up a weight loss plan that best meets your needs. You may want to take a class on nutrition or exercise, or you could join a weight loss support group. If you have questions about how to make changes to your eating or exercise habits, ask your doctor about seeing a registered dietitian or an exercise specialist.  It can be a big challenge to lose weight. But you don't have to make huge changes at once. Make small changes, and stick with them. When those changes become habit, add a few more changes.  If you don't think you're ready to make changes right now, try to pick a date in the future. Make an appointment to see your doctor to discuss whether the time is right for you to start a plan.  Follow-up care is a key part of your treatment and safety. Be sure to make and go to all appointments, and call your doctor if you are having problems. It's also a good idea to know your test results and keep a list of the medicines you take.  How can you care for yourself at home?  Set realistic goals. Many people expect to lose much more weight than is likely. A weight loss of 5% to 10% of your body weight may be enough to improve your health.  Get family and friends involved to provide support. Talk to them about why you are trying to lose weight, and ask them to help. They can help by participating in exercise and having meals with you, even if they may be eating something different.  Find what works best for you. If you do not have time or do not like to cook, a program that offers meal replacement bars or shakes may be better for you. Or if you like to prepare meals, finding a plan that includes daily menus and recipes may be best.  Ask your doctor about other health professionals who can help you achieve your weight loss goals.  A dietitian can help  you make healthy changes in your diet.  An exercise specialist or personal trainer can help you develop a safe and effective exercise program.  A counselor or psychiatrist can help you cope with issues such as depression, anxiety, or family problems that can make it hard to focus on weight loss.  Consider joining a support group for people who are trying to lose weight. Your doctor can suggest groups in your area.  Where can you learn more?  Go to https://www.healthwise.net/patientEd and enter U357 to learn more about "Starting a Weight Loss Plan: Care Instructions."  Current as of: February 28, 2023Content Version: 13.8   2006-2023 [employer].   Care instructions adapted under license by [facility]. If you have questions about a medical condition or this instruction, always ask your healthcare professional. Healthwise, Incorporated disclaims any warranty or liability for your use of this information.

## 2022-11-07 ENCOUNTER — Encounter

## 2022-11-08 LAB — CBC
Hematocrit: 40.3 % — ABNORMAL LOW (ref 40.5–52.5)
Hemoglobin: 13.8 g/dL (ref 13.5–17.5)
MCH: 35.9 pg — ABNORMAL HIGH (ref 26.0–34.0)
MCHC: 34.1 g/dL (ref 31.0–36.0)
MCV: 105.1 fL — ABNORMAL HIGH (ref 80.0–100.0)
MPV: 8.7 fL (ref 5.0–10.5)
Platelets: 186 10*3/uL (ref 135–450)
RBC: 3.84 M/uL — ABNORMAL LOW (ref 4.20–5.90)
RDW: 13.2 % (ref 12.4–15.4)
WBC: 7.1 10*3/uL (ref 4.0–11.0)

## 2022-11-08 LAB — COMPREHENSIVE METABOLIC PANEL
ALT: 32 U/L (ref 10–40)
AST: 94 U/L — ABNORMAL HIGH (ref 15–37)
Albumin/Globulin Ratio: 0.9 — ABNORMAL LOW (ref 1.1–2.2)
Albumin: 4.1 g/dL (ref 3.4–5.0)
Alkaline Phosphatase: 130 U/L — ABNORMAL HIGH (ref 40–129)
Anion Gap: 15 (ref 3–16)
BUN: 3 mg/dL — ABNORMAL LOW (ref 7–20)
CO2: 20 mmol/L — ABNORMAL LOW (ref 21–32)
Calcium: 9.4 mg/dL (ref 8.3–10.6)
Chloride: 100 mmol/L (ref 99–110)
Creatinine: 0.6 mg/dL — ABNORMAL LOW (ref 0.8–1.3)
Est, Glom Filt Rate: >90 (ref >60)
Glucose: 128 mg/dL — ABNORMAL HIGH (ref 70–99)
Potassium: 4.3 mmol/L (ref 3.5–5.1)
Sodium: 135 mmol/L — ABNORMAL LOW (ref 136–145)
Total Bilirubin: 0.4 mg/dL (ref 0.0–1.0)
Total Protein: 8.9 g/dL — ABNORMAL HIGH (ref 6.4–8.2)

## 2022-11-08 LAB — PSA PROSTATIC SPECIFIC ANTIGEN: PSA: <0.01 ng/mL (ref 0.00–4.00)

## 2022-11-08 LAB — LIPID, FASTING
Cholesterol, Fasting: 113 mg/dL (ref 0–199)
HDL: 40 mg/dL (ref 40–60)
LDL Calculated: 55 mg/dL (ref <100)
Triglyceride, Fasting: 92 mg/dL (ref 0–150)
VLDL Cholesterol Calculated: 18 mg/dL

## 2022-11-08 NOTE — Other (Signed)
Your labs are acceptable.  No immediate changes are required.  We will discuss more at the visit.

## 2022-12-05 IMAGING — MR MRI LUMBAR WITHOUT CONTRAST
2 of 8 series · 5 of 48 positions shown · non-contrast
Comparison: None

MRI LUMBAR WITHOUT CONTRAST , 12/05/2022 [DATE]: 
CLINICAL INDICATION: Low back pain.
TECHNIQUE: Multiplanar, multiecho position MR images of the lumbar spine were 
performed without contrast.

[Series 3: T2 · sagittal · 4.0mm · 0.26mm/px · 3 of 18 slices shown (1 of 2)]
[im 1/18]
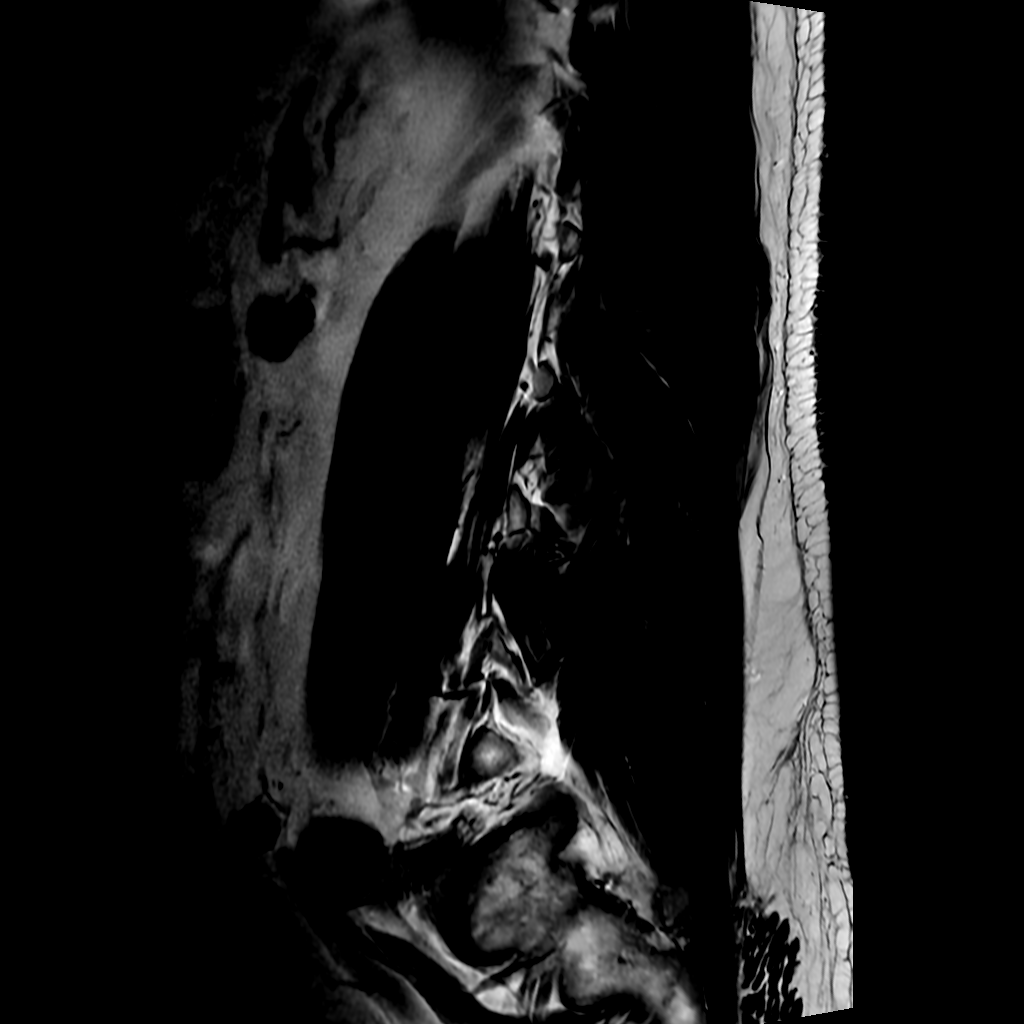
[im 9/18]
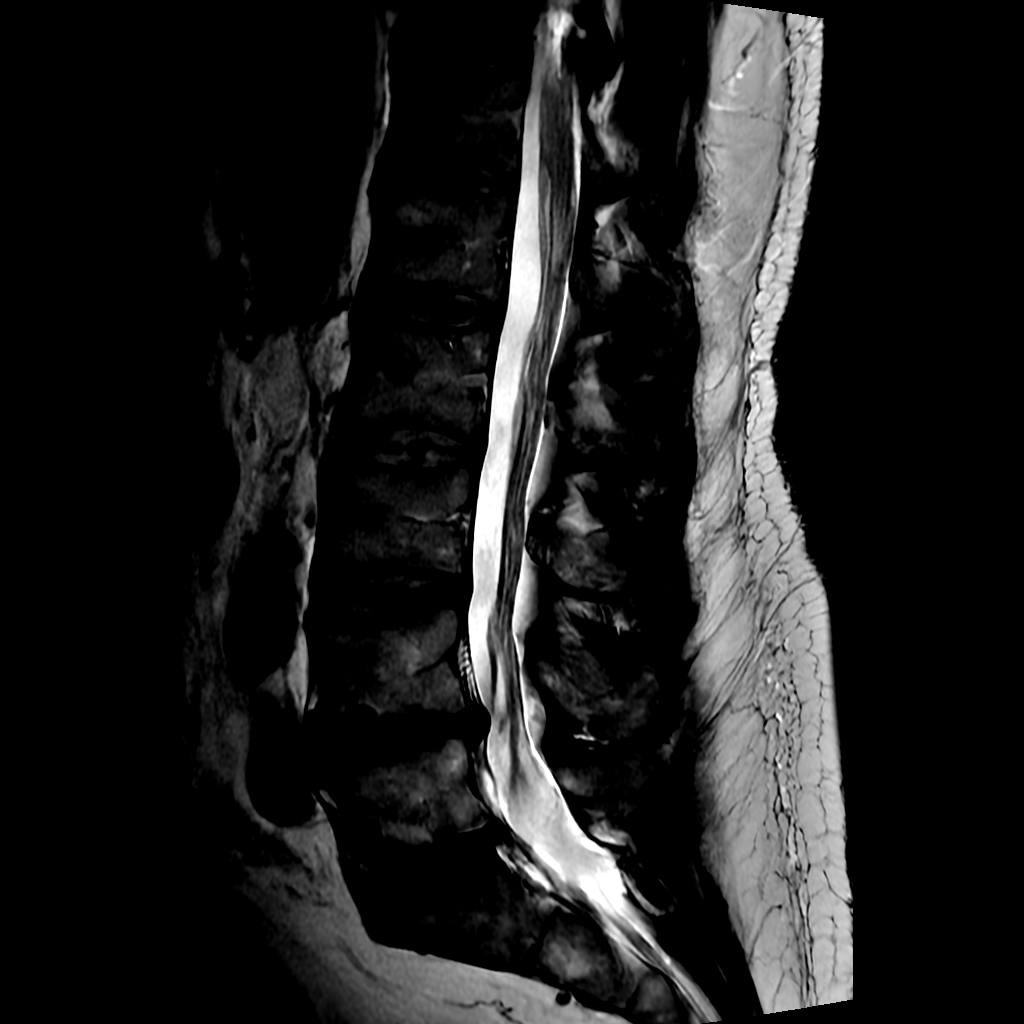
[im 18/18]
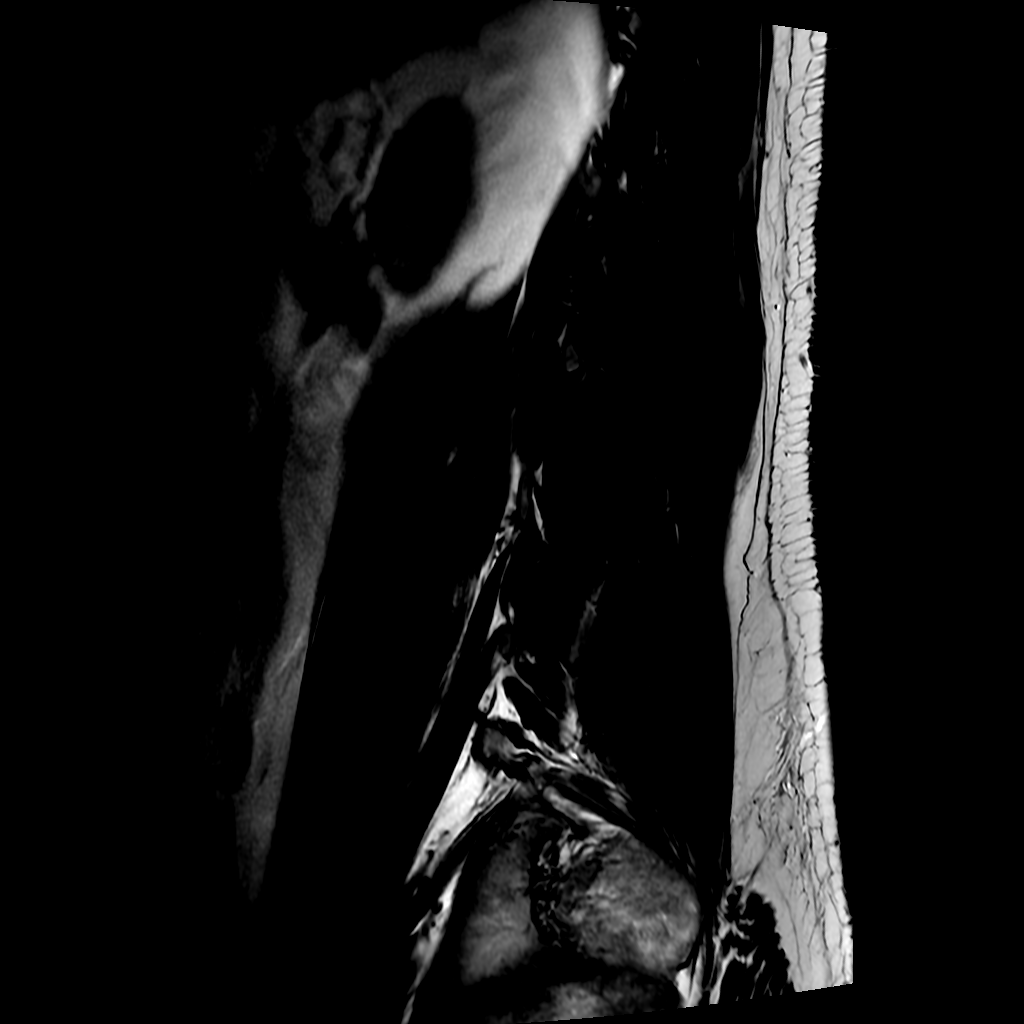

[Series 6: T2 · axial · 4.0mm · 0.23mm/px · z∈[-106,-63]mm · 2 of 30 slices shown (2 of 2)]
[im 5/30]
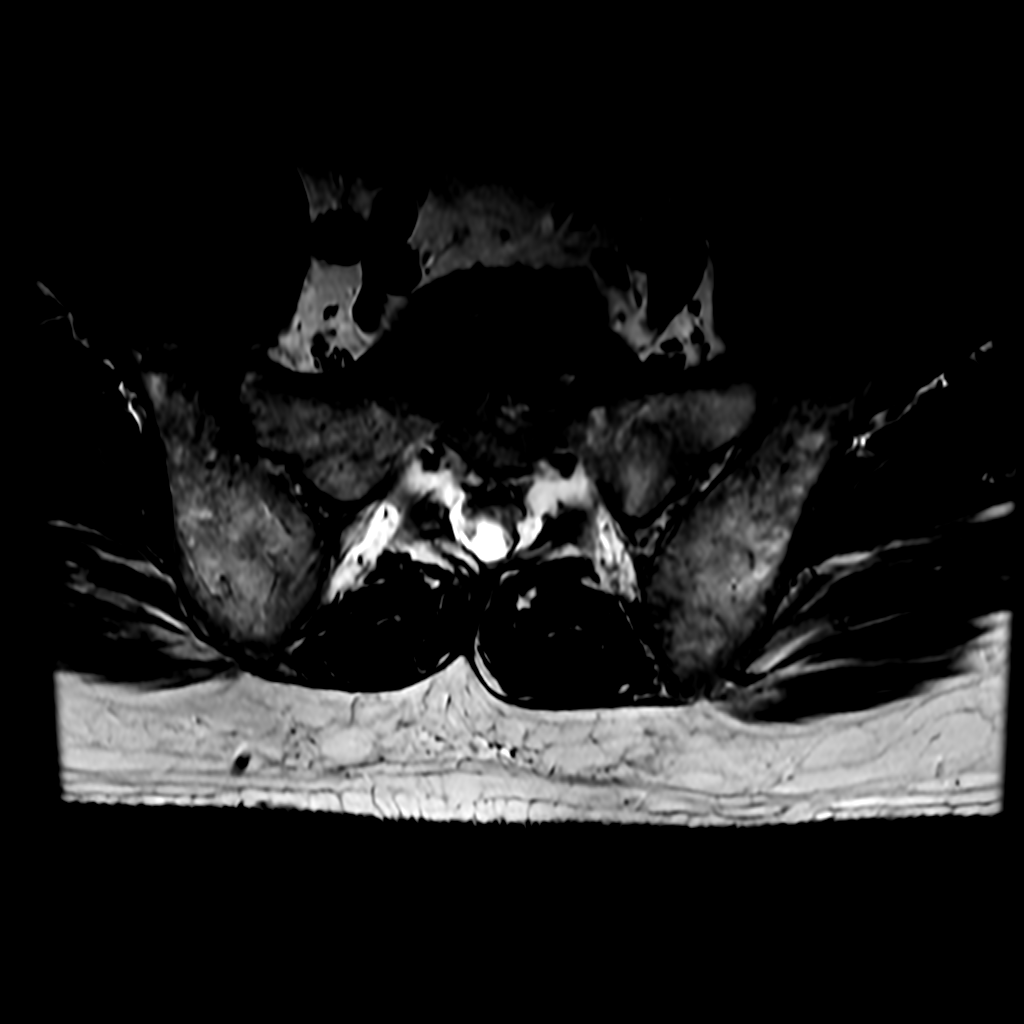
[im 15/30]
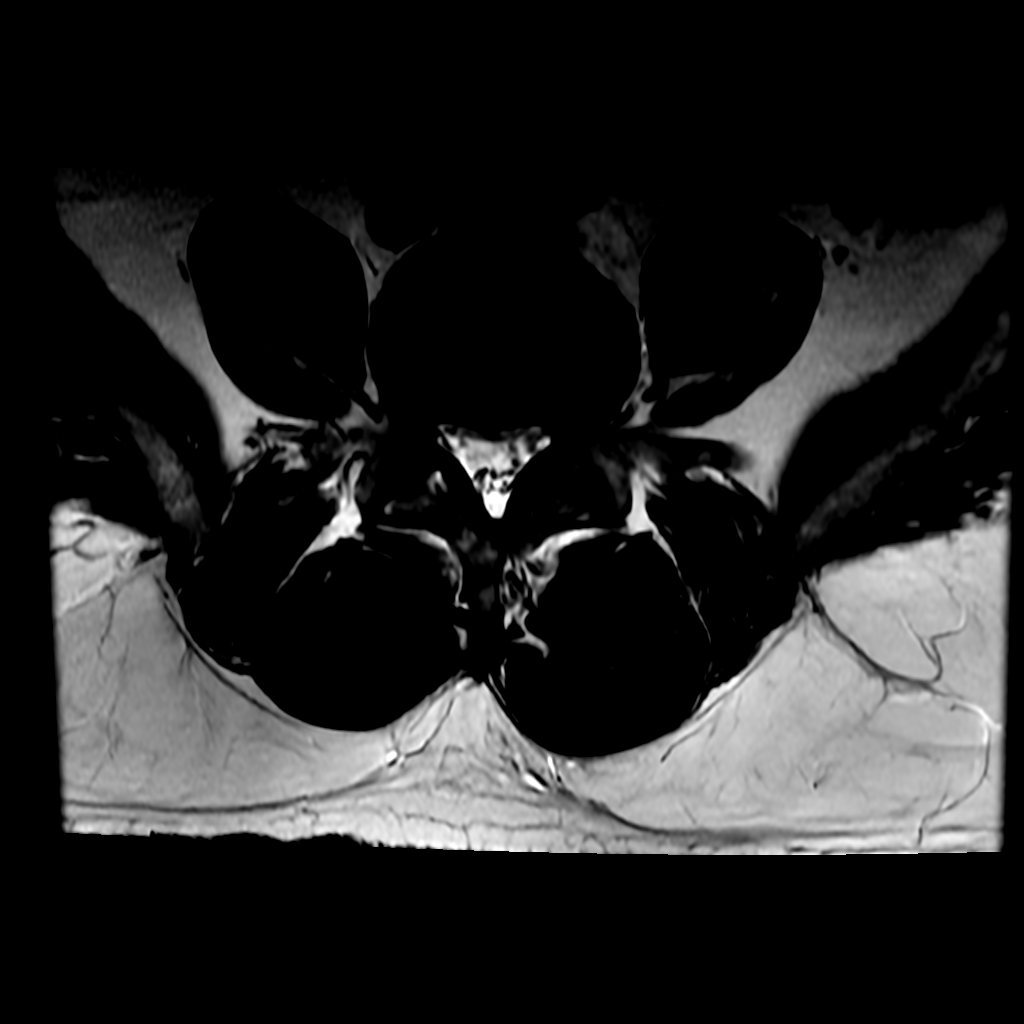

[5 of 48 positions shown; findings below may reference images not displayed]

FINDINGS: -------------------------------------------------------------------------------- 
------ 
GENERAL: 
Nomenclature is based on 5 lumbar type vertebral bodies.     
ALIGNMENT: Slight loss of the normal lumbar lordosis. Otherwise there is trace 
grade 1 retrolisthesis L5 on S1. 
VERTEBRAL BODY HEIGHT: Normal.  
MARROW SIGNAL: There is hypointense T1 and T2 signal with hyperintense STIR 
signal within the anterior inferior L5 vertebral body, adjacent to the endplate, 
thought to most likely reflect type I Modic endplate signal changes. No 
convincing evidence of a marrow infiltrative process. 
CORD SIGNAL: Normal distal spinal cord and cauda equina. Conus medullaris 
terminates at L1. 
ADDITIONAL FINDINGS: 5 mm right renal cyst. 
Modic I-II: L4-L5, L5-S1. 
Ligamentum Flavum > 2.5 mm: All levels. 
-------------------------------------------------------------------------------- 
------ 
SEGMENTAL: 
T12-L1: Slight loss of disc signal. Otherwise normal. 
L1-L2: Slight loss of disc signal. Otherwise normal. 
L2-L3: Slight loss of disc signal. Canal and foramina are patent. Small 
bilateral facet joint effusions. 
L3-L4: Slight loss of disc signal. Mild annular bulge. Canal and foramina are 
patent. Small right facet joint effusion. 
L4-L5: Mild loss of disc height and signal with Schmorls node and vacuum disc 
phenomenon. Annular bulge with annular fissure. Mild canal stenosis. Narrowing 
of the lateral recesses bilaterally. Small right facet joint effusion. Foramina 
patent. 
L5-S1: Loss of disc signal. Mild annular bulge. Canal patent. Mild facet 
arthropathy. Left foramen patent. Right foramen is borderline narrowed. 
-------------------------------------------------------------------------------- 
------
IMPRESSION: Signal changes involving the anterior and inferior aspect of the L5 vertebral 
body are thought related to type I Modic endplate signal changes. If there is 
further clinical concern for possible lesion in the L5 vertebral body, although 
thought unlikely, could correlate with bone scan and or follow-up short term 
lumbar spine MRI in 2-3 months. 
Mild lumbar degenerative changes otherwise detailed above without critical or 
significant canal stenosis. 
Annular fissure L4-L5 level may be a pain generator with other, less significant 
degenerative changes as above.

## 2022-12-09 ENCOUNTER — Ambulatory Visit
Admit: 2022-12-09 | Discharge: 2022-12-09 | Payer: BLUE CROSS/BLUE SHIELD | Attending: Internal Medicine | Primary: Internal Medicine

## 2022-12-09 DIAGNOSIS — R739 Hyperglycemia, unspecified: Secondary | ICD-10-CM

## 2022-12-09 MED ORDER — HYDRALAZINE HCL 25 MG PO TABS
25 MG | ORAL_TABLET | ORAL | 1 refills | Status: AC
Start: 2022-12-09 — End: 2023-06-12

## 2022-12-09 MED ORDER — POTASSIUM CHLORIDE CRYS ER 10 MEQ PO TBCR
10 MEQ | ORAL_TABLET | ORAL | 1 refills | Status: AC
Start: 2022-12-09 — End: 2023-06-12

## 2022-12-09 MED ORDER — SIMVASTATIN 40 MG PO TABS
40 MG | ORAL_TABLET | ORAL | 1 refills | Status: AC
Start: 2022-12-09 — End: 2023-06-12

## 2022-12-09 MED ORDER — AMLODIPINE BESYLATE 5 MG PO TABS
5 MG | ORAL_TABLET | ORAL | 1 refills | Status: AC
Start: 2022-12-09 — End: 2023-06-12

## 2022-12-09 MED ORDER — METOPROLOL SUCCINATE ER 50 MG PO TB24
50 MG | ORAL_TABLET | ORAL | 1 refills | Status: AC
Start: 2022-12-09 — End: 2023-06-12

## 2022-12-09 NOTE — Progress Notes (Signed)
[facility] Medicine  Follow-up visit   12/09/2022    Phillip Harper. (DOB:  09/04/1960) is a 62 y.o. male, here for follow-up:    Chief Complaint   Patient presents with    Follow-up Chronic Condition    Hypertension        HPI    Hyperlipidemia  This is a chronic problem. The current episode started more than 1 year ago. The problem is controlled. Recent lipid tests were reviewed and are low. There are no known factors aggravating his hyperlipidemia. Current antihyperlipidemic treatment includes diet change, exercise and statins. The current treatment provides significant improvement of lipids. There are no compliance problems.  Risk factors for coronary artery disease include dyslipidemia, hypertension, male sex and obesity.   Hypertension  This is a chronic problem. The current episode started more than 1 year ago. The problem is unchanged. The problem is controlled. Associated symptoms include anxiety. Risk factors for coronary artery disease include dyslipidemia, male gender and obesity. Past treatments include beta blockers, lifestyle changes, calcium channel blockers and diuretics. The current treatment provides significant improvement. There are no compliance problems.   Gout  This is a chronic problem. The current episode started more than 1 year ago. The problem is controlled.  Current therapies include lifestyle modification as well as allopurinol 300 mg p.o. daily.  Has colchicine to use as needed for attacks, but has not had to use this recently.  Vitamin D deficiency  This is a chronic problem. The current episode started more than 1 year ago. The problem is controlled.  Current therapies include lifestyle modification as well as vitamin D supplementation..    Prostate cancer  Patient had Gleason 3+4= 7 disease and is status post robotic prostatectomy by Dr. Shona Simpson 12/2014.  PSA has been undetectable since that time.  Lymphocytic colitis  This was diagnosed initially in 2018.  He has had  colonoscopies in 07/07/19/2018, 08/2017, and 01/2020.  He was seen by Rowe Pavy, MD.  His last colonoscopy was done by [facility].  He was on Entocort, but weaned off of this.  Currently his symptoms are well controlled.    ROS  Review of Systems   Constitutional:  Negative for activity change, appetite change, chills, diaphoresis, fatigue, fever and unexpected weight change.   HENT:  Negative for sinus pressure, sinus pain, sore throat, trouble swallowing and voice change.    Eyes:  Negative for visual disturbance.   Respiratory:  Negative for cough, chest tightness, shortness of breath and wheezing.    Cardiovascular:  Negative for chest pain, palpitations and leg swelling.   Gastrointestinal:  Negative for abdominal distention, abdominal pain, blood in stool, constipation, diarrhea, nausea and vomiting.   Endocrine: Negative for polydipsia and polyphagia.   Genitourinary:  Negative for decreased urine volume, difficulty urinating, dysuria and urgency.   Musculoskeletal:  Negative for back pain, gait problem, joint swelling and myalgias.   Neurological:  Negative for dizziness, seizures, syncope, light-headedness and headaches.   Psychiatric/Behavioral:  Negative for agitation, behavioral problems, confusion and suicidal ideas.        HISTORIES  Current Outpatient Medications on File Prior to Visit   Medication Sig Dispense Refill    allopurinol (ZYLOPRIM) 300 MG tablet Take 1 tablet by mouth daily TAKE 1 TABLET BY MOUTH DAILY 90 tablet 1    colchicine (COLCRYS) 0.6 MG tablet 1 po prn for gout attack may repeat in 2 hours no more than 2/24 hrs 10 tablet 1  Loperamide HCl (IMODIUM A-D PO) Take 2 tablets by mouth daily as needed       Vitamin D (CHOLECALCIFEROL) 1000 UNITS CAPS capsule Take 2 capsules by mouth daily       No current facility-administered medications on file prior to visit.      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    Vitamin D deficiency      Patient Active Problem List   Diagnosis    Gout    Hyperlipidemia    HTN (hypertension)    Vitamin D deficiency    Alcohol abuse    Lymphocytic colitis    Prostate CA (HCC)    Acne keloidalis nuchae    Hyperglycemia    Abnormal LFTs    Acute pain of left knee    Severe obesity (BMI 35.0-39.9) with comorbidity [facility])     Past Surgical History:   Procedure Laterality Date    COLONOSCOPY  02/04/13    Dr Lorre Munroe - normal repeat 2024    COLONOSCOPY N/A 07/14/2017    COLONOSCOPY WITH BIOPSY performed by Debria Garret, MD at [facility] ENDOSCOPY    LAPAROSCOPIC APPENDECTOMY N/A 09/11/2017    LAPAROSCOPIC APPENDECTOMY performed by Gardiner Fanti, MD at [facility] OR    OTHER SURGICAL HISTORY      KELOID SURGERY    PROSTATE BIOPSY      PROSTATECTOMY Bilateral 12/20/14    robotic assisted laparoscopic    UPPER GASTROINTESTINAL ENDOSCOPY N/A 07/23/2017    EGD WITH ESOPHAGEAL ENDOSCOPIC ULTRASOUND performed by Gerrie Nordmann, MD at [facility] ENDOSCOPY    UPPER GASTROINTESTINAL ENDOSCOPY N/A 07/23/2017    EGD BIOPSY performed by Gerrie Nordmann, MD at [facility] ENDOSCOPY     Social History     Socioeconomic History    Marital status: Legally Separated     Spouse name: Not on file    Number of children: Not on file    Years of education: Not on file    Highest education level: Not on file   Occupational History    Not on file   Tobacco Use    Smoking status: Never     Passive exposure: Never    Smokeless tobacco: Never   Vaping Use    Vaping Use: Never used   Substance and Sexual Activity    Alcohol use: No     Alcohol/week: 0.0 standard drinks of alcohol     Comment: SOBER SINCE 08-01-17    Drug use: No    Sexual  activity: Yes     Partners: Female   Other Topics Concern    Not on file   Social History Narrative    Not on file     Social Determinants of Health     Financial Resource Strain: Low Risk  (12/09/2022)    Overall Financial Resource Strain (CARDIA)     Difficulty of Paying Living Expenses: Not hard at all   Food Insecurity: No Food Insecurity (12/09/2022)  Hunger Vital Sign     Worried About Running Out of Food in the Last Year: Never true     Ran Out of Food in the Last Year: Never true   Transportation Needs: Unknown (12/09/2022)    PRAPARE - Therapist, art (Medical): Not on file     Lack of Transportation (Non-Medical): No   Physical Activity: Not on file   Stress: Not on file   Social Connections: Not on file   Intimate Partner Violence: Not on file   Housing Stability: Unknown (12/09/2022)    Housing Stability Vital Sign     Unable to Pay for Housing in the Last Year: Not on file     Number of Places Lived in the Last Year: Not on file     Unstable Housing in the Last Year: No      Family History   Problem Relation Age of Onset    Cancer Mother         lung CA    Heart Disease Father         CAD    Cancer Sister         lung CA       PE  Vitals:    12/09/22 1532 12/09/22 1534   BP: (!) 150/80 (!) 150/80   Site: Right Upper Arm    Position: Sitting    Pulse: 84    Temp: 98 F (36.7 C)    SpO2: 98%    Weight: 112.1 kg (247 lb 3.2 oz)      Estimated body mass index is 36.51 kg/m as calculated from the following:    Height as of 06/10/22: 1.753 m (5\' 9" ).    Weight as of this encounter: 112.1 kg (247 lb 3.2 oz).    Physical Exam  Vitals reviewed.   Constitutional:       General: He is not in acute distress.     Appearance: Normal appearance.   HENT:      Head: Normocephalic and atraumatic.      Mouth/Throat:      Pharynx: Oropharynx is clear.   Eyes:      Conjunctiva/sclera: Conjunctivae normal.      Pupils: Pupils are equal, round, and reactive to light.   Cardiovascular:      Rate and  Rhythm: Normal rate and regular rhythm.      Pulses: Normal pulses.      Heart sounds: Normal heart sounds.   Pulmonary:      Effort: Pulmonary effort is normal. No respiratory distress.      Breath sounds: Normal breath sounds. No wheezing or rales.   Abdominal:      Palpations: Abdomen is soft.      Tenderness: There is no abdominal tenderness. There is no rebound.   Musculoskeletal:         General: No signs of injury. Normal range of motion.      Cervical back: Normal range of motion and neck supple.   Skin:     General: Skin is warm and dry.      Coloration: Skin is not jaundiced.   Neurological:      General: No focal deficit present.      Mental Status: He is alert and oriented to person, place, and time.   Psychiatric:         Behavior: Behavior normal.         Thought Content: Thought content normal.  Judgment: Judgment normal.         ASSESSMENT/ PLAN:  1. Hyperglycemia  -Reassessing.  - Hemoglobin A1C; Future    2. Mixed hyperlipidemia  -Well-controlled.  - simvastatin (ZOCOR) 40 MG tablet; TAKE 1 TABLET BY MOUTH AT BEDTIME  Dispense: 90 tablet; Refill: 1    3. Primary hypertension  -No changes in medication today.  Reassess at the next visit in 3 months.    4. Gout of left foot, unspecified cause, unspecified chronicity  -Stable/well-controlled.    5. Prostate CA (HCC)  -As above.    6. Vitamin D deficiency    7. Essential hypertension  -No changes in medication today.  Reassess at the next visit in 3 months.  - amLODIPine (NORVASC) 5 MG tablet; TAKE 1 TABLET BY MOUTH DAILY  Dispense: 90 tablet; Refill: 1  - hydrALAZINE (APRESOLINE) 25 MG tablet; TAKE 1 TABLET BY MOUTH EVERY 8 HOURS  Dispense: 270 tablet; Refill: 1  - metoprolol succinate (TOPROL XL) 50 MG extended release tablet; TAKE 1 TABLET BY MOUTH EVERY NIGHT  Dispense: 90 tablet; Refill: 1  - potassium chloride (KLOR-CON M) 10 MEQ extended release tablet; TAKE 1 TABLET BY MOUTH THREE TIMES DAILY  Dispense: 270 tablet; Refill: 1    8.  Hyperlipidemia, unspecified hyperlipidemia type  - simvastatin (ZOCOR) 40 MG tablet; TAKE 1 TABLET BY MOUTH AT BEDTIME  Dispense: 90 tablet; Refill: 1       Orders Placed This Encounter   Procedures    Hemoglobin A1C      Orders Placed This Encounter   Medications    amLODIPine (NORVASC) 5 MG tablet     Sig: TAKE 1 TABLET BY MOUTH DAILY     Dispense:  90 tablet     Refill:  1    hydrALAZINE (APRESOLINE) 25 MG tablet     Sig: TAKE 1 TABLET BY MOUTH EVERY 8 HOURS     Dispense:  270 tablet     Refill:  1    metoprolol succinate (TOPROL XL) 50 MG extended release tablet     Sig: TAKE 1 TABLET BY MOUTH EVERY NIGHT     Dispense:  90 tablet     Refill:  1    potassium chloride (KLOR-CON M) 10 MEQ extended release tablet     Sig: TAKE 1 TABLET BY MOUTH THREE TIMES DAILY     Dispense:  270 tablet     Refill:  1    simvastatin (ZOCOR) 40 MG tablet     Sig: TAKE 1 TABLET BY MOUTH AT BEDTIME     Dispense:  90 tablet     Refill:  1      Medications Discontinued During This Encounter   Medication Reason    potassium chloride (KLOR-CON M) 10 MEQ extended release tablet REORDER    hydrALAZINE (APRESOLINE) 25 MG tablet REORDER    metoprolol succinate (TOPROL XL) 50 MG extended release tablet REORDER    simvastatin (ZOCOR) 40 MG tablet REORDER    amLODIPine (NORVASC) 5 MG tablet REORDER        Return in about 3 months (around 03/11/2023) for Annual Physical .    Virgilio Belling, MD    This dictation was generated by voice recognition computer software.  Although all attempts are made to edit the dictation for accuracy, there may be errors in the transcription that are not intended.

## 2022-12-09 NOTE — Addendum Note (Signed)
Addended by: Virgilio Belling on: 12/09/2022 04:06 PM     Modules accepted: Level of Service

## 2023-01-07 ENCOUNTER — Encounter

## 2023-01-08 MED ORDER — ALLOPURINOL 300 MG PO TABS
300 MG | ORAL_TABLET | Freq: Every day | ORAL | 1 refills | Status: AC
Start: 2023-01-08 — End: 2023-03-17

## 2023-03-16 ENCOUNTER — Encounter

## 2023-03-17 MED ORDER — ALLOPURINOL 300 MG PO TABS
300 MG | ORAL_TABLET | Freq: Every day | ORAL | 1 refills | Status: DC
Start: 2023-03-17 — End: 2023-06-12

## 2023-05-31 ENCOUNTER — Encounter

## 2023-06-01 LAB — HEMOGLOBIN A1C
Estimated Avg Glucose: 122.6 mg/dL
Hemoglobin A1C: 5.9 %

## 2023-06-12 ENCOUNTER — Encounter
Admit: 2023-06-12 | Discharge: 2023-06-12 | Payer: BLUE CROSS/BLUE SHIELD | Attending: Internal Medicine | Primary: Internal Medicine

## 2023-06-12 ENCOUNTER — Encounter

## 2023-06-12 VITALS — BP 122/82 | HR 86 | Ht 69.0 in | Wt 254.0 lb

## 2023-06-12 DIAGNOSIS — Z23 Encounter for immunization: Secondary | ICD-10-CM

## 2023-06-12 MED ORDER — COLCHICINE 0.6 MG PO TABS
0.6 | ORAL_TABLET | ORAL | 1 refills | 15.00 days | Status: DC
Start: 2023-06-12 — End: 2023-12-10

## 2023-06-12 MED ORDER — HYDRALAZINE HCL 25 MG PO TABS
25 MG | ORAL_TABLET | ORAL | 1 refills | Status: DC
Start: 2023-06-12 — End: 2023-12-10

## 2023-06-12 MED ORDER — ALLOPURINOL 300 MG PO TABS
300 MG | ORAL_TABLET | Freq: Every day | ORAL | 1 refills | Status: DC
Start: 2023-06-12 — End: 2023-12-10

## 2023-06-12 MED ORDER — AMLODIPINE BESYLATE 5 MG PO TABS
5 | ORAL_TABLET | ORAL | 1 refills | 30.00 days | Status: DC
Start: 2023-06-12 — End: 2023-12-10

## 2023-06-12 MED ORDER — POTASSIUM CHLORIDE CRYS ER 10 MEQ PO TBCR
10 MEQ | ORAL_TABLET | ORAL | 1 refills | Status: DC
Start: 2023-06-12 — End: 2023-12-10

## 2023-06-12 MED ORDER — METOPROLOL SUCCINATE ER 50 MG PO TB24
50 | ORAL_TABLET | ORAL | 1 refills | 60.00 days | Status: DC
Start: 2023-06-12 — End: 2023-12-10

## 2023-06-12 MED ORDER — SIMVASTATIN 40 MG PO TABS
40 MG | ORAL_TABLET | ORAL | 1 refills | Status: DC
Start: 2023-06-12 — End: 2023-12-10

## 2023-06-12 NOTE — Patient Instructions (Signed)
Well Visit, Ages 78 to 82: Care Instructions  Well visits can help you stay healthy. Your doctor has checked your overall health and may have suggested ways to take good care of yourself. Your doctor also may have recommended tests. You can help prevent illness with healthy eating, good sleep, vaccinations, regular exercise, and other steps.    Get the tests that you and your doctor decide on. Depending on your age and risks, examples might include screening for diabetes; hepatitis C; HIV; and cervical, breast, lung, and colon cancer. Screening helps find diseases before any symptoms appear.   Eat healthy foods. Choose fruits, vegetables, whole grains, lean protein, and low-fat dairy foods. Limit saturated fat and reduce salt.     Limit alcohol. Men should have no more than 2 drinks a day. Women should have no more than 1. For some people, no alcohol is the best choice.   Exercise. Get at least 30 minutes of exercise on most days of the week. Walking can be a good choice.     Reach and stay at your healthy weight. This will lower your risk for many health problems.   Take care of your mental health. Try to stay connected with friends, family, and community, and find ways to manage stress.     If you're feeling depressed or hopeless, talk to someone. A counselor can help. If you don't have a counselor, talk to your doctor.   Talk to your doctor if you think you may have a problem with alcohol or drug use. This includes prescription medicines, marijuana, and other drugs.     Avoid tobacco and nicotine: Don't smoke, vape, or chew. If you need help quitting, talk to your doctor.   Practice safer sex. Getting tested, using condoms or dental dams, and limiting sex partners can help prevent STIs.     Use birth control if it's important to you to prevent pregnancy. Talk with your doctor about your choices and what might be best for you.   Prevent problems where you can. Protect your skin from too much sun, wash your  hands, brush your teeth twice a day, and wear a seat belt in the car.   Where can you learn more?  Go to [employer] and enter P072 to learn more about "Well Visit, Ages 20 to 24: Care Instructions."  Current as of: March 10, 2022  Content Version: 14.2   [address], [place].   Care instructions adapted under license by [facility]. If you have questions about a medical condition or this instruction, always ask your healthcare professional. [employer] disclaims any warranty or liability for your use of this information.

## 2023-06-12 NOTE — Progress Notes (Signed)
Well Adult Note  Name: Phillip Harper. Today's Date: 06/12/2023   MRN: [id number] Sex: Male   Age: 62 y.o. Ethnicity: Non-Hispanic / Non Latino   DOB: 1960/12/21 Race: Black / African American      Phillip Harper. is here for a well adult exam.       Subjective   History:    Hyperlipidemia  This is a chronic problem. The current episode started more than 1 year ago. The problem is controlled. Recent lipid tests were reviewed and are low. There are no known factors aggravating his hyperlipidemia. Current antihyperlipidemic treatment includes diet change, exercise and statins. The current treatment provides significant improvement of lipids. There are no compliance problems.  Risk factors for coronary artery disease include dyslipidemia, hypertension, male sex and obesity.   Hypertension  This is a chronic problem. The current episode started more than 1 year ago. The problem is unchanged. The problem is controlled. Associated symptoms include anxiety. Risk factors for coronary artery disease include dyslipidemia, male gender and obesity. Past treatments include beta blockers, lifestyle changes, calcium channel blockers and diuretics. The current treatment provides significant improvement. There are no compliance problems.   Gout  This is a chronic problem. The current episode started more than 1 year ago. The problem is controlled.  Current therapies include lifestyle modification as well as allopurinol 300 mg p.o. daily.  Has colchicine to use as needed for attacks, but has not had to use this recently.  Vitamin D deficiency  This is a chronic problem. The current episode started more than 1 year ago. The problem is controlled.  Current therapies include lifestyle modification as well as vitamin D supplementation..    Prostate cancer  Patient had Gleason 3+4= 7 disease and is status post robotic prostatectomy by Dr. Shona Simpson 12/2014.  PSA has been undetectable since that time.  Lymphocytic colitis  This was  diagnosed initially in 2018.  He has had colonoscopies in 07/07/19/2018, 08/2017, and 01/2020.  He was seen by Rowe Pavy, MD.  His last colonoscopy was done by [facility].  He was on Entocort, but weaned off of this.  Currently his symptoms are well controlled.    Review of Systems   Constitutional:  Negative for activity change, appetite change, chills, diaphoresis, fatigue, fever and unexpected weight change.   HENT:  Negative for sinus pressure, sinus pain, sore throat, trouble swallowing and voice change.    Eyes:  Negative for visual disturbance.   Respiratory:  Negative for cough, chest tightness, shortness of breath and wheezing.    Cardiovascular:  Negative for chest pain, palpitations and leg swelling.   Gastrointestinal:  Negative for abdominal distention, abdominal pain, blood in stool, constipation, diarrhea, nausea and vomiting.   Endocrine: Negative for polydipsia and polyphagia.   Genitourinary:  Negative for decreased urine volume, difficulty urinating, dysuria and urgency.   Musculoskeletal:  Negative for back pain, gait problem, joint swelling and myalgias.   Neurological:  Negative for dizziness, seizures, syncope, light-headedness and headaches.   Psychiatric/Behavioral:  Negative for agitation, behavioral problems, confusion and suicidal ideas.        Allergies   Allergen Reactions    Shellfish-Derived Products Swelling     ORAL EDEMA, ORAL ITCHING     Prior to Visit Medications    Medication Sig Taking? Authorizing Provider   allopurinol (ZYLOPRIM) 300 MG tablet Take 1 tablet by mouth daily Yes Virgilio Belling, MD   amLODIPine (NORVASC) 5 MG tablet TAKE 1  TABLET BY MOUTH DAILY Yes Virgilio Belling, MD   colchicine (COLCRYS) 0.6 MG tablet 1 po prn for gout attack may repeat in 2 hours no more than 2/24 hrs Yes Virgilio Belling, MD   hydrALAZINE (APRESOLINE) 25 MG tablet TAKE 1 TABLET BY MOUTH EVERY 8 HOURS Yes Virgilio Belling, MD   metoprolol succinate (TOPROL XL) 50 MG extended release tablet TAKE 1  TABLET BY MOUTH EVERY NIGHT Yes Virgilio Belling, MD   potassium chloride (KLOR-CON M) 10 MEQ extended release tablet TAKE 1 TABLET BY MOUTH THREE TIMES DAILY Yes Virgilio Belling, MD   simvastatin (ZOCOR) 40 MG tablet TAKE 1 TABLET BY MOUTH AT BEDTIME Yes Virgilio Belling, MD   Loperamide HCl (IMODIUM A-D PO) Take 2 tablets by mouth daily as needed  Yes [provider]   Vitamin D (CHOLECALCIFEROL) 1000 UNITS CAPS capsule Take 2 capsules by mouth daily Yes [provider]     Past Medical History:   Diagnosis Date    Alcoholism in recovery (HCC) 08/01/2017    Cancer (HCC) 2016    Prostate Dr Elgie Congo    Clostridium difficile infection 08/01/2010    Gout     HTN (hypertension)     Hyperlipidemia     Keloids 12/2003    multiple keloids on head    Pancreatitis, 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    Vitamin D deficiency      Past Surgical History:   Procedure Laterality Date    COLONOSCOPY  02/04/13    Dr Lorre Munroe - normal repeat 2024    COLONOSCOPY N/A 07/14/2017    COLONOSCOPY WITH BIOPSY performed by Debria Garret, MD at [facility] ASC ENDOSCOPY    LAPAROSCOPIC APPENDECTOMY N/A 09/11/2017    LAPAROSCOPIC APPENDECTOMY performed by Gardiner Fanti, MD at [facility] OR    OTHER SURGICAL HISTORY      KELOID SURGERY    PROSTATE BIOPSY      PROSTATECTOMY Bilateral 12/20/14    robotic assisted laparoscopic    UPPER GASTROINTESTINAL ENDOSCOPY N/A 07/23/2017    EGD WITH ESOPHAGEAL ENDOSCOPIC ULTRASOUND performed by Gerrie Nordmann, MD at [facility] ASC ENDOSCOPY    UPPER GASTROINTESTINAL ENDOSCOPY N/A 07/23/2017    EGD BIOPSY performed by Gerrie Nordmann, MD at [facility] ASC ENDOSCOPY     Family History   Problem Relation Age of Onset    Cancer Mother         lung CA    Heart Disease Father         CAD    Cancer Sister         lung CA     Social History     Tobacco Use    Smoking status: Never     Passive exposure: Never    Smokeless tobacco: Never   Vaping Use    Vaping status: Never Used   Substance Use Topics    Alcohol use: No      Alcohol/week: 0.0 standard drinks of alcohol     Comment: SOBER SINCE 08-01-17    Drug use: No           Objective   Vital Signs  BP 122/82   Pulse 86   Ht 1.753 m (5\' 9" )   Wt 115.2 kg (254 lb)   SpO2 98%   BMI 37.51 kg/m     Wt Readings from Last 3 Encounters:   06/12/23 115.2 kg (254 lb)   12/09/22 112.1 kg (247 lb 3.2 oz)   06/10/22 112.5 kg (248 lb)  Physical Exam  Vitals reviewed.   Constitutional:       General: He is not in acute distress.     Appearance: Normal appearance.   HENT:      Head: Normocephalic and atraumatic.      Mouth/Throat:      Pharynx: Oropharynx is clear.   Eyes:      Conjunctiva/sclera: Conjunctivae normal.      Pupils: Pupils are equal, round, and reactive to light.   Cardiovascular:      Rate and Rhythm: Normal rate and regular rhythm.      Pulses: Normal pulses.      Heart sounds: Normal heart sounds.   Pulmonary:      Effort: Pulmonary effort is normal. No respiratory distress.      Breath sounds: Normal breath sounds. No wheezing or rales.   Abdominal:      Palpations: Abdomen is soft.      Tenderness: There is no abdominal tenderness. There is no rebound.   Musculoskeletal:         General: No signs of injury. Normal range of motion.      Cervical back: Normal range of motion and neck supple.   Skin:     General: Skin is warm and dry.      Coloration: Skin is not jaundiced.   Neurological:      General: No focal deficit present.      Mental Status: He is alert and oriented to person, place, and time.   Psychiatric:         Behavior: Behavior normal.         Thought Content: Thought content normal.         Judgment: Judgment normal.             Assessment & Plan   Need for influenza vaccination  -     Influenza, FLUCELVAX Trivalent, (age 87 mo+) IM, Preservative Free, 0.43mL  Encounter for well adult exam without abnormal findings  Gout of left foot, unspecified cause, unspecified chronicity  -     allopurinol (ZYLOPRIM) 300 MG tablet; Take 1 tablet by mouth daily, Disp-90  tablet, R-1Normal  -     colchicine (COLCRYS) 0.6 MG tablet; 1 po prn for gout attack may repeat in 2 hours no more than 2/24 hrs, Disp-10 tablet, R-1Normal  Essential hypertension  -     amLODIPine (NORVASC) 5 MG tablet; TAKE 1 TABLET BY MOUTH DAILY, Disp-90 tablet, R-1Normal  -     hydrALAZINE (APRESOLINE) 25 MG tablet; TAKE 1 TABLET BY MOUTH EVERY 8 HOURS, Disp-270 tablet, R-1Normal  -     metoprolol succinate (TOPROL XL) 50 MG extended release tablet; TAKE 1 TABLET BY MOUTH EVERY NIGHT, Disp-90 tablet, R-1Normal  -     potassium chloride (KLOR-CON M) 10 MEQ extended release tablet; TAKE 1 TABLET BY MOUTH THREE TIMES DAILY, Disp-270 tablet, R-1Normal  -     CBC; Future  -     Comprehensive Metabolic Panel; Future  Mixed hyperlipidemia  -     simvastatin (ZOCOR) 40 MG tablet; TAKE 1 TABLET BY MOUTH AT BEDTIME, Disp-90 tablet, R-1Normal  -     Comprehensive Metabolic Panel; Future  -     Lipid, Fasting; Future  -     TSH with Reflex; Future  Hyperlipidemia, unspecified hyperlipidemia type  -     simvastatin (ZOCOR) 40 MG tablet; TAKE 1 TABLET BY MOUTH AT BEDTIME, Disp-90 tablet, R-1Normal  -  Comprehensive Metabolic Panel; Future  -     Lipid, Fasting; Future  -     TSH with Reflex; Future  Hyperglycemia  -     Hemoglobin A1C; Future  Prostate CA (HCC)  -     PSA, Prostatic Specific Antigen [facility]); Future  Vitamin D deficiency  -     Vitamin D 25 Hydroxy; Future  Preventative health care  -     HIV Screen; Future          Return in about 6 months (around 12/10/2023) for Chronic Conditions.     Personalized Preventive Plan  Current Health Maintenance Status  Immunization History   Administered Date(s) Administered    COVID-19, PFIZER Bivalent, DO NOT Dilute, (age 12y+), IM, 30 mcg/0.3 mL 05/26/2021    COVID-19, PFIZER GRAY top, DO NOT Dilute, (age 55 y+), IM, 30 mcg/0.3 mL 11/18/2020    COVID-19, PFIZER PURPLE top, DILUTE for use, (age 870 y+), 22mcg/0.3mL 10/26/2019, 11/16/2019    COVID-19, PFIZER, (age  12y+), IM, 30mcg/0.3mL 10/13/2022    Influenza 06/12/2010    Influenza, AFLURIA (age 3 y+), FLUZONE, (age 879 mo+), Quadv MDV, 0.51mL 05/21/2019    Influenza, FLUARIX, FLULAVAL, FLUZONE (age 31 mo+) and AFLURIA, (age 27 y+), Quadv PF, 0.68mL 04/28/2015, 04/17/2016, 03/27/2018    Influenza, FLUBLOK, (age 24 y+), Quadv PF, 0.33mL 05/26/2017    Influenza, FLUCELVAX, (age 87 mo+), MDCK, Quadv PF, 0.57mL 05/23/2020    Influenza, Intradermal, Preservative free 05/08/2011, 05/15/2012, 05/19/2013, 06/01/2014    TDaP, ADACEL (age 92y-64y), BOOSTRIX (age 10y+), IM, 0.70mL 06/12/2010        Health Maintenance Due   Topic Date Due    HIV screen  Never done    Shingles vaccine (1 of 2) Never done    DTaP/Tdap/Td vaccine (2 - Td or Tdap) 06/12/2020    Respiratory Syncytial Virus (RSV) Pregnant or age 1 yrs+ (1 - 1-dose 60+ series) Never done    Flu vaccine (1) 03/06/2023     Recommendations for Preventive Services Due: see orders and patient instructions/AVS.

## 2023-06-13 LAB — COMPREHENSIVE METABOLIC PANEL
ALT: 33 U/L (ref 10–40)
AST: 102 U/L — ABNORMAL HIGH (ref 15–37)
Albumin/Globulin Ratio: 0.9 — ABNORMAL LOW (ref 1.1–2.2)
Albumin: 4.1 g/dL (ref 3.4–5.0)
Alkaline Phosphatase: 153 U/L — ABNORMAL HIGH (ref 40–129)
Anion Gap: 10 (ref 3–16)
BUN: 3 mg/dL — ABNORMAL LOW (ref 7–20)
CO2: 23 mmol/L (ref 21–32)
Calcium: 8.9 mg/dL (ref 8.3–10.6)
Chloride: 103 mmol/L (ref 99–110)
Creatinine: 0.8 mg/dL (ref 0.8–1.3)
Est, Glom Filt Rate: >90 (ref >60)
Glucose: 100 mg/dL — ABNORMAL HIGH (ref 70–99)
Potassium: 4.9 mmol/L (ref 3.5–5.1)
Sodium: 136 mmol/L (ref 136–145)
Total Bilirubin: 0.7 mg/dL (ref 0.0–1.0)
Total Protein: 8.9 g/dL — ABNORMAL HIGH (ref 6.4–8.2)

## 2023-06-13 LAB — CBC
Hematocrit: 37.9 % — ABNORMAL LOW (ref 40.5–52.5)
Hemoglobin: 12.8 g/dL — ABNORMAL LOW (ref 13.5–17.5)
MCH: 35.3 pg — ABNORMAL HIGH (ref 26.0–34.0)
MCHC: 33.7 g/dL (ref 31.0–36.0)
MCV: 104.8 fL — ABNORMAL HIGH (ref 80.0–100.0)
MPV: 8.8 fL (ref 5.0–10.5)
Platelets: 165 10*3/uL (ref 135–450)
RBC: 3.61 M/uL — ABNORMAL LOW (ref 4.20–5.90)
RDW: 13.7 % (ref 12.4–15.4)
WBC: 8 10*3/uL (ref 4.0–11.0)

## 2023-06-13 LAB — LIPID, FASTING
Cholesterol, Fasting: 106 mg/dL (ref 0–199)
HDL: 34 mg/dL — ABNORMAL LOW (ref 40–60)
LDL Cholesterol: 50 mg/dL (ref <100)
Triglyceride, Fasting: 112 mg/dL (ref 0–150)
VLDL Cholesterol Calculated: 22 mg/dL

## 2023-06-13 LAB — TSH WITH REFLEX: TSH: 2.46 u[IU]/mL (ref 0.27–4.20)

## 2023-06-13 LAB — VITAMIN D 25 HYDROXY: Vit D, 25-Hydroxy: 30.9 ng/mL (ref >=30)

## 2023-06-13 LAB — HEMOGLOBIN A1C
Estimated Avg Glucose: 122.6 mg/dL
Hemoglobin A1C: 5.9 %

## 2023-06-13 LAB — HIV SCREEN
HIV ANTIGEN: NONREACTIVE
HIV Ag/Ab: NONREACTIVE
HIV-1 Antibody: NONREACTIVE
HIV-2 Ab: NONREACTIVE

## 2023-06-13 LAB — PSA PROSTATIC SPECIFIC ANTIGEN: PSA: <0.02 ng/mL (ref 0.00–4.00)

## 2023-09-19 NOTE — Telephone Encounter (Signed)
Pts wife dropped off FMLA paperwork. States she has filled out most of everything and just needs signed off by Dr. Wilson Singer. Return date for FMLA is 09/29/23. Paperwork is inside a red, white and blue USPS envelope that states priority mail on it in red. Envelope will be placed in mail box by [employer].       Please fax to highlighted number on the forms.    Please make a copy once signed and call wife for pick up at [phone].

## 2023-09-22 NOTE — Telephone Encounter (Signed)
LVM to CB, we need a new form. This one was filled out incorrectly by the patient.

## 2023-09-23 NOTE — Telephone Encounter (Signed)
Pt is emailing forms to [facility]

## 2023-10-10 NOTE — Telephone Encounter (Signed)
Form completed and sent back

## 2023-12-10 ENCOUNTER — Ambulatory Visit
Admit: 2023-12-10 | Discharge: 2023-12-10 | Payer: BLUE CROSS/BLUE SHIELD | Attending: Internal Medicine | Primary: Internal Medicine

## 2023-12-10 VITALS — BP 130/80 | HR 81 | Temp 98.30000°F | Wt 235.6 lb

## 2023-12-10 DIAGNOSIS — R739 Hyperglycemia, unspecified: Secondary | ICD-10-CM

## 2023-12-10 MED ORDER — POTASSIUM CHLORIDE CRYS ER 10 MEQ PO TBCR
10 | ORAL_TABLET | ORAL | 1 refills | 30.00000 days | Status: DC
Start: 2023-12-10 — End: 2024-07-26

## 2023-12-10 MED ORDER — COLCHICINE 0.6 MG PO TABS
0.6 | ORAL_TABLET | ORAL | 1 refills | 15.00000 days | Status: DC
Start: 2023-12-10 — End: 2024-07-26

## 2023-12-10 MED ORDER — ALLOPURINOL 300 MG PO TABS
300 | ORAL_TABLET | Freq: Every day | ORAL | 1 refills | 90.00000 days | Status: DC
Start: 2023-12-10 — End: 2024-07-26

## 2023-12-10 MED ORDER — SIMVASTATIN 40 MG PO TABS
40 | ORAL_TABLET | ORAL | 1 refills | 90.00000 days | Status: DC
Start: 2023-12-10 — End: 2024-07-26

## 2023-12-10 MED ORDER — METOPROLOL SUCCINATE ER 50 MG PO TB24
50 | ORAL_TABLET | ORAL | 1 refills | 90.00000 days | Status: DC
Start: 2023-12-10 — End: 2024-07-26

## 2023-12-10 MED ORDER — HYDRALAZINE HCL 25 MG PO TABS
25 | ORAL_TABLET | ORAL | 1 refills | 30.00000 days | Status: DC
Start: 2023-12-10 — End: 2024-07-26

## 2023-12-10 MED ORDER — AMLODIPINE BESYLATE 5 MG PO TABS
5 | ORAL_TABLET | ORAL | 1 refills | 30.00000 days | Status: DC
Start: 2023-12-10 — End: 2024-07-26

## 2023-12-10 NOTE — Progress Notes (Signed)
 [facility]  Follow-up visit   12/10/2023    Phillip Harper. (DOB:  [birth year]) is a 63 y.o. male, here for follow-up:    Chief Complaint   Patient presents with    Follow-up Chronic Condition        HPI    Hyperlipidemia  This is a chronic problem. The current episode started more than 1 year ago. The problem is controlled. Last lipid tests were reviewed and are normal, other than low HDL cholesterol. There are no known factors aggravating his hyperlipidemia. Current antihyperlipidemic treatment includes diet change, exercise and statins. The current treatment provides significant improvement of lipids. There are no compliance problems.  Risk factors for coronary artery disease include dyslipidemia, hypertension, male sex and obesity.   Hypertension  This is a chronic problem. The current episode started more than 1 year ago. The problem is unchanged. The problem is controlled. Associated symptoms include anxiety. Risk factors for coronary artery disease include dyslipidemia, male gender and obesity. Current treatments include lifestyle changes, beta blockers, hydralazine , calcium channel blockers. The current treatment provides significant improvement. There are no compliance problems.   Gout  This is a chronic problem. The current episode started more than 1 year ago. The problem is controlled.  Current therapies include lifestyle modification as well as allopurinol  300 mg p.o. daily.  Has colchicine  to use as needed for attacks, but has not had to use this recently.  Vitamin D  deficiency  This is a chronic problem. The current episode started more than 1 year ago. The problem is controlled.  Current therapies include lifestyle modification as well as vitamin D  supplementation..    Prostate cancer  Patient had Gleason 3+4= 7 disease and is status post robotic prostatectomy by Dr. Malen Scudder 12/2014.  PSA has been undetectable since that time.  Lymphocytic colitis  This was diagnosed initially in 2018.   He has had colonoscopies in 07/07/19/2018, 08/2017, and 01/2020.  He was seen by Karin Ours, MD.  His last colonoscopy was done by [facility].  He was on Entocort, but weaned off of this.  Currently his symptoms are well controlled.    ROS  Review of Systems   Constitutional:  Negative for activity change, appetite change, chills, diaphoresis, fatigue, fever and unexpected weight change.   HENT:  Negative for sinus pressure, sinus pain, sore throat, trouble swallowing and voice change.    Eyes:  Negative for visual disturbance.   Respiratory:  Negative for cough, chest tightness, shortness of breath and wheezing.    Cardiovascular:  Negative for chest pain, palpitations and leg swelling.   Gastrointestinal:  Negative for abdominal distention, abdominal pain, blood in stool, constipation, diarrhea, nausea and vomiting.   Endocrine: Negative for polydipsia and polyphagia.   Genitourinary:  Negative for decreased urine volume, difficulty urinating, dysuria and urgency.   Musculoskeletal:  Negative for back pain, gait problem, joint swelling and myalgias.   Neurological:  Negative for dizziness, seizures, syncope, light-headedness and headaches.   Psychiatric/Behavioral:  Negative for agitation, behavioral problems, confusion and suicidal ideas.        HISTORIES  Current Outpatient Medications on File Prior to Visit   Medication Sig Dispense Refill    Loperamide HCl (IMODIUM A-D PO) Take 2 tablets by mouth daily as needed       Vitamin D  (CHOLECALCIFEROL) 1000 UNITS CAPS capsule Take 2 capsules by mouth daily       No current facility-administered medications on file prior to visit.  Allergies   Allergen Reactions    Shellfish-Derived Products Swelling     ORAL EDEMA, ORAL ITCHING     Past Medical History:   Diagnosis Date    Alcoholism in recovery (HCC) 08/01/2017    Cancer (HCC) 2016    Prostate Dr Beulah Brunt    Clostridium difficile infection 08/01/2010    Gout     HTN (hypertension)     Hyperlipidemia     Keloids  12/2003    multiple keloids on head    Pancreatitis, 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    Vitamin D  deficiency      Patient Active Problem List   Diagnosis    Gout    Hyperlipidemia    HTN (hypertension)    Vitamin D  deficiency    Alcohol abuse    Lymphocytic colitis    Prostate CA (HCC)    Acne keloidalis nuchae    Hyperglycemia    Abnormal LFTs    Acute pain of left knee    Severe obesity (BMI 35.0-39.9) with comorbidity [facility])     Past Surgical History:   Procedure Laterality Date    COLONOSCOPY  02/04/13    Dr Rachell Budge - normal repeat 2024    COLONOSCOPY N/A 07/14/2017    COLONOSCOPY WITH BIOPSY performed by Duwaine Gins, MD at [facility] ASC ENDOSCOPY    LAPAROSCOPIC APPENDECTOMY N/A 09/11/2017    LAPAROSCOPIC APPENDECTOMY performed by Hermon Lor, MD at [facility] OR    OTHER SURGICAL HISTORY      KELOID SURGERY    PROSTATE BIOPSY      PROSTATECTOMY Bilateral 12/20/14    robotic assisted laparoscopic    UPPER GASTROINTESTINAL ENDOSCOPY N/A 07/23/2017    EGD WITH ESOPHAGEAL ENDOSCOPIC ULTRASOUND performed by Kerman Peck, MD at [facility] ENDOSCOPY    UPPER GASTROINTESTINAL ENDOSCOPY N/A 07/23/2017    EGD BIOPSY performed by Kerman Peck, MD at [facility] ASC ENDOSCOPY     Social History     Socioeconomic History    Marital status: Legally Separated     Spouse name: Not on file    Number of children: Not on file    Years of education: Not on file    Highest education level: Not on file   Occupational History    Not on file   Tobacco Use    Smoking status: Never     Passive exposure: Never    Smokeless tobacco: Never   Vaping Use    Vaping status: Never Used   Substance and Sexual Activity    Alcohol use: No     Alcohol/week: 0.0 standard drinks of alcohol     Comment: SOBER SINCE 08-01-17    Drug use: No    Sexual activity: Yes     Partners: Female   Other Topics Concern    Not on file   Social History Narrative    Not on file     Social Drivers of Health     Financial Resource Strain: Low Risk  (12/09/2022)    Overall Financial  Resource Strain (CARDIA)     Difficulty of Paying Living Expenses: Not hard at all   Food Insecurity: No Food Insecurity (12/07/2023)    Hunger Vital Sign     Worried About Running Out of Food in the Last Year: Never true     Ran Out of Food in the Last Year: Never true   Transportation Needs: No Transportation Needs (12/07/2023)    PRAPARE - Transportation     Lack  of Transportation (Medical): No     Lack of Transportation (Non-Medical): No   Physical Activity: Not on file   Stress: Not on file   Social Connections: Not on file   Intimate Partner Violence: Not on file   Housing Stability: Low Risk  (12/07/2023)    Housing Stability Vital Sign     Unable to Pay for Housing in the Last Year: No     Number of Times Moved in the Last Year: 0     Homeless in the Last Year: No      Family History   Problem Relation Age of Onset    Cancer Mother         lung CA    Heart Disease Father         CAD    Cancer Sister         lung CA       PE  Vitals:    12/10/23 1504   BP: 130/80   Pulse: 81   Temp: 98.3 F (36.8 C)   SpO2: 95%   Weight: 106.9 kg (235 lb 9.6 oz)     Estimated body mass index is 34.79 kg/m as calculated from the following:    Height as of 06/12/23: 1.753 m (5\' 9" ).    Weight as of this encounter: 106.9 kg (235 lb 9.6 oz).    Physical Exam  Vitals reviewed.   Constitutional:       General: He is not in acute distress.     Appearance: Normal appearance.   HENT:      Head: Normocephalic and atraumatic.      Mouth/Throat:      Pharynx: Oropharynx is clear.   Eyes:      Conjunctiva/sclera: Conjunctivae normal.      Pupils: Pupils are equal, round, and reactive to light.   Cardiovascular:      Rate and Rhythm: Normal rate and regular rhythm.      Pulses: Normal pulses.      Heart sounds: Normal heart sounds.   Pulmonary:      Effort: Pulmonary effort is normal. No respiratory distress.      Breath sounds: Normal breath sounds. No wheezing or rales.   Abdominal:      Palpations: Abdomen is soft.      Tenderness: There is  no abdominal tenderness. There is no rebound.   Musculoskeletal:         General: No signs of injury. Normal range of motion.      Cervical back: Normal range of motion and neck supple.   Skin:     General: Skin is warm and dry.      Coloration: Skin is not jaundiced.   Neurological:      General: No focal deficit present.      Mental Status: He is alert and oriented to person, place, and time.   Psychiatric:         Behavior: Behavior normal.         Thought Content: Thought content normal.         Judgment: Judgment normal.         ASSESSMENT/ PLAN:  1. Gout of left foot, unspecified cause, unspecified chronicity  - allopurinol  (ZYLOPRIM ) 300 MG tablet; Take 1 tablet by mouth daily  Dispense: 90 tablet; Refill: 1  - colchicine  (COLCRYS ) 0.6 MG tablet; 1 po prn for gout attack may repeat in 2 hours no more than 2/24 hrs  Dispense: 10  tablet; Refill: 1    2. Prostate CA (HCC)  - PSA, Prostatic Specific Antigen [facility]); Future    3. Essential hypertension  - amLODIPine  (NORVASC ) 5 MG tablet; TAKE 1 TABLET BY MOUTH DAILY  Dispense: 90 tablet; Refill: 1  - hydrALAZINE  (APRESOLINE ) 25 MG tablet; TAKE 1 TABLET BY MOUTH EVERY 8 HOURS  Dispense: 270 tablet; Refill: 1  - metoprolol  succinate (TOPROL  XL) 50 MG extended release tablet; TAKE 1 TABLET BY MOUTH EVERY NIGHT  Dispense: 90 tablet; Refill: 1  - potassium chloride  (KLOR-CON  M) 10 MEQ extended release tablet; TAKE 1 TABLET BY MOUTH THREE TIMES DAILY  Dispense: 270 tablet; Refill: 1  - Comprehensive Metabolic Panel; Future  - CBC; Future    4. Mixed hyperlipidemia  - simvastatin  (ZOCOR ) 40 MG tablet; TAKE 1 TABLET BY MOUTH AT BEDTIME  Dispense: 90 tablet; Refill: 1  - Comprehensive Metabolic Panel; Future  - Lipid, Fasting; Future  - TSH reflex to FT4; Future    5. Hyperlipidemia, unspecified hyperlipidemia type  - simvastatin  (ZOCOR ) 40 MG tablet; TAKE 1 TABLET BY MOUTH AT BEDTIME  Dispense: 90 tablet; Refill: 1    6. Hyperglycemia  - Hemoglobin A1C; Future        Orders Placed This Encounter   Procedures    Comprehensive Metabolic Panel    Lipid, Fasting    TSH reflex to FT4    PSA, Prostatic Specific Antigen ([place])    CBC    Hemoglobin A1C      Orders Placed This Encounter   Medications    allopurinol  (ZYLOPRIM ) 300 MG tablet     Sig: Take 1 tablet by mouth daily     Dispense:  90 tablet     Refill:  1    amLODIPine  (NORVASC ) 5 MG tablet     Sig: TAKE 1 TABLET BY MOUTH DAILY     Dispense:  90 tablet     Refill:  1    colchicine  (COLCRYS ) 0.6 MG tablet     Sig: 1 po prn for gout attack may repeat in 2 hours no more than 2/24 hrs     Dispense:  10 tablet     Refill:  1    hydrALAZINE  (APRESOLINE ) 25 MG tablet     Sig: TAKE 1 TABLET BY MOUTH EVERY 8 HOURS     Dispense:  270 tablet     Refill:  1    metoprolol  succinate (TOPROL  XL) 50 MG extended release tablet     Sig: TAKE 1 TABLET BY MOUTH EVERY NIGHT     Dispense:  90 tablet     Refill:  1    simvastatin  (ZOCOR ) 40 MG tablet     Sig: TAKE 1 TABLET BY MOUTH AT BEDTIME     Dispense:  90 tablet     Refill:  1    potassium chloride  (KLOR-CON  M) 10 MEQ extended release tablet     Sig: TAKE 1 TABLET BY MOUTH THREE TIMES DAILY     Dispense:  270 tablet     Refill:  1      Medications Discontinued During This Encounter   Medication Reason    allopurinol  (ZYLOPRIM ) 300 MG tablet REORDER    amLODIPine  (NORVASC ) 5 MG tablet REORDER    colchicine  (COLCRYS ) 0.6 MG tablet REORDER    hydrALAZINE  (APRESOLINE ) 25 MG tablet REORDER    metoprolol  succinate (TOPROL  XL) 50 MG extended release tablet REORDER    potassium chloride  (KLOR-CON  M)  10 MEQ extended release tablet REORDER    simvastatin  (ZOCOR ) 40 MG tablet REORDER        Return in about 6 months (around 06/11/2024) for Annual physical.    Capri Veals, MD    This dictation was generated by voice recognition computer software.  Although all attempts are made to edit the dictation for accuracy, there may be errors in the transcription that are not intended.

## 2024-06-09 ENCOUNTER — Encounter

## 2024-06-10 LAB — LIPID, FASTING
Cholesterol, Fasting: 97 mg/dL (ref 0–199)
HDL: 32 mg/dL — ABNORMAL LOW (ref 40–60)
LDL Cholesterol: 52 mg/dL (ref <100)
Triglyceride, Fasting: 67 mg/dL (ref 0–150)
VLDL Cholesterol Calculated: 13 mg/dL

## 2024-06-10 LAB — COMPREHENSIVE METABOLIC PANEL
ALT: 36 U/L (ref 10–40)
AST: 118 U/L — ABNORMAL HIGH (ref 15–37)
Albumin/Globulin Ratio: 0.7 — ABNORMAL LOW (ref 1.1–2.2)
Albumin: 3.8 g/dL (ref 3.4–5.0)
Alkaline Phosphatase: 140 U/L — ABNORMAL HIGH (ref 40–129)
Anion Gap: 12 (ref 3–16)
BUN: 4 mg/dL — ABNORMAL LOW (ref 7–20)
CO2: 22 mmol/L (ref 21–32)
Calcium: 9 mg/dL (ref 8.3–10.6)
Chloride: 101 mmol/L (ref 99–110)
Creatinine: 0.7 mg/dL — ABNORMAL LOW (ref 0.8–1.3)
Est, Glom Filt Rate: >90
Glucose: 126 mg/dL — ABNORMAL HIGH (ref 70–99)
Potassium: 4.6 mmol/L (ref 3.5–5.1)
Sodium: 135 mmol/L — ABNORMAL LOW (ref 136–145)
Total Bilirubin: 0.8 mg/dL (ref 0.0–1.0)
Total Protein: 9.5 g/dL — ABNORMAL HIGH (ref 6.4–8.2)

## 2024-06-10 LAB — CBC
Hematocrit: 38.5 % — ABNORMAL LOW (ref 40.5–52.5)
Hemoglobin: 12.9 g/dL — ABNORMAL LOW (ref 13.5–17.5)
MCH: 35.5 pg — ABNORMAL HIGH (ref 26.0–34.0)
MCHC: 33.5 g/dL (ref 31.0–36.0)
MCV: 105.8 fL — ABNORMAL HIGH (ref 80.0–100.0)
MPV: 9.3 fL (ref 5.0–10.5)
Platelets: 160 K/uL (ref 135–450)
RBC: 3.64 M/uL — ABNORMAL LOW (ref 4.20–5.90)
RDW: 13.8 % (ref 12.4–15.4)
WBC: 7.4 K/uL (ref 4.0–11.0)

## 2024-06-10 LAB — TSH REFLEX TO FT4: TSH Reflex FT4: 1.58 u[IU]/mL (ref 0.27–4.20)

## 2024-06-10 LAB — HEMOGLOBIN A1C
Estimated Avg Glucose: 119.8 mg/dL
Hemoglobin A1C: 5.8 %

## 2024-06-10 LAB — PSA PROSTATIC SPECIFIC ANTIGEN: PSA: <0.02 ng/mL (ref 0.00–4.00)

## 2024-06-14 ENCOUNTER — Encounter: Payer: BLUE CROSS/BLUE SHIELD | Attending: Internal Medicine | Primary: Internal Medicine

## 2024-07-26 ENCOUNTER — Ambulatory Visit
Admit: 2024-07-26 | Discharge: 2024-07-26 | Payer: BLUE CROSS/BLUE SHIELD | Attending: Internal Medicine | Primary: Internal Medicine

## 2024-07-26 VITALS — BP 124/82 | HR 81 | Temp 98.20000°F | Ht 69.0 in | Wt 227.0 lb

## 2024-07-26 DIAGNOSIS — Z0000 Encounter for general adult medical examination without abnormal findings: Principal | ICD-10-CM

## 2024-07-26 MED ORDER — ALLOPURINOL 300 MG PO TABS
300 | ORAL_TABLET | Freq: Every day | ORAL | 1 refills | 90.00000 days | Status: AC
Start: 2024-07-26 — End: ?

## 2024-07-26 MED ORDER — SIMVASTATIN 40 MG PO TABS
40 | ORAL_TABLET | ORAL | 1 refills | 90.00000 days | Status: AC
Start: 2024-07-26 — End: ?

## 2024-07-26 MED ORDER — SIMVASTATIN 40 MG PO TABS
40 | ORAL_TABLET | ORAL | 1 refills | 90.00000 days | Status: DC
Start: 2024-07-26 — End: 2024-07-26

## 2024-07-26 MED ORDER — POTASSIUM CHLORIDE CRYS ER 10 MEQ PO TBCR
10 | ORAL_TABLET | ORAL | 1 refills | 30.00000 days | Status: AC
Start: 2024-07-26 — End: ?

## 2024-07-26 MED ORDER — COLCHICINE 0.6 MG PO TABS
0.6 | ORAL_TABLET | ORAL | 1 refills | 15.00000 days | Status: AC
Start: 2024-07-26 — End: ?

## 2024-07-26 MED ORDER — COLCHICINE 0.6 MG PO TABS
0.6 | ORAL_TABLET | ORAL | 1 refills | 15.00000 days | Status: DC
Start: 2024-07-26 — End: 2024-07-26

## 2024-07-26 MED ORDER — HYDRALAZINE HCL 25 MG PO TABS
25 | ORAL_TABLET | ORAL | 1 refills | 30.00000 days | Status: DC
Start: 2024-07-26 — End: 2024-07-26

## 2024-07-26 MED ORDER — ALLOPURINOL 300 MG PO TABS
300 | ORAL_TABLET | Freq: Every day | ORAL | 1 refills | 90.00000 days | Status: DC
Start: 2024-07-26 — End: 2024-07-26

## 2024-07-26 MED ORDER — HYDRALAZINE HCL 25 MG PO TABS
25 | ORAL_TABLET | ORAL | 1 refills | 30.00000 days | Status: AC
Start: 2024-07-26 — End: ?

## 2024-07-26 MED ORDER — POTASSIUM CHLORIDE CRYS ER 10 MEQ PO TBCR
10 | ORAL_TABLET | ORAL | 1 refills | 30.00000 days | Status: DC
Start: 2024-07-26 — End: 2024-07-26

## 2024-07-26 MED ORDER — AMLODIPINE BESYLATE 5 MG PO TABS
5 | ORAL_TABLET | ORAL | 1 refills | 30.00000 days | Status: AC
Start: 2024-07-26 — End: ?

## 2024-07-26 MED ORDER — AMLODIPINE BESYLATE 5 MG PO TABS
5 | ORAL_TABLET | ORAL | 1 refills | 30.00000 days | Status: DC
Start: 2024-07-26 — End: 2024-07-26

## 2024-07-26 MED ORDER — METOPROLOL SUCCINATE ER 50 MG PO TB24
50 | ORAL_TABLET | ORAL | 1 refills | 90.00000 days | Status: DC
Start: 2024-07-26 — End: 2024-07-26

## 2024-07-26 MED ORDER — METOPROLOL SUCCINATE ER 50 MG PO TB24
50 | ORAL_TABLET | ORAL | 1 refills | 90.00000 days | Status: AC
Start: 2024-07-26 — End: ?

## 2024-07-26 NOTE — Progress Notes (Signed)
 "Well Adult Note  Name: Phillip Harper. Todays Date: 07/26/2024   MRN: [id number] Sex: Male   Age: 63 y.o. Ethnicity: Non-Hispanic / Non Latino   DOB: 03-31-1961 Race: Black / African American      Phillip Harper. is here for a well adult exam.       Assessment & Plan   Encounter for well adult exam without abnormal findings  Prediabetes  -     Hemoglobin A1C; Future  Essential hypertension  -     amLODIPine  (NORVASC ) 5 MG tablet; TAKE 1 TABLET BY MOUTH DAILY, Disp-90 tablet, R-1Normal  -     hydrALAZINE  (APRESOLINE ) 25 MG tablet; TAKE 1 TABLET BY MOUTH EVERY 8 HOURS, Disp-270 tablet, R-1Normal  -     metoprolol  succinate (TOPROL  XL) 50 MG extended release tablet; TAKE 1 TABLET BY MOUTH EVERY NIGHT, Disp-90 tablet, R-1Normal  -     potassium chloride  (KLOR-CON  M) 10 MEQ extended release tablet; TAKE 1 TABLET BY MOUTH THREE TIMES DAILY, Disp-270 tablet, R-1Normal  -     CBC; Future  -     Comprehensive Metabolic Panel; Future  Mixed hyperlipidemia  -     simvastatin  (ZOCOR ) 40 MG tablet; TAKE 1 TABLET BY MOUTH AT BEDTIME, Disp-90 tablet, R-1Normal  -     Comprehensive Metabolic Panel; Future  -     Lipid, Fasting; Future  Hyperlipidemia, unspecified hyperlipidemia type  -     simvastatin  (ZOCOR ) 40 MG tablet; TAKE 1 TABLET BY MOUTH AT BEDTIME, Disp-90 tablet, R-1Normal  -     Comprehensive Metabolic Panel; Future  -     Lipid, Fasting; Future  Gout of left foot, unspecified cause, unspecified chronicity  -     allopurinol  (ZYLOPRIM ) 300 MG tablet; Take 1 tablet by mouth daily, Disp-90 tablet, R-1Normal  -     colchicine  (COLCRYS ) 0.6 MG tablet; 1 po prn for gout attack may repeat in 2 hours no more than 2/24 hrs, Disp-10 tablet, R-1Normal  Chronic midline low back pain without sciatica  Need for influenza vaccination  -     Influenza, FLUCELVAX Trivalent, (age 4 mo+) IM, Preservative Free, 0.33mL    Assessment & Plan  1. Hypertension:  - Blood pressure readings are within the normal range today.  - Amlodipine ,  hydralazine , and metoprolol  are taken regularly and appear effective.  - All three blood pressure medications have been refilled and sent to the pharmacy.    2. Prediabetes:  - Hemoglobin A1c levels have been consistently in the prediabetic range since 05/2020, with a recent reading of 5.8.  - Advised to continue monitoring blood sugar levels and maintain a balanced diet and regular exercise.    3. Hyperlipidemia:  - Cholesterol levels remain stable compared to the previous year, except for a low HDL level.  - Advised to increase physical activity to help raise HDL levels.  - Cholesterol medication has been refilled and sent to the pharmacy.    4. Gout:  - Experienced gout flare-ups even while on medication.  - Refills for allopurinol  and colchicine  have been provided and sent to the pharmacy.  -Phillip Harper previously filled out a FMLA paperwork for him so that he can have time off during exacerbations.  We can fill this out in the same way for him if he desires.     5. Lower back pain:  - Tylenol  is not effective in managing lower back pain.  - Home stretching exercises have been recommended.  Referral to a spine clinic was offered but declined.  - Advised to continue using ice, heat, and Tylenol .    6. Health maintenance:  - Had two colonoscopies since turning 51, with the last one in 2018.  - Blood work has been ordered for the next visit.         Return in about 6 months (around 01/24/2025) for Chronic Conditions.       Subjective   History:    History of Present Illness  The patient presents for his annual physical and management of chronic medical conditions.    He reports no current health issues. He has been adhering to his prescribed regimen of amlodipine , hydralazine , and metoprolol  for blood pressure management. He monitors his blood pressure at home, which generally remains within normal limits, although occasional slight elevations are noted.    Blood work from 06/09/2024 indicates prediabetes, with  hemoglobin A1c levels consistently in the prediabetic range since 05/2020. He reports no symptoms such as excessive urination or increased thirst. Cholesterol levels are stable compared to a year ago, though HDL remains low. Thyroid function and prostate-specific antigen levels are normal. Blood counts are stable.    He has experienced gout flare-ups even while on medication, necessitating the use of colchicine . These episodes typically last for several days, during which he is unable to work. The most recent flare-up occurred approximately one month ago.  Phillip Harper previously filled out a FMLA paperwork for him so that he can have time off during exacerbations.  We can fill this out in the same way for him if he desires    He continues to experience lower back pain, which is not adequately managed with Tylenol . He is seeking alternative treatment options and declines referral to a spine clinic at this time.    He has undergone two colonoscopies since turning 51, with the most recent one performed in 2018.    PAST SURGICAL HISTORY:  Two colonoscopies, most recent in 2018       CHRONIC PROBLEMS  Prediabetes  This is a chronic problem present since at least October 2021.  His hemoglobin A1c has ranged between 6.1 and 5.8 since that time.  He has no polyuria, polydipsia, polyphasia.  There are no signs or symptoms of diabetes.  Hyperlipidemia  This is a chronic problem. The current episode started more than 1 year ago. The problem is controlled. Last lipid tests were reviewed and are normal, other than low HDL cholesterol. There are no known factors aggravating his hyperlipidemia. Current antihyperlipidemic treatment includes diet change, exercise and statins. The current treatment provides significant improvement of lipids. There are no compliance problems.  Risk factors for coronary artery disease include dyslipidemia, hypertension, male sex and obesity.   Hypertension  This is a chronic problem. The current  episode started more than 1 year ago. The problem is unchanged. The problem is controlled. Associated symptoms include anxiety. Risk factors for coronary artery disease include dyslipidemia, male gender and obesity. Current treatments include lifestyle changes, beta blockers, hydralazine , calcium channel blockers. The current treatment provides significant improvement. There are no compliance problems.   Gout  This is a chronic problem. The current episode started more than 1 year ago. The problem is controlled.  Current therapies include lifestyle modification as well as allopurinol  300 mg p.o. daily.  Has colchicine  to use as needed for attacks, but has not had to use this recently.  Vitamin D  deficiency  This is a chronic problem. The current  episode started more than 1 year ago. The problem is controlled.  Current therapies include lifestyle modification as well as vitamin D  supplementation..    Prostate cancer  Patient had Gleason 3+4= 7 disease and is status post robotic prostatectomy by Dr. Everardo 12/2014.  PSA has been undetectable since that time.  Lymphocytic colitis  This was diagnosed initially in 2018.  He has had colonoscopies in 07/07/19/2018, 08/2017, and 01/2020.  He was seen by Reyes Munson, MD.  His last colonoscopy was done by [facility].  He was on Entocort, but weaned off of this.  Currently his symptoms are well controlled.    Review of Systems   Constitutional:  Negative for activity change, appetite change, chills, diaphoresis, fatigue, fever and unexpected weight change.   HENT:  Negative for sinus pressure, sinus pain, sore throat, trouble swallowing and voice change.    Eyes:  Negative for visual disturbance.   Respiratory:  Negative for cough, chest tightness, shortness of breath and wheezing.    Cardiovascular:  Negative for chest pain, palpitations and leg swelling.   Gastrointestinal:  Negative for abdominal distention, abdominal pain, blood in stool, constipation, diarrhea, nausea and  vomiting.   Endocrine: Negative for polydipsia and polyphagia.   Genitourinary:  Negative for decreased urine volume, difficulty urinating, dysuria and urgency.   Musculoskeletal:  Positive for arthralgias (Gout) and back pain. Negative for gait problem, joint swelling and myalgias.   Neurological:  Negative for dizziness, seizures, syncope, light-headedness and headaches.   Psychiatric/Behavioral:  Negative for agitation, behavioral problems, confusion and suicidal ideas.        Allergies   Allergen Reactions    Shellfish Protein-Containing Drug Products Swelling     ORAL EDEMA, ORAL ITCHING     Prior to Visit Medications   Medication Sig Taking? Authorizing Provider   allopurinol  (ZYLOPRIM ) 300 MG tablet Take 1 tablet by mouth daily Yes Ranie Chinchilla, MD   amLODIPine  (NORVASC ) 5 MG tablet TAKE 1 TABLET BY MOUTH DAILY Yes Rosamae Rocque, MD   colchicine  (COLCRYS ) 0.6 MG tablet 1 po prn for gout attack may repeat in 2 hours no more than 2/24 hrs Yes Siah Kannan, MD   hydrALAZINE  (APRESOLINE ) 25 MG tablet TAKE 1 TABLET BY MOUTH EVERY 8 HOURS Yes Alinna Siple, MD   metoprolol  succinate (TOPROL  XL) 50 MG extended release tablet TAKE 1 TABLET BY MOUTH EVERY NIGHT Yes Eloisa Chokshi, MD   potassium chloride  (KLOR-CON  M) 10 MEQ extended release tablet TAKE 1 TABLET BY MOUTH THREE TIMES DAILY Yes Aleanna Menge, MD   simvastatin  (ZOCOR ) 40 MG tablet TAKE 1 TABLET BY MOUTH AT BEDTIME Yes Gerard Cantara, MD   Loperamide HCl (IMODIUM A-D PO) Take 2 tablets by mouth daily as needed  Yes [provider]   Vitamin D  (CHOLECALCIFEROL) 1000 UNITS CAPS capsule Take 2 capsules by mouth daily Yes [provider]     Past Medical History:   Diagnosis Date    Alcoholism in recovery (HCC) 08/01/2017    Cancer (HCC) 2016    Prostate Dr Marnie    Clostridium difficile infection 08/01/2010    Gout     HTN (hypertension)     Hyperlipidemia     Keloids 12/2003    multiple keloids on head    Pancreatitis, 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    Vitamin D  deficiency      Past Surgical History:   Procedure Laterality Date    COLONOSCOPY  02/04/13    Dr Gleen - normal repeat  2024    COLONOSCOPY N/A 07/14/2017    COLONOSCOPY WITH BIOPSY performed by Reyes JONETTA Munson, MD at [facility] ENDOSCOPY    LAPAROSCOPIC APPENDECTOMY N/A 09/11/2017    LAPAROSCOPIC APPENDECTOMY performed by Lynwood FORBES Coats, MD at [facility] OR    OTHER SURGICAL HISTORY      KELOID SURGERY    PROSTATE BIOPSY      PROSTATECTOMY Bilateral 12/20/14    robotic assisted laparoscopic    UPPER GASTROINTESTINAL ENDOSCOPY N/A 07/23/2017    EGD WITH ESOPHAGEAL ENDOSCOPIC ULTRASOUND performed by Donnice Rhein, MD at [facility] ENDOSCOPY    UPPER GASTROINTESTINAL ENDOSCOPY N/A 07/23/2017    EGD BIOPSY performed by Donnice Rhein, MD at [facility] ENDOSCOPY     Family History   Problem Relation Age of Onset    Cancer Mother         lung CA    Heart Disease Father         CAD    Cancer Sister         lung CA     Social History     Tobacco Use    Smoking status: Never     Passive exposure: Never    Smokeless tobacco: Never   Vaping Use    Vaping status: Never Used   Substance Use Topics    Alcohol use: No     Alcohol/week: 0.0 standard drinks of alcohol     Comment: SOBER SINCE 08-01-17    Drug use: No           Objective    Vital Signs  BP 124/82   Pulse 81   Temp 98.2 F (36.8 C)   Ht 1.753 m (5' 9)   Wt 103 kg (227 lb)   SpO2 98%   BMI 33.52 kg/m     Wt Readings from Last 3 Encounters:   07/26/24 103 kg (227 lb)   12/10/23 106.9 kg (235 lb 9.6 oz)   06/12/23 115.2 kg (254 lb)       Physical Exam  Vitals reviewed.   Constitutional:       General: He is not in acute distress.     Appearance: Normal appearance.   HENT:      Head: Normocephalic and atraumatic.      Mouth/Throat:      Pharynx: Oropharynx is clear.   Eyes:      Conjunctiva/sclera: Conjunctivae normal.      Pupils: Pupils are equal, round, and reactive to light.   Cardiovascular:      Rate and Rhythm: Normal rate and  regular rhythm.      Pulses: Normal pulses.      Heart sounds: Normal heart sounds.   Pulmonary:      Effort: Pulmonary effort is normal. No respiratory distress.      Breath sounds: Normal breath sounds. No wheezing or rales.   Abdominal:      Palpations: Abdomen is soft.      Tenderness: There is no abdominal tenderness. There is no rebound.   Musculoskeletal:         General: No signs of injury. Normal range of motion.      Cervical back: Normal range of motion and neck supple.   Skin:     General: Skin is warm and dry.      Coloration: Skin is not jaundiced.   Neurological:      General: No focal deficit present.      Mental Status: He  is alert and oriented to person, place, and time.   Psychiatric:         Behavior: Behavior normal.         Thought Content: Thought content normal.         Judgment: Judgment normal.             Personalized Preventive Plan  Current Health Maintenance Status  Immunization History   Administered Date(s) Administered    COVID-19, COMIRNATY [employer]), (age 12y+), IM, 30mcg/0.3mL 10/13/2022    COVID-19, Inactive, PFIZER Bivalent, DO NOT Dilute, (age 12y+) 05/26/2021    COVID-19, Inactive, PFIZER GRAY top, DO NOT Dilute, (age 19 y+) 11/18/2020    COVID-19, Inactive, PFIZER PURPLE top, DILUTE for use, (age 31 y+) 10/26/2019, 11/16/2019    Influenza 06/12/2010    Influenza, AFLURIA (age 53 y+), FLUZONE, (age 2 mo+), Quadv MDV, 0.77mL 05/21/2019    Influenza, FLUARIX, FLULAVAL, FLUZONE (age 48 mo+) and AFLURIA, (age 273 y+), Quadv PF, 0.19mL 04/28/2015, 04/17/2016, 03/27/2018, 10/13/2022    Influenza, FLUBLOK, (age 36 y+), Quadv PF, 0.41mL 05/26/2017    Influenza, FLUCELVAX, (age 27 mo+) IM, Trivalent PF, 0.5mL 06/12/2023, 07/26/2024    Influenza, FLUCELVAX, (age 27 mo+), MDCK, Quadv PF, 0.39mL 05/23/2020    Influenza, Intradermal, Preservative free 05/08/2011, 05/15/2012, 05/19/2013, 06/01/2014    TDaP, ADACEL (age 85y-64y), BOOSTRIX (age 10y+), IM, 0.5mL 06/12/2010, 06/12/2023        Health  Maintenance Due   Topic Date Due    Shingles vaccine (1 of 2) Never done    Pneumococcal 50+ years Vaccine (1 of 1 - PCV) Never done    Respiratory Syncytial Virus (RSV) Pregnant or age 77 yrs+ (1 - Risk 60-74 years 1-dose series) Never done    COVID-19 Vaccine (6 - 2025-26 season) 04/05/2024      Recommendations for Preventive Services Due: see orders and patient instructions/AVS.  "

## 2024-07-26 NOTE — Patient Instructions (Signed)
"       Well Visit, Ages 25 to 52: Care Instructions  Well visits can help you stay healthy. Your doctor has checked your overall health and may have suggested ways to take good care of yourself. Your doctor also may have recommended tests. You can help prevent illness with healthy eating, good sleep, vaccinations, regular exercise, and other steps.    Get the tests that you and your doctor decide on. Depending on your age and risks, examples might include screening for diabetes; hepatitis C; HIV; and cervical, breast, lung, and colon cancer. Screening helps find diseases before any symptoms appear.   Eat healthy foods. Choose fruits, vegetables, whole grains, lean protein, and low-fat dairy foods. Limit saturated fat and reduce salt.     Limit alcohol. Men should have no more than 2 drinks a day. Women should have no more than 1. For some people, no alcohol is the best choice.   Exercise. Get at least 30 minutes of exercise on most days of the week. Walking can be a good choice.     Reach and stay at your healthy weight. This will lower your risk for many health problems.   Take care of your mental health. Try to stay connected with friends, family, and community, and find ways to manage stress.     If you're feeling depressed or hopeless, talk to someone. A counselor can help. If you don't have a counselor, talk to your doctor.   Talk to your doctor if you think you may have a problem with alcohol or drug use. This includes prescription medicines, marijuana, and other drugs.     Avoid tobacco and nicotine: Don't smoke, vape, or chew. If you need help quitting, talk to your doctor.   Practice safer sex. Getting tested, using condoms or dental dams, and limiting sex partners can help prevent STIs.     Use birth control if it's important to you to prevent pregnancy. Talk with your doctor about your choices and what might be best for you.   Prevent problems where you can. Protect your skin from too much sun, wash your  hands, brush your teeth twice a day, and wear a seat belt in the car.   Where can you learn more?  Go to [employer] and enter P072 to learn more about Well Visit, Ages 62 to 71: Care Instructions.  Current as of: February 03, 2024  Content Version: 14.6   2024-2025 [employer], .   Care instructions adapted under license by [facility]. If you have questions about a medical condition or this instruction, always ask your healthcare professional. Romayne Alderman, [facility], disclaims any warranty or liability for your use of this information.    "
